# Patient Record
Sex: Female | Born: 1935 | Hispanic: No | Smoking: Never smoker
Health system: Southern US, Community
[De-identification: ages and names within clinical notes are randomized; demographics above are authoritative.]

## PROBLEM LIST (undated history)

## (undated) DIAGNOSIS — M5430 Sciatica, unspecified side: Secondary | ICD-10-CM

## (undated) DIAGNOSIS — E560 Deficiency of vitamin E: Secondary | ICD-10-CM

## (undated) DIAGNOSIS — K5909 Other constipation: Secondary | ICD-10-CM

## (undated) DIAGNOSIS — I214 Non-ST elevation (NSTEMI) myocardial infarction: Secondary | ICD-10-CM

## (undated) DIAGNOSIS — I10 Essential (primary) hypertension: Secondary | ICD-10-CM

## (undated) DIAGNOSIS — C189 Malignant neoplasm of colon, unspecified: Secondary | ICD-10-CM

## (undated) DIAGNOSIS — H269 Unspecified cataract: Secondary | ICD-10-CM

## (undated) DIAGNOSIS — Z66 Do not resuscitate: Secondary | ICD-10-CM

## (undated) DIAGNOSIS — G4762 Sleep related leg cramps: Secondary | ICD-10-CM

## (undated) DIAGNOSIS — E538 Deficiency of other specified B group vitamins: Secondary | ICD-10-CM

## (undated) DIAGNOSIS — I059 Rheumatic mitral valve disease, unspecified: Secondary | ICD-10-CM

## (undated) DIAGNOSIS — I5032 Chronic diastolic (congestive) heart failure: Secondary | ICD-10-CM

## (undated) DIAGNOSIS — N183 Chronic kidney disease, stage 3 unspecified: Secondary | ICD-10-CM

## (undated) DIAGNOSIS — C569 Malignant neoplasm of unspecified ovary: Secondary | ICD-10-CM

## (undated) DIAGNOSIS — E871 Hypo-osmolality and hyponatremia: Secondary | ICD-10-CM

## (undated) DIAGNOSIS — E785 Hyperlipidemia, unspecified: Secondary | ICD-10-CM

## (undated) DIAGNOSIS — R609 Edema, unspecified: Secondary | ICD-10-CM

## (undated) DIAGNOSIS — M47817 Spondylosis without myelopathy or radiculopathy, lumbosacral region: Secondary | ICD-10-CM

## (undated) DIAGNOSIS — I4891 Unspecified atrial fibrillation: Secondary | ICD-10-CM

## (undated) DIAGNOSIS — M545 Low back pain, unspecified: Secondary | ICD-10-CM

## (undated) DIAGNOSIS — Z85528 Personal history of other malignant neoplasm of kidney: Secondary | ICD-10-CM

## (undated) DIAGNOSIS — D696 Thrombocytopenia, unspecified: Secondary | ICD-10-CM

## (undated) DIAGNOSIS — E559 Vitamin D deficiency, unspecified: Secondary | ICD-10-CM

## (undated) DIAGNOSIS — M1990 Unspecified osteoarthritis, unspecified site: Secondary | ICD-10-CM

## (undated) DIAGNOSIS — M8580 Other specified disorders of bone density and structure, unspecified site: Secondary | ICD-10-CM

## (undated) DIAGNOSIS — R32 Unspecified urinary incontinence: Secondary | ICD-10-CM

## (undated) DIAGNOSIS — H9190 Unspecified hearing loss, unspecified ear: Secondary | ICD-10-CM

## (undated) DIAGNOSIS — R209 Unspecified disturbances of skin sensation: Secondary | ICD-10-CM

## (undated) HISTORY — DX: Unspecified cataract: H26.9

## (undated) HISTORY — DX: Essential (primary) hypertension: I10

## (undated) HISTORY — DX: Vitamin D deficiency, unspecified: E55.9

## (undated) HISTORY — DX: Morbid (severe) obesity due to excess calories: E66.01

## (undated) HISTORY — DX: Unspecified urinary incontinence: R32

## (undated) HISTORY — DX: Unspecified disturbances of skin sensation: R20.9

## (undated) HISTORY — DX: Edema, unspecified: R60.9

## (undated) HISTORY — DX: Hypo-osmolality and hyponatremia: E87.1

## (undated) HISTORY — DX: Malignant neoplasm of unspecified ovary: C56.9

## (undated) HISTORY — DX: Unspecified osteoarthritis, unspecified site: M19.90

## (undated) HISTORY — DX: Hyperlipidemia, unspecified: E78.5

## (undated) HISTORY — DX: Chronic kidney disease, stage 3 (moderate): N18.3

## (undated) HISTORY — DX: Chronic diastolic (congestive) heart failure: I50.32

## (undated) HISTORY — DX: Sleep related leg cramps: G47.62

## (undated) HISTORY — DX: Thrombocytopenia, unspecified: D69.6

## (undated) HISTORY — DX: Low back pain: M54.5

## (undated) HISTORY — DX: Chronic kidney disease, stage 3 unspecified: N18.30

## (undated) HISTORY — DX: Spondylosis without myelopathy or radiculopathy, lumbosacral region: M47.817

## (undated) HISTORY — DX: Malignant neoplasm of colon, unspecified: C18.9

## (undated) HISTORY — DX: Low back pain, unspecified: M54.50

## (undated) HISTORY — DX: Personal history of other malignant neoplasm of kidney: Z85.528

## (undated) HISTORY — DX: Other specified disorders of bone density and structure, unspecified site: M85.80

## (undated) HISTORY — DX: Unspecified hearing loss, unspecified ear: H91.90

## (undated) HISTORY — DX: Unspecified atrial fibrillation: I48.91

## (undated) HISTORY — DX: Deficiency of vitamin E: E56.0

## (undated) HISTORY — DX: Sciatica, unspecified side: M54.30

## (undated) HISTORY — DX: Rheumatic mitral valve disease, unspecified: I05.9

## (undated) HISTORY — DX: Deficiency of other specified B group vitamins: E53.8

## (undated) HISTORY — DX: Non-ST elevation (NSTEMI) myocardial infarction: I21.4

## (undated) HISTORY — DX: Other constipation: K59.09

## (undated) HISTORY — PX: ANKLE SURGERY: SHX546

## (undated) HISTORY — DX: Do not resuscitate: Z66

---
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
## 1959-09-15 DIAGNOSIS — C189 Malignant neoplasm of colon, unspecified: Secondary | ICD-10-CM

## 1959-09-15 HISTORY — DX: Malignant neoplasm of colon, unspecified: C18.9

## 1968-09-14 DIAGNOSIS — C569 Malignant neoplasm of unspecified ovary: Secondary | ICD-10-CM

## 1968-09-14 HISTORY — DX: Malignant neoplasm of unspecified ovary: C56.9

## 2001-10-24 ENCOUNTER — Other Ambulatory Visit: Admission: RE | Admit: 2001-10-24 | Discharge: 2001-10-24 | Payer: Self-pay | Admitting: Family Medicine

## 2003-09-15 DIAGNOSIS — Z85528 Personal history of other malignant neoplasm of kidney: Secondary | ICD-10-CM

## 2003-09-15 HISTORY — DX: Personal history of other malignant neoplasm of kidney: Z85.528

## 2011-04-22 ENCOUNTER — Encounter: Payer: Self-pay | Admitting: Internal Medicine

## 2011-04-22 ENCOUNTER — Ambulatory Visit (INDEPENDENT_AMBULATORY_CARE_PROVIDER_SITE_OTHER): Payer: Medicare Other | Admitting: Internal Medicine

## 2011-04-22 VITALS — BP 151/85 | HR 62 | Ht 65.0 in | Wt 193.0 lb

## 2011-04-22 DIAGNOSIS — R0609 Other forms of dyspnea: Secondary | ICD-10-CM

## 2011-04-22 DIAGNOSIS — R0683 Snoring: Secondary | ICD-10-CM | POA: Insufficient documentation

## 2011-04-22 DIAGNOSIS — R079 Chest pain, unspecified: Secondary | ICD-10-CM | POA: Insufficient documentation

## 2011-04-22 DIAGNOSIS — I480 Paroxysmal atrial fibrillation: Secondary | ICD-10-CM | POA: Insufficient documentation

## 2011-04-22 DIAGNOSIS — I4891 Unspecified atrial fibrillation: Secondary | ICD-10-CM

## 2011-04-22 MED ORDER — WARFARIN SODIUM 2.5 MG PO TABS: 2.5000 mg | ORAL_TABLET | Freq: Every day | ORAL | Status: DC

## 2011-04-22 MED ORDER — FLECAINIDE ACETATE 100 MG PO TABS: 100.0000 mg | ORAL_TABLET | Freq: Two times a day (BID) | ORAL | Status: DC

## 2011-04-22 MED ORDER — PANTOPRAZOLE SODIUM 40 MG PO TBEC: 40.0000 mg | DELAYED_RELEASE_TABLET | Freq: Every day | ORAL | Status: AC

## 2011-04-22 NOTE — Progress Notes (Signed)
HPI:  Ms. [name] is an 77 y/o woman with multiple medical problems including morbid obesity, HTN, CRI, severe hearing loss and h/o colon/kidney/ovarian CA all treated successfully with surgery. Referred by Dr. Nathanial Rancher for evaluation of new-onset AF.   Denies any h/o heart disease.  Saw Dr. Nathanial Rancher last week for sinus infection. At that time c/o palpitations and CP. ECG in office was SR. However, she had a 2 week event monitor placed yesterday and was found to have atrial fibrillation with RVR at rates ~150.   Gets around slowly with walker. Still with occasional pain in her left chest/shoulders/back. Continues with palpitations 1-2x/day. Associated with dyspnea. Snores very heavily at night. Daughter says she stops breathing frequently.   Has h/o heavy Goody Powder use for shoulder and back pain. Dr. Nathanial Rancher told her to stop taking it. Has not had h/o GIB. Switched to hydrocodone.    ROS: All other systems normal except as mentioned in HPI, past medical history and problem list.    Past Medical History  Diagnosis Date  . Thrombocytopenia   . Sleep related leg cramps   . Cough   . Unspecified hearing loss   . HTN (hypertension)   . Lumbar pain   . Osteopenia   . CKD (chronic kidney disease), stage III   . Chronic constipation   . Unspecified urinary incontinence   . Hyponatremia   . Mitral valve disorders   . Disturbance of skin sensation   . Sciatica   . Unspecified cataract   . Degeneration of intervertebral disc, site unspecified   . Lumbosacral spondylosis without myelopathy   . Edema   . HLD (hyperlipidemia)   . Other B-complex deficiencies   . Unspecified vitamin D deficiency   . Palpitations   . Dizziness and giddiness     Current Outpatient Prescriptions  Medication Sig Dispense Refill  . acetaminophen (TYLENOL) 500 MG tablet Take 500 mg by mouth every 6 (six) hours as needed.        Marland Kitchen alendronate (FOSAMAX) 10 MG tablet Take 10 mg by mouth daily before breakfast. Take  with a full glass of water on an empty stomach.       Marland Kitchen amoxicillin (AMOXIL) 500 MG capsule Take 500 mg by mouth 2 (two) times daily.        Marland Kitchen aspirin 81 MG tablet Take 81 mg by mouth daily.        . calcium-vitamin D (OSCAL WITH D) 500-200 MG-UNIT per tablet Take 1 tablet by mouth daily.        . Cholecalciferol (VITAMIN D) 400 UNITS capsule Take 400 Units by mouth daily.        Marland Kitchen docusate sodium (COLACE) 100 MG capsule Take 100 mg by mouth daily.        . fenofibrate micronized (LOFIBRA) 134 MG capsule Take 134 mg by mouth daily before breakfast.        . furosemide (LASIX) 20 MG tablet Take 20 mg by mouth daily.        Marland Kitchen HYDROcodone-acetaminophen (VICODIN) 5-500 MG per tablet Take 1 tablet by mouth every 6 (six) hours as needed.        . loratadine (CLARITIN) 10 MG tablet Take 10 mg by mouth daily.        Marland Kitchen lovastatin (MEVACOR) 40 MG tablet Take 40 mg by mouth at bedtime.        . meclizine (ANTIVERT) 25 MG tablet Take 25 mg by mouth 3 (three) times daily as  needed.        . Omega-3 Fatty Acids (FISH OIL) 1200 MG CAPS Take by mouth 3 (three) times daily.        . sodium chloride (OCEAN) 0.65 % nasal spray Place 1 spray into the nose as needed.        . verapamil (COVERA HS) 240 MG (CO) 24 hr tablet Take 240 mg by mouth at bedtime.        . vitamin B-12 (CYANOCOBALAMIN) 500 MCG tablet Take 500 mcg by mouth daily.        . vitamin C (ASCORBIC ACID) 500 MG tablet Take 500 mg by mouth daily.           No Known Allergies  History   Social History  . Marital Status: Widowed    Spouse Name: N/A    Number of Children: N/A  . Years of Education: N/A   Occupational History  . Not on file.   Social History Main Topics  . Smoking status: Never Smoker   . Smokeless tobacco: Not on file  . Alcohol Use: Not on file  . Drug Use: Not on file  . Sexually Active: Not on file   Other Topics Concern  . Not on file   Social History Narrative  . No narrative on file    No family history on  file.  PHYSICAL EXAM: Filed Vitals:   04/22/11 1056  BP: 151/85  Pulse: 62   General:  Elderly obese sitting in [place]. Very hard of hearing HEENT: normal Neck: supple. no JVD. Carotids 2+ bilat; no bruits. No lymphadenopathy or thryomegaly appreciated. Cor: PMI nonpalpable Regular rate & rhythm. No rubs, murmurs. +s4 Lungs: clear Abdomen: obese soft, nontender, nondistended.No bruits or masses. Good bowel sounds. Extremities: no cyanosis, clubbing, rash, edema Neuro: alert & oriented x 3, cranial nerves grossly intact. moves all 4 extremities w/o difficulty. Affect pleasant.   ECG: Sinus rhythm 64. LAD. LVH IVCD No ST-T wave abnormalities.    ASSESSMENT & PLAN:

## 2011-04-22 NOTE — Assessment & Plan Note (Addendum)
Patient is with symptomatic PAF with tachy-brady component. Reviewed initial holter monitor results showing rapid vetricular response ~150.  Given tachy-brady will likely not tolerate rate control strategy. Discussed rhythm control with patient and daughter and agreed to start Flecainide 100 mg BID.  Will check echo to assess EF and LA size (suspect she has significant diastolic dysfunction). Will also check lexiscan myoview to evaluate for signifcant underlying CAD.  If myoview positive will likely need to chagne flecainide to amiodarone.  She has a CHADS-VASC 9 (age, gender, HTN) therefore she will need coumadin.  R/B/I discussed with pt and her daughter, they agreed to proceed with therapy.  Discussed importance of not using Goody powders with coumadin use, she voiced understanding. Will start Protonix to help protect against GI bleeding. New patient coumadin clinic will be scheduled.  Will follow up with EP for further treatment. Refer to pulmonary for sleep study as I suspect she has severe OSA.

## 2011-04-22 NOTE — Assessment & Plan Note (Signed)
Daughter states pt snores nightly and stops breathing regularly.  She will be schedule for pulmonary consult for sleep apnea review.

## 2011-04-22 NOTE — Patient Instructions (Addendum)
Start Coumadin 2.5 mg daily Start Flecainide 100 mg Twice daily  Start Protonix 40 mg daily  Your physician has requested that you have an echocardiogram. Echocardiography is a painless test that uses sound waves to create images of your heart. It provides your doctor with information about the size and shape of your heart and how well your heart's chambers and valves are working. This procedure takes approximately one hour. There are no restrictions for this procedure.  Your physician has requested that you have a lexiscan myoview. For further information please visit [email or web address] Please follow instruction sheet, as given.  You have been referred to Coumadin Clinic, need to be seen Mon 8/13  You have been referred to Pulmonary for Sleep Eval  You have been referred to EP for further follow-up

## 2011-04-22 NOTE — Assessment & Plan Note (Addendum)
Mostly atypical but has multiple cardiac risk factors. Unable to walk on treadmill due to comorbidities. Will schedule lexiscan myoview.

## 2011-04-23 ENCOUNTER — Encounter: Payer: Self-pay | Admitting: *Deleted

## 2011-04-24 ENCOUNTER — Encounter: Payer: Self-pay | Admitting: Pulmonary Disease

## 2011-04-24 ENCOUNTER — Ambulatory Visit (INDEPENDENT_AMBULATORY_CARE_PROVIDER_SITE_OTHER): Payer: Medicare Other | Admitting: Pulmonary Disease

## 2011-04-24 DIAGNOSIS — G4733 Obstructive sleep apnea (adult) (pediatric): Secondary | ICD-10-CM | POA: Insufficient documentation

## 2011-04-24 DIAGNOSIS — Z9989 Dependence on other enabling machines and devices: Secondary | ICD-10-CM

## 2011-04-24 NOTE — Progress Notes (Signed)
  Subjective:    Patient ID: [name], female    DOB: [birth year], 77 y.o.   MRN: [id number]  HPI 77 y/o woman referred for evaluation of obstructive sleep apnea  She has  HTN, CRI, severe hearing loss and h/o colon/kidney/ovarian CA all treated successfully with surgery. Seen by Dr Gala Romney for [name] now on flecainide & verapamil & coumadin Daughter reports snoring, witnessed apneas & starts back with little noises Hydrocodone at bedtime, nocturia +, takes lasix in am, sleeps in a recliner due to a bad back x 3 yrs ESS 10 She is on a lifewatch monitor x  2 weeks Surgery at [facility] for hearing implants - was told that she was a difficult airway Bedtime 10-11 pm, minimal latency, frequent arousals, nocturia +, wakes up at 0800, keeps a headache, no dryness.   Review of Systems  Constitutional: Positive for unexpected weight change. Negative for fever.  HENT: Positive for congestion, sneezing and dental problem. Negative for ear pain, nosebleeds, sore throat, rhinorrhea, trouble swallowing, postnasal drip and sinus pressure.   Eyes: Negative for redness and itching.  Respiratory: Positive for cough and shortness of breath. Negative for chest tightness and wheezing.   Cardiovascular: Positive for chest pain and leg swelling. Negative for palpitations.  Gastrointestinal: Negative for nausea and vomiting.  Genitourinary: Negative for dysuria.  Musculoskeletal: Positive for joint swelling.  Skin: Negative for rash.  Neurological: Negative for headaches.  Hematological: Does not bruise/bleed easily.  Psychiatric/Behavioral: Negative for dysphoric mood. The patient is nervous/anxious.        Objective:   Physical Exam  Gen. Pleasant, obese, in no distress, normal affect ENT - no lesions, no post nasal drip, class 2 airway Neck: No JVD, no thyromegaly, no carotid bruits Lungs: no use of accessory muscles, no dullness to percussion, clear without rales or rhonchi  Cardiovascular: Rhythm  regular, heart sounds  normal, no murmurs or gallops, no peripheral edema Abdomen: soft and non-tender, no hepatosplenomegaly, BS normal. Musculoskeletal: No deformities, no cyanosis or clubbing Neuro:  alert, non focal       Assessment & Plan:

## 2011-04-24 NOTE — Patient Instructions (Signed)
We will set you up for a sleep study

## 2011-04-24 NOTE — Assessment & Plan Note (Signed)
Given excessive daytime somnolence, narrow pharyngeal exam, witnessed apneas & loud snoring, obstructive sleep apnea is very likely & an overnight polysomnogram will be scheduled as a split study. The pathophysiology of obstructive sleep apnea , it's cardiovascular consequences & modes of treatment including CPAP were discused with the patient in detail & they evidenced understanding.  Chances of maintaing nSR are higher if OSa is concurrently treated. Of course, adjusting to CPAP becomes an issue in this age group.

## 2011-04-27 ENCOUNTER — Ambulatory Visit (INDEPENDENT_AMBULATORY_CARE_PROVIDER_SITE_OTHER): Payer: Medicare Other | Admitting: *Deleted

## 2011-04-27 ENCOUNTER — Encounter: Payer: Self-pay | Admitting: Internal Medicine

## 2011-04-27 DIAGNOSIS — Z7901 Long term (current) use of anticoagulants: Secondary | ICD-10-CM | POA: Insufficient documentation

## 2011-04-27 DIAGNOSIS — I480 Paroxysmal atrial fibrillation: Secondary | ICD-10-CM

## 2011-04-27 DIAGNOSIS — I4891 Unspecified atrial fibrillation: Secondary | ICD-10-CM

## 2011-04-27 LAB — POCT INR: INR: 2.3

## 2011-04-30 ENCOUNTER — Ambulatory Visit (HOSPITAL_COMMUNITY): Payer: Medicare Other | Attending: Internal Medicine | Admitting: Radiology

## 2011-04-30 ENCOUNTER — Ambulatory Visit (HOSPITAL_BASED_OUTPATIENT_CLINIC_OR_DEPARTMENT_OTHER): Payer: Medicare Other | Admitting: Radiology

## 2011-04-30 DIAGNOSIS — R42 Dizziness and giddiness: Secondary | ICD-10-CM | POA: Insufficient documentation

## 2011-04-30 DIAGNOSIS — R079 Chest pain, unspecified: Secondary | ICD-10-CM | POA: Insufficient documentation

## 2011-04-30 DIAGNOSIS — I4891 Unspecified atrial fibrillation: Secondary | ICD-10-CM | POA: Insufficient documentation

## 2011-04-30 DIAGNOSIS — I480 Paroxysmal atrial fibrillation: Secondary | ICD-10-CM

## 2011-04-30 DIAGNOSIS — E669 Obesity, unspecified: Secondary | ICD-10-CM | POA: Insufficient documentation

## 2011-04-30 DIAGNOSIS — G4733 Obstructive sleep apnea (adult) (pediatric): Secondary | ICD-10-CM | POA: Insufficient documentation

## 2011-04-30 DIAGNOSIS — R609 Edema, unspecified: Secondary | ICD-10-CM | POA: Insufficient documentation

## 2011-04-30 DIAGNOSIS — R0602 Shortness of breath: Secondary | ICD-10-CM

## 2011-04-30 DIAGNOSIS — Z87891 Personal history of nicotine dependence: Secondary | ICD-10-CM | POA: Insufficient documentation

## 2011-04-30 HISTORY — PX: OTHER SURGICAL HISTORY: SHX169

## 2011-04-30 MED ORDER — REGADENOSON 0.4 MG/5ML IV SOLN
0.4000 mg | Freq: Once | INTRAVENOUS | Status: AC
  Administered 2011-04-30: 0.4 mg via INTRAVENOUS

## 2011-04-30 MED ORDER — TECHNETIUM TC 99M TETROFOSMIN IV KIT
11.0000 | PACK | Freq: Once | INTRAVENOUS | Status: AC | PRN
  Administered 2011-04-30: 11 via INTRAVENOUS

## 2011-04-30 MED ORDER — TECHNETIUM TC 99M TETROFOSMIN IV KIT
33.0000 | PACK | Freq: Once | INTRAVENOUS | Status: AC | PRN
  Administered 2011-04-30: 33 via INTRAVENOUS

## 2011-04-30 NOTE — Progress Notes (Signed)
[facility] SITE 3 NUCLEAR MED [address] [place] [address] [phone]  Cardiology Nuclear Med Study  [name] is a 77 y.o. female [id number] May 17, 1927   Nuclear Med Background Indication for Stress Test:  Evaluation for Ischemia History:  No previous documented CAD Cardiac Risk Factors: Hypertension and Lipids  Symptoms:  Chest Pain, Dizziness, Palpitations and SOB   Nuclear Pre-Procedure Caffeine/Decaff Intake:  None NPO After: 4 pm   Lungs:  clear IV 0.9% NS with Angio Cath:  22g  IV Site: R Hand  IV Started by:  Bonnita Levan, RN  Chest Size (in):  42 Cup Size: C  Height: 5\' 5"  (1.651 m)  Weight:  193 lb (87.544 kg)  BMI:  Body mass index is 32.12 kg/(m^2). Tech Comments:  N/A    Nuclear Med Study 1 or 2 day study: 1 day  Stress Test Type:  Eugenie Birks  Reading MD: Kristeen Miss, MD  Order Authorizing Provider:  D.Bensimhon  Resting Radionuclide: Technetium 53m Tetrofosmin  Resting Radionuclide Dose: 11.0 mCi   Stress Radionuclide:  Technetium 29m Tetrofosmin  Stress Radionuclide Dose: 33.0 mCi           Stress Protocol Rest HR: 55 Stress HR: 70  Rest BP: 135/85 Stress BP: 164/81  Exercise Time (min): n/a METS: n/a   Predicted Max HR: 138 bpm % Max HR: 50.72 bpm Rate Pressure Product: 21308   Dose of Adenosine (mg):  n/a Dose of Lexiscan: 0.4 mg  Dose of Atropine (mg): n/a Dose of Dobutamine: n/a mcg/kg/min (at max HR)  Stress Test Technologist: Milana Na, EMT-P  Nuclear Technologist:  Domenic Polite, CNMT     Rest Procedure:  Myocardial perfusion imaging was performed at rest 45 minutes following the intravenous administration of Technetium 43m Tetrofosmin. Rest ECG: Sinus Bradycardia  Stress Procedure:  The patient received IV Lexiscan 0.4 mg over 15-seconds.  Technetium 75m Tetrofosmin injected at 30-seconds.  There were no significant changes, sob, chest heaviness, and rare pvcs with Lexiscan.  Quantitative spect images  were obtained after a 45 minute delay. Stress ECG: No significant change from baseline ECG  QPS Raw Data Images:  Normal; no motion artifact; normal heart/lung ratio. Stress Images:  Normal homogeneous uptake in all areas of the myocardium. Rest Images:  Normal homogeneous uptake in all areas of the myocardium. Subtraction (SDS):  No evidence of ischemia. Transient Ischemic Dilatation (Normal <1.22):  1.05 Lung/Heart Ratio (Normal <0.45):  0.29  Quantitative Gated Spect Images QGS EDV:  96 ml QGS ESV:  36 ml QGS cine images:  NL LV Function; NL Wall Motion QGS EF: 62%  Impression Exercise Capacity:  Lexiscan with no exercise. BP Response:  Normal blood pressure response. Clinical Symptoms:  No chest pain. ECG Impression:  No significant ST segment change suggestive of ischemia. Comparison with Prior Nuclear Study: No images to compare  Overall Impression:  Normal stress nuclear study.  No evidence of ischemia.  Normal LV function.    Vesta Mixer, Montez Hageman., MD, [facility]

## 2011-05-04 ENCOUNTER — Ambulatory Visit (INDEPENDENT_AMBULATORY_CARE_PROVIDER_SITE_OTHER): Payer: Medicare Other | Admitting: *Deleted

## 2011-05-04 DIAGNOSIS — I4891 Unspecified atrial fibrillation: Secondary | ICD-10-CM

## 2011-05-04 DIAGNOSIS — I480 Paroxysmal atrial fibrillation: Secondary | ICD-10-CM

## 2011-05-07 ENCOUNTER — Ambulatory Visit (HOSPITAL_BASED_OUTPATIENT_CLINIC_OR_DEPARTMENT_OTHER): Payer: Medicare Other | Attending: Pulmonary Disease

## 2011-05-07 DIAGNOSIS — Z79899 Other long term (current) drug therapy: Secondary | ICD-10-CM | POA: Insufficient documentation

## 2011-05-07 DIAGNOSIS — R259 Unspecified abnormal involuntary movements: Secondary | ICD-10-CM | POA: Insufficient documentation

## 2011-05-07 DIAGNOSIS — G4733 Obstructive sleep apnea (adult) (pediatric): Secondary | ICD-10-CM

## 2011-05-07 DIAGNOSIS — Z7901 Long term (current) use of anticoagulants: Secondary | ICD-10-CM | POA: Insufficient documentation

## 2011-05-07 DIAGNOSIS — I4891 Unspecified atrial fibrillation: Secondary | ICD-10-CM | POA: Insufficient documentation

## 2011-05-07 HISTORY — PX: OTHER SURGICAL HISTORY: SHX169

## 2011-05-11 ENCOUNTER — Ambulatory Visit (INDEPENDENT_AMBULATORY_CARE_PROVIDER_SITE_OTHER): Payer: Medicare Other | Admitting: *Deleted

## 2011-05-11 DIAGNOSIS — I4891 Unspecified atrial fibrillation: Secondary | ICD-10-CM

## 2011-05-11 DIAGNOSIS — I480 Paroxysmal atrial fibrillation: Secondary | ICD-10-CM

## 2011-05-11 LAB — POCT INR: INR: 6.6

## 2011-05-11 LAB — PROTIME-INR: Prothrombin Time: 50.1 seconds — ABNORMAL HIGH (ref 11.6–15.2)

## 2011-05-14 ENCOUNTER — Inpatient Hospital Stay (HOSPITAL_COMMUNITY)
Admission: EM | Admit: 2011-05-14 | Discharge: 2011-05-20 | DRG: 280 | Disposition: A | Discharge status: Home Health | Payer: Medicare Other | Attending: Internal Medicine | Admitting: Internal Medicine

## 2011-05-14 ENCOUNTER — Emergency Department (HOSPITAL_COMMUNITY): Payer: Medicare Other

## 2011-05-14 DIAGNOSIS — M47817 Spondylosis without myelopathy or radiculopathy, lumbosacral region: Secondary | ICD-10-CM | POA: Diagnosis present

## 2011-05-14 DIAGNOSIS — Z66 Do not resuscitate: Secondary | ICD-10-CM | POA: Diagnosis present

## 2011-05-14 DIAGNOSIS — N289 Disorder of kidney and ureter, unspecified: Secondary | ICD-10-CM | POA: Diagnosis not present

## 2011-05-14 DIAGNOSIS — H9190 Unspecified hearing loss, unspecified ear: Secondary | ICD-10-CM | POA: Diagnosis present

## 2011-05-14 DIAGNOSIS — E538 Deficiency of other specified B group vitamins: Secondary | ICD-10-CM | POA: Diagnosis present

## 2011-05-14 DIAGNOSIS — J9620 Acute and chronic respiratory failure, unspecified whether with hypoxia or hypercapnia: Secondary | ICD-10-CM | POA: Diagnosis present

## 2011-05-14 DIAGNOSIS — E662 Morbid (severe) obesity with alveolar hypoventilation: Secondary | ICD-10-CM | POA: Diagnosis present

## 2011-05-14 DIAGNOSIS — I4891 Unspecified atrial fibrillation: Secondary | ICD-10-CM | POA: Diagnosis present

## 2011-05-14 DIAGNOSIS — I2510 Atherosclerotic heart disease of native coronary artery without angina pectoris: Secondary | ICD-10-CM | POA: Diagnosis present

## 2011-05-14 DIAGNOSIS — I498 Other specified cardiac arrhythmias: Secondary | ICD-10-CM | POA: Diagnosis present

## 2011-05-14 DIAGNOSIS — Z8543 Personal history of malignant neoplasm of ovary: Secondary | ICD-10-CM

## 2011-05-14 DIAGNOSIS — Z7982 Long term (current) use of aspirin: Secondary | ICD-10-CM

## 2011-05-14 DIAGNOSIS — E876 Hypokalemia: Secondary | ICD-10-CM | POA: Diagnosis present

## 2011-05-14 DIAGNOSIS — K219 Gastro-esophageal reflux disease without esophagitis: Secondary | ICD-10-CM | POA: Diagnosis present

## 2011-05-14 DIAGNOSIS — D696 Thrombocytopenia, unspecified: Secondary | ICD-10-CM | POA: Diagnosis present

## 2011-05-14 DIAGNOSIS — I509 Heart failure, unspecified: Secondary | ICD-10-CM

## 2011-05-14 DIAGNOSIS — I5033 Acute on chronic diastolic (congestive) heart failure: Principal | ICD-10-CM | POA: Diagnosis present

## 2011-05-14 DIAGNOSIS — E785 Hyperlipidemia, unspecified: Secondary | ICD-10-CM | POA: Diagnosis present

## 2011-05-14 DIAGNOSIS — Z85528 Personal history of other malignant neoplasm of kidney: Secondary | ICD-10-CM

## 2011-05-14 DIAGNOSIS — R0902 Hypoxemia: Secondary | ICD-10-CM | POA: Diagnosis present

## 2011-05-14 DIAGNOSIS — J810 Acute pulmonary edema: Secondary | ICD-10-CM

## 2011-05-14 DIAGNOSIS — I214 Non-ST elevation (NSTEMI) myocardial infarction: Secondary | ICD-10-CM | POA: Diagnosis not present

## 2011-05-14 DIAGNOSIS — N183 Chronic kidney disease, stage 3 unspecified: Secondary | ICD-10-CM | POA: Diagnosis present

## 2011-05-14 DIAGNOSIS — J9600 Acute respiratory failure, unspecified whether with hypoxia or hypercapnia: Secondary | ICD-10-CM

## 2011-05-14 DIAGNOSIS — R079 Chest pain, unspecified: Secondary | ICD-10-CM

## 2011-05-14 DIAGNOSIS — E568 Deficiency of other vitamins: Secondary | ICD-10-CM | POA: Diagnosis present

## 2011-05-14 DIAGNOSIS — I129 Hypertensive chronic kidney disease with stage 1 through stage 4 chronic kidney disease, or unspecified chronic kidney disease: Secondary | ICD-10-CM | POA: Diagnosis present

## 2011-05-14 DIAGNOSIS — Z79899 Other long term (current) drug therapy: Secondary | ICD-10-CM

## 2011-05-14 DIAGNOSIS — G4733 Obstructive sleep apnea (adult) (pediatric): Secondary | ICD-10-CM | POA: Diagnosis present

## 2011-05-14 DIAGNOSIS — Z85038 Personal history of other malignant neoplasm of large intestine: Secondary | ICD-10-CM

## 2011-05-14 DIAGNOSIS — R0602 Shortness of breath: Secondary | ICD-10-CM

## 2011-05-14 DIAGNOSIS — Z7901 Long term (current) use of anticoagulants: Secondary | ICD-10-CM

## 2011-05-14 LAB — COMPREHENSIVE METABOLIC PANEL
ALT: 7 U/L (ref 0–35)
AST: 16 U/L (ref 0–37)
Albumin: 3.3 g/dL — ABNORMAL LOW (ref 3.5–5.2)
Alkaline Phosphatase: 37 U/L — ABNORMAL LOW (ref 39–117)
Chloride: 102 mEq/L (ref 96–112)
Potassium: 2.5 mEq/L — CL (ref 3.5–5.1)
Sodium: 143 mEq/L (ref 135–145)
Total Bilirubin: 0.9 mg/dL (ref 0.3–1.2)
Total Protein: 6.9 g/dL (ref 6.0–8.3)

## 2011-05-14 LAB — URINALYSIS, ROUTINE W REFLEX MICROSCOPIC
Bilirubin Urine: NEGATIVE
Glucose, UA: NEGATIVE mg/dL
Ketones, ur: NEGATIVE mg/dL
Protein, ur: 100 mg/dL — AB
pH: 7 (ref 5.0–8.0)

## 2011-05-14 LAB — POCT I-STAT TROPONIN I
Troponin i, poc: 0.07 ng/mL (ref 0.00–0.08)
Troponin i, poc: 0.12 ng/mL (ref 0.00–0.08)

## 2011-05-14 LAB — CBC
Hemoglobin: 13.8 g/dL (ref 12.0–15.0)
MCH: 29.2 pg (ref 26.0–34.0)
Platelets: UNDETERMINED 10*3/uL (ref 150–400)
RBC: 4.73 MIL/uL (ref 3.87–5.11)
WBC: 9.1 10*3/uL (ref 4.0–10.5)

## 2011-05-14 LAB — PHOSPHORUS: Phosphorus: 2.3 mg/dL (ref 2.3–4.6)

## 2011-05-14 LAB — LACTIC ACID, PLASMA: Lactic Acid, Venous: 2.2 mmol/L (ref 0.5–2.2)

## 2011-05-14 LAB — CK TOTAL AND CKMB (NOT AT ARMC)
CK, MB: 2.7 ng/mL (ref 0.3–4.0)
Total CK: 60 U/L (ref 7–177)

## 2011-05-14 LAB — BASIC METABOLIC PANEL
BUN: 13 mg/dL (ref 6–23)
CO2: 27 mEq/L (ref 19–32)
CO2: 29 mEq/L (ref 19–32)
Calcium: 8.7 mg/dL (ref 8.4–10.5)
Chloride: 101 mEq/L (ref 96–112)
Creatinine, Ser: 1.21 mg/dL — ABNORMAL HIGH (ref 0.50–1.10)
Glucose, Bld: 151 mg/dL — ABNORMAL HIGH (ref 70–99)
Glucose, Bld: 107 mg/dL — ABNORMAL HIGH (ref 70–99)
Sodium: 140 mEq/L (ref 135–145)

## 2011-05-14 LAB — PRO B NATRIURETIC PEPTIDE: Pro B Natriuretic peptide (BNP): 5091 pg/mL — ABNORMAL HIGH (ref 0–450)

## 2011-05-14 LAB — URINE MICROSCOPIC-ADD ON

## 2011-05-14 LAB — MAGNESIUM: Magnesium: 2 mg/dL (ref 1.5–2.5)

## 2011-05-14 LAB — GLUCOSE, CAPILLARY: Glucose-Capillary: 119 mg/dL — ABNORMAL HIGH (ref 70–99)

## 2011-05-14 LAB — POCT I-STAT 3, ART BLOOD GAS (G3+)
Bicarbonate: 26.5 mEq/L — ABNORMAL HIGH (ref 20.0–24.0)
O2 Saturation: 99 %
Patient temperature: 98.6
TCO2: 28 mmol/L (ref 0–100)
pH, Arterial: 7.429 — ABNORMAL HIGH (ref 7.350–7.400)

## 2011-05-14 LAB — PROTIME-INR
INR: 3.29 — ABNORMAL HIGH (ref 0.00–1.49)
Prothrombin Time: 34 seconds — ABNORMAL HIGH (ref 11.6–15.2)

## 2011-05-14 LAB — TROPONIN I: Troponin I: <0.3 ng/mL (ref <0.30)

## 2011-05-15 ENCOUNTER — Inpatient Hospital Stay (HOSPITAL_COMMUNITY): Payer: Medicare Other

## 2011-05-15 DIAGNOSIS — I4891 Unspecified atrial fibrillation: Secondary | ICD-10-CM

## 2011-05-15 LAB — GLUCOSE, CAPILLARY
Glucose-Capillary: 101 mg/dL — ABNORMAL HIGH (ref 70–99)
Glucose-Capillary: 103 mg/dL — ABNORMAL HIGH (ref 70–99)
Glucose-Capillary: 108 mg/dL — ABNORMAL HIGH (ref 70–99)
Glucose-Capillary: 109 mg/dL — ABNORMAL HIGH (ref 70–99)

## 2011-05-15 LAB — CBC
Hemoglobin: 11.9 g/dL — ABNORMAL LOW (ref 12.0–15.0)
MCH: 28.4 pg (ref 26.0–34.0)
MCHC: 32.3 g/dL (ref 30.0–36.0)
RDW: 14 % (ref 11.5–15.5)

## 2011-05-15 LAB — BASIC METABOLIC PANEL
Calcium: 8.4 mg/dL (ref 8.4–10.5)
Chloride: 105 mEq/L (ref 96–112)
Creatinine, Ser: 1.12 mg/dL — ABNORMAL HIGH (ref 0.50–1.10)
GFR calc Af Amer: 56 mL/min — ABNORMAL LOW (ref >60)
Sodium: 143 mEq/L (ref 135–145)

## 2011-05-15 LAB — CARDIAC PANEL(CRET KIN+CKTOT+MB+TROPI)
CK, MB: 6 ng/mL — ABNORMAL HIGH (ref 0.3–4.0)
Relative Index: INVALID (ref 0.0–2.5)
Troponin I: 1.1 ng/mL (ref <0.30)

## 2011-05-15 LAB — URINE CULTURE: Culture  Setup Time: 201208301728

## 2011-05-15 LAB — PROTIME-INR: Prothrombin Time: 30.8 seconds — ABNORMAL HIGH (ref 11.6–15.2)

## 2011-05-16 ENCOUNTER — Inpatient Hospital Stay (HOSPITAL_COMMUNITY): Payer: Medicare Other

## 2011-05-16 LAB — BASIC METABOLIC PANEL
BUN: 18 mg/dL (ref 6–23)
BUN: 20 mg/dL (ref 6–23)
Calcium: 9.7 mg/dL (ref 8.4–10.5)
GFR calc Af Amer: 53 mL/min — ABNORMAL LOW (ref >60)
GFR calc non Af Amer: 43 mL/min — ABNORMAL LOW (ref >60)
GFR calc non Af Amer: 41 mL/min — ABNORMAL LOW (ref >60)
Glucose, Bld: 141 mg/dL — ABNORMAL HIGH (ref 70–99)
Potassium: 3.3 mEq/L — ABNORMAL LOW (ref 3.5–5.1)
Sodium: 141 mEq/L (ref 135–145)
Sodium: 135 mEq/L (ref 135–145)

## 2011-05-16 LAB — GLUCOSE, CAPILLARY

## 2011-05-16 LAB — CK TOTAL AND CKMB (NOT AT ARMC): CK, MB: 5.4 ng/mL — ABNORMAL HIGH (ref 0.3–4.0)

## 2011-05-16 LAB — CBC
HCT: 39.2 % (ref 36.0–46.0)
MCHC: 32.4 g/dL (ref 30.0–36.0)
RDW: 14.1 % (ref 11.5–15.5)

## 2011-05-16 LAB — PROTIME-INR: Prothrombin Time: 28.3 seconds — ABNORMAL HIGH (ref 11.6–15.2)

## 2011-05-17 LAB — BASIC METABOLIC PANEL
Calcium: 9.5 mg/dL (ref 8.4–10.5)
Creatinine, Ser: 1.21 mg/dL — ABNORMAL HIGH (ref 0.50–1.10)
GFR calc Af Amer: 52 mL/min — ABNORMAL LOW (ref >60)
GFR calc non Af Amer: 43 mL/min — ABNORMAL LOW (ref >60)
Sodium: 137 mEq/L (ref 135–145)

## 2011-05-17 LAB — PROTIME-INR
INR: 2.94 — ABNORMAL HIGH (ref 0.00–1.49)
Prothrombin Time: 31.1 seconds — ABNORMAL HIGH (ref 11.6–15.2)

## 2011-05-17 LAB — CBC
HCT: 38 % (ref 36.0–46.0)
Hemoglobin: 12.5 g/dL (ref 12.0–15.0)
MCH: 29.6 pg (ref 26.0–34.0)
MCV: 89.8 fL (ref 78.0–100.0)
Platelets: UNDETERMINED 10*3/uL (ref 150–400)
RBC: 4.23 MIL/uL (ref 3.87–5.11)
RDW: 14.1 % (ref 11.5–15.5)

## 2011-05-18 LAB — BASIC METABOLIC PANEL
BUN: 24 mg/dL — ABNORMAL HIGH (ref 6–23)
Calcium: 9.9 mg/dL (ref 8.4–10.5)
Chloride: 98 mEq/L (ref 96–112)
Creatinine, Ser: 1.52 mg/dL — ABNORMAL HIGH (ref 0.50–1.10)
GFR calc Af Amer: 40 mL/min — ABNORMAL LOW (ref >60)

## 2011-05-18 LAB — PROTIME-INR
INR: 2.27 — ABNORMAL HIGH (ref 0.00–1.49)
Prothrombin Time: 25.4 seconds — ABNORMAL HIGH (ref 11.6–15.2)

## 2011-05-19 LAB — CBC
Hemoglobin: 12.1 g/dL (ref 12.0–15.0)
MCHC: 33.1 g/dL (ref 30.0–36.0)
RDW: 14 % (ref 11.5–15.5)

## 2011-05-19 LAB — BASIC METABOLIC PANEL
Calcium: 10.1 mg/dL (ref 8.4–10.5)
Creatinine, Ser: 1.54 mg/dL — ABNORMAL HIGH (ref 0.50–1.10)
GFR calc Af Amer: 39 mL/min — ABNORMAL LOW (ref >60)

## 2011-05-19 LAB — PROTIME-INR
INR: 1.97 — ABNORMAL HIGH (ref 0.00–1.49)
Prothrombin Time: 22.8 seconds — ABNORMAL HIGH (ref 11.6–15.2)

## 2011-05-19 LAB — TSH: TSH: 0.853 u[IU]/mL (ref 0.350–4.500)

## 2011-05-20 DIAGNOSIS — G4737 Central sleep apnea in conditions classified elsewhere: Secondary | ICD-10-CM

## 2011-05-20 DIAGNOSIS — J9600 Acute respiratory failure, unspecified whether with hypoxia or hypercapnia: Secondary | ICD-10-CM

## 2011-05-20 LAB — CULTURE, BLOOD (ROUTINE X 2)
Culture  Setup Time: 201208302104
Culture: NO GROWTH

## 2011-05-20 LAB — BASIC METABOLIC PANEL
BUN: 23 mg/dL (ref 6–23)
CO2: 33 mEq/L — ABNORMAL HIGH (ref 19–32)
Chloride: 99 mEq/L (ref 96–112)
Creatinine, Ser: 1.59 mg/dL — ABNORMAL HIGH (ref 0.50–1.10)
Glucose, Bld: 103 mg/dL — ABNORMAL HIGH (ref 70–99)

## 2011-05-20 NOTE — Progress Notes (Signed)
Pt admitted to hospital 8/30 [facility]

## 2011-05-20 NOTE — Procedures (Signed)
[name], [name] NO.:  [id number]  MEDICAL RECORD NO.:  [id number]          PATIENT TYPE:  OUT  LOCATION:  SLEEP CENTER                 FACILITY:  [facility]  PHYSICIAN:  Oretha Milch, MD      DATE OF BIRTH:  1927/05/17  DATE OF STUDY:  05/07/2011                           NOCTURNAL POLYSOMNOGRAM  REFERRING PHYSICIAN:  Oretha Milch, MD  INDICATION FOR STUDY:  [name] is an 77 year old woman with new-onset atrial fibrillation, loud snoring, witnessed apneas.  She sleeps in a recliner for many years due to a bad back.  At the time of this study, she weighed 194 pounds with a height of 5 feet and 5 inches, BMI of 32, neck size of 15 inches.  EPWORTH SLEEPINESS SCORE:  8.  MEDICATIONS:  Included Tambocor, Lasix, Vicodin, Claritin, meclizine, Protonix, verapamil, warfarin, Mevacor.  This nocturnal polysomnogram was performed with a sleep technologist in attendance.  EEG, EOG, EMG, EKG, and respiratory parameters were recorded.  Sleep stages arousals, limb movement, and respiratory data were scored according to criteria laid out by the American Academy of Sleep Medicine.  SLEEP ARCHITECTURE:  Lights out was at 10:29 p.m., lights on was at 4:50 a.m.  Total sleep time was 222 minutes with a sleep period time of 331 minutes and sleep efficiency of 58%.  Sleep latency was 10 minutes. Sleep latency to REM sleep was 85 minutes and awake after sleep onset was 151 minutes.  Sleep stages as the percentage of total sleep time was N1 9%, N2 73%, N3 0.2%, and REM sleep 18% (40 minutes).  She slept in a recliner as she does at home.  AROUSAL DATA:  There were 30 arousals with an arousal index of 8 events per hour of these 21 were spontaneous and the rest were associated with respiratory events.  RESPIRATORY DATA:  There were 0 obstructive apneas, 0 central apneas, 0 mixed apneas, and 39 hypopneas with an apnea/hypopnea index of 10 events per hour.  16 RERAs were noted  and RDI of 15 events per hour.  Most of these events were noted during REM sleep.  The longest hypopnea was 50 seconds.  OXYGEN DATA:  The desaturation index was 11 events per hour.  The lowest desaturation was 79% during REM sleep.  She spent 21 minutes with a saturation less than 88%.  CARDIAC DATA:  The low heart rate was 30 beats per minute.  The high heart rate was 100 beats per minute.  No arrhythmias were noted.  DISCUSSION:  She was desensitized with a small full-face mask, but did not meet criteria for split night intervention.  Events were predominantly noted during REM sleep.  MOVEMENT-PARASOMNIA:  The limb movement index was 41 events per hour. Limb movement arousal index however was 1.1 events per hour.  IMPRESSIONS-RECOMMENDATIONS: 1. Mild obstructive sleep apnea with predominant hypopnea during REM     sleep causing sleep fragmentation and oxygen desaturation. 2. Quite a few periodic limb movements were noted, however, they were     not associated with arousals, the significance of this is unclear. 3. No evidence of cardiac arrhythmias or behavioral disturbance during  sleep.  RECOMMENDATIONS: 1. Treatment options for this degree of sleep disordered breathing     include weight loss or oral appliance or CPAP therapy. 2. Alternatively, no therapy or oxygen therapy can also be considered     given the mild nature of this sleep disordered breathing. 3. The significance of limb movements during sleep is unclear.  Please     correlate with a clinical history of restless legs syndrome. 4. She should be cautioned against driving when sleepy.  She should be     advised against medications with sedative side effects.     Oretha Milch, MD Electronically Signed    RVA/MEDQ  D:  05/20/2011 14:43:51  T:  05/20/2011 22:50:32  Job:  [id number]

## 2011-05-21 ENCOUNTER — Encounter: Payer: Medicare Other | Admitting: *Deleted

## 2011-05-21 ENCOUNTER — Telehealth: Payer: Self-pay | Admitting: Internal Medicine

## 2011-05-21 NOTE — H&P (Addendum)
[name], [name] NO.:  [id number]  MEDICAL RECORD NO.:  [id number]  LOCATION:  2101                         FACILITY:  [facility]  PHYSICIAN:  Hillis Range, MD       DATE OF BIRTH:  09/28/1926  DATE OF ADMISSION:  05/14/2011 DATE OF DISCHARGE:                             HISTORY & PHYSICAL   CHIEF COMPLAINT:  Shortness of breath and chest pain.  HISTORY OF PRESENT ILLNESS:  [name] is an 77 year old female with a history of chronic renal insufficiency, hypertension, morbid obesity who was recently evaluated by Dr. Gala Romney for atrial fibrillation with RVR.  She had an echocardiogram August 16 showing normal LV function with an EF of 55-60 with mild MR and a PA pressure of 60 mmHg.  She had a negative Myoview Lexiscan May 07, 2011, with an EF of 62%.  She was started on flecainide and also continued on her verapamil and initiated on a Coumadin as well.  Her INR is supratherapeutic at 3.29 today.  The patient has had intermittent chest pain and shortness of breath on and off for several days for a very brief periods of time, the last night had an absolutely terrible night with these symptoms.  She had pretty much constant chest discomfort and shortness of breath for approximately 7 hours at 11:00 p.m. until 6 a.m.  She was nauseated and diaphoretic. She denies any palpitations or syncope.  She lives a very sedentary lifestyle but does get low-grade chronic dyspnea on exertion.  Her O2 sats on admission were approximately 92% on room air.  Cardiac enzymes are negative x1.  Potassium was noted to be 2.5.  She is on Lasix at home.  Her daughter reports decreased p.o. intake secondary to slow appetite lately.  PAST MEDICAL HISTORY: 1. Atrial fibrillation, recently diagnosed, initiated on a Coumadin     and flecainide. 2. Hypertension. 3. Morbid obesity. 4. Chronic renal insufficiency, stage III. 5. Hearing loss. 6. Colon/kidney/ovarian cancer status post  surgery. 7. Thrombocytopenia. 8. Degenerative joint disease/lumbar sacral spondylosis. 9. Hyperlipidemia. 10.Vitamin E and B12 deficiency.  1. A 2-D echocardiogram April 30, 2011, demonstrated a normal EF of     55-60 with no wall motion abnormalities.  Mild MR.  PA pressure is     60 mmHg. 2. Negative Myoview stress test for ischemia or infarction on May 07, 2011, with EF of 62%.  MEDICATIONS: 1. Fish oil 1200 mg t.i.d. 2. Meclizine p.r.n. 3. Lovastatin 40 mg nightly. 4. Flecainide 100 mg b.i.d. 5. Coumadin. 6. Protonix 40 mg daily. 7. Alendronate. 8. Aspirin 81 mg daily. 9. Os-Cal. 10.Vitamin D 40 units daily. 11.Colace 100 mg daily. 12.Lofibra 134 mg daily. 13.Lasix 20 mg daily. 14.Claritin 10 mg daily. 15.Verapamil 240 mg nightly. 16.Vitamin C 500 mg daily. 17.Vitamin D 500 mcg daily.  In the ER thus far she has received 40 mEq of potassium with a 10 mEq run as well as aspirin and nitro paste.  ALLERGIES:  No known drug allergies.  SOCIAL HISTORY:  [name] is widowed.  She lives with her daughter. She has 3 children in total.  She denies any  tobacco or alcohol use.  FAMILY HISTORY:  Negative for coronary artery disease.  REVIEW OF SYSTEMS:  No fevers, chills.  She has had diaphoresis, as well as nausea.  No bright red blood per rectum, melena or hematemesis.  All other systems reviewed and otherwise negative.  LABORATORY FINDINGS:  WBC 9.1, hemoglobin 13.8, hematocrit 41.1, platelet count are clumped.  Sodium 143, potassium 2.5, chloride 102, CO2 31, glucose 107, BUN 12, creatinine 1.10.  Cardiac enzymes negative x1.  LFTs okay with decreased of alka phos and decreased albumin which is 3.3, INR 3.29.  EKG, normal sinus rhythm with a wide QRS right up to left bundle branch block, prolonged QTC and rate of 94 beats per minute.  RADIOLOGIC STUDIES:  Mild to moderate cardiomegaly.  Bibasilar atelectasis.  Small pleural effusions right greater than  left.  PHYSICAL EXAMINATION:  VITAL SIGNS:  Temperature 97, pulse 56, respirations 18, blood pressure 174/66, pulse ox 97% on 2 L, she was 92% on room air on admission. GENERAL:  This is a very hard-of-hearing obese elderly [name] female in no acute distress. HEENT:  Normocephalic, atraumatic with extraocular movements intact. Clear sclerae.  Nares are without discharge. NECK:  Supple with difficult to assess JVD given body habitus. CARDIAC:  Auscultation to the heart reveals regular rate and rhythm with S1, S2 without murmurs, rubs or gallops. LUNGS:  Clear but decreased breath sounds at the bases. ABDOMEN:  Soft, nontender, nondistended.  Positive bowel sounds.  No rebound or guarding. EXTREMITIES:  Warm, dry without edema.  She has 2+ pedal pulses bilaterally. NEUROLOGIC:  She is alert and oriented x3 and responds to questions appropriately with a normal affect.  She is hard of hearing.  ASSESSMENT/PLAN:  The patient was seen and examined by Dr. Johney Frame and myself.  This is an 77 year old female with hypertension, morbid obesity, normal left ventricular function and a recently negative Myoview which were done after newly-diagnosed atrial fibrillation with rapid ventricular response in the past several weeks.  She has been placed on flecainide for rhythm control as well as Coumadin.  She presented with 7 hours of chest discomfort and shortness of breath as well as nausea and diaphoresis.  Initially she was felt to be stable in the ER, but after her initial evaluation, the patient developed acute respiratory distress with hypoxia requiring increased oxygen.  Pulmonary Critical Care has been called.  We have initiated a stat neb treatment. We have also ordered a dose of IV Lasix 40 mg as well as potassium.  We suspect that her EKG may be in part due to her flecainide in the setting of hypokalemia.  We would recommend to aggressively replete this.  It is unclear the etiology of her  acute respiratory failure, but the patient will be discussed with Pulmonary Critical Care who has arrived.     Ronie Spies, P.A.C.   ______________________________ Hillis Range, MD    DD/MEDQ  D:  05/14/2011  T:  05/14/2011  Job:  [id number]  cc:   Bevelyn Buckles. Bensimhon, MD  Electronically Signed by Hillis Range MD on 05/21/2011 08:54:39 AM Electronically Signed by Ronie Spies  on 05/25/2011 07:37:10 PM

## 2011-05-21 NOTE — Telephone Encounter (Signed)
INR was 3.8 when she left the hospital on 05/20/11.  HH nurse is concerned because [name] was sent home on Lasix but no potassium.  She wants to know if she should check a potassium tomorrow along with the INR that is due.  Order is for potassium in one week but she is not comfortable waiting that long without verification from Dr Gala Romney.

## 2011-05-21 NOTE — Telephone Encounter (Signed)
Pt came home from hospital yesterday and nurse went out to see pt today. Pt had low potassium when pt went in hospital. Pt came home on lasix with no potassium supplement. Pt was scheduled to get PT INR (tommorrow at pt house) and nurse wanted to know if it is advised that the nurse do a BMP tomorrow as well. Please return call to advise/discuss.

## 2011-05-21 NOTE — Consult Note (Addendum)
  NAMEMARLANE, Ball NO.:  [id number]  MEDICAL RECORD NO.:  [id number]  LOCATION:  2101                         FACILITY:  [facility]  PHYSICIAN:  Hillis Range, MD       DATE OF BIRTH:  [birth year]  DATE OF CONSULTATION: DATE OF DISCHARGE:                                CONSULTATION   Please note that Pulmonary Critical Care will be admitting the patient for her acute respiratory failure.  She appears critically ill.  We would recommend to hold her flecainide and repeat her potassium.  The patient is unstable and not presently a catheterization candidate.  We are concerned for primary pulmonary process such as pneumonia.  She is also volume overloaded on our exam.  We will follow closely.     Ronie Spies, P.A.C.   ______________________________ Hillis Range, MD    DD/MEDQ  D:  05/14/2011  T:  05/14/2011  Job:  [id number]  cc:   Bevelyn Buckles. Bensimhon, MD  Electronically Signed by Hillis Range MD on 05/21/2011 08:54:42 AM Electronically Signed by Ronie Spies  on 05/25/2011 07:37:16 PM

## 2011-05-22 ENCOUNTER — Ambulatory Visit (INDEPENDENT_AMBULATORY_CARE_PROVIDER_SITE_OTHER): Payer: Self-pay | Admitting: Cardiovascular Disease

## 2011-05-22 ENCOUNTER — Ambulatory Visit: Payer: Medicare Other | Admitting: Pulmonary Disease

## 2011-05-22 DIAGNOSIS — R0989 Other specified symptoms and signs involving the circulatory and respiratory systems: Secondary | ICD-10-CM

## 2011-05-25 NOTE — Telephone Encounter (Signed)
i am just seeing this. Yes please check potassium ASAP.

## 2011-05-25 NOTE — Telephone Encounter (Signed)
HH nurse notified

## 2011-05-25 NOTE — Discharge Summary (Addendum)
[name], [name] NO.:  [id number]  MEDICAL RECORD NO.:  [id number]  LOCATION:  2029                         FACILITY:  [facility]  PHYSICIAN:  Duke Salvia, MD, FACCDATE OF BIRTH:  1927-07-07  DATE OF ADMISSION:  05/14/2011 DATE OF DISCHARGE:  05/20/2011                              DISCHARGE SUMMARY   DISCHARGE DIAGNOSES: 1. Acute respiratory failure. 2. Non-ST elevation myocardial infarction, question type 2 versus     silent ischemia, for medical therapy. 3. Acute-on-chronic diastolic heart failure. 4. A 2-D echo cardiomegaly, April 30, 2011, demonstrating normal     ejection fraction of 55-60%. 5. Bradycardia, not on beta-blocker. 6. Acute-on-chronic renal insufficiency with discharge creatinine of     1.59. 7. Atrial fibrillation, recently diagnosed.     a.     Discontinue to widening of the QRS complex, initiated on      amiodarone.     b.     Anticoagulated with Coumadin. 8. Hypertension. 9. Obesity. 10.Hearing loss. 11.Colon and ovarian cancer status post surgery. 12.Thrombocytopenia. 13.Degenerative disease/lumbosacral spondylosis. 14.Hyperlipidemia. 15.Vitamin E and B12 deficiency.  HOSPITAL COURSE:  [name] is an 77 year old female with history of chronic renal insufficiency, hypertension, and obesity, who was recently evaluated by Dr. Gala Romney, for AFib with RPR.  She had an echocardiogram demonstrating normal LV function and negative Myoview in August of this year.  She was started on flecainide.  She was continued on Coumadin as well.  She presented to the hospital with complaints of intermittent chest pain, shortness of breath, presenting her from sleeping.  While being evaluated by Cardiology in the ER, she developed acute hypoxic respiratory failure with decrease in O2, requiring BiPAP, IV Lasix, nebulizer treatments.  Pulmonary care was onboard and felt that her acute-on-chronic respiratory failure was most likely in  the setting of CHF as well as obesity hypoventilation syndrome/sleep apnea. The patient was a DNR/DNI.  She was also known to be markedly hypokalemic which was aggressively repleted.  Her EKG demonstrated a wide complex rhythm much, Dr. Johney Frame, postulated was secondary to flecainide use in the setting of hypokalemia.  Flecainide was subsequently discontinued.  She ruled in for an NSTEMI and T-troponin 1.10.  Dr. Graciela Husbands, felt this was either a type 2 NSTEMI versus valve ischemia given her recent stress test.  She was diuresed with IV Lasix. The patient and her family had discussed with Dr. Graciela Husbands and ultimately agreed upon medical therapy with empiric antiischemic therapy.  She was initiated on amiodarone in place of flecainide for rhythm control.  She was known to have bradycardia this admission, her beta-blocker was discontinued, and was also held at discharge.  She continued to improve from both the respiratory and cardiac standpoint.  Blood cultures were negative.  Pharmacy assisted [place] with dose titration of Coumadin given amiodarone.  I have spoken with them in regards to her dose of discharge, they recommended for her to get 2 mg today while here in the hospital, go home with her home dose of 2.5 mg to take half tablet tomorrow, then have her INR checked on Friday.  Dr. Graciela Husbands has seen and examined the patient today and feels  she is stable for discharge.  DISCHARGE LABS:  WBC 7.8, hemoglobin 12.1, hematocrit 36.6, platelet count 91, INR 1.6.  Sodium 139, potassium 2.8, chloride 99, CO2 of 33, glucose 103, BUN 23, creatinine 1.59, and blood cultures negative.  STUDIES: 1. Chest x-ray on May 17, 2011, showed small right free-flowing     or right pleural fusion.  Possible small free-flowing left pleural     effusion. 2. Initial chest x-ray on May 14, 2011 showed small pleural     effusions right greater than left for 5 days of atelectasis and     mild-to-moderate  cardiomegaly.  DISCHARGE MEDICATIONS: 1. Aspirin 81 mg daily for 6 weeks per Dr. Graciela Husbands. 2. Amiodarone 200 mg.  The patient is to take 2 tablets twice a day     through June 02, 2011.  From September 19 to June 16, 2011,     she is to take 2 tablets once a day and then on October 3 she is to     start taking 1 tablet once a day. 3. Imdur 30 mg daily. 4. Nitro sublingual 0.4 mg p.r.n. chest pain. 5. Lasix 40 mg daily which is new, increased dose. 6. Coumadin 2.5 mg tablet.  The patient received her Coumadin dose for     September 5.  Tomorrow, May 21, 2011, take 1/2 tablet on     Friday, still have INR checked. 7. Aledronate 10 mg daily. 8. Fenofibrate 134 mg daily. 9. Hydrocodone/APAP 5/500 mg p.r.n. severe back pain. 10.Lovastatin 40 mg nightly. 11.Meclizine 25 mg q.4 h. p.r.n. dizziness. 12.Protonix 40 mg daily.  Flecainide was stopped this admission due to wide QRS complex.  DISPOSITION:  [name] will be discharged in stable condition to home. She is to follow a low-sodium and heart-healthy diet.  She will follow up with Tereso Newcomer, PA-C on June 10, 2011 at 10 a.m.Marland Kitchen  She is also to return to her PCP as well with an 1-2 weeks.  She will have her first INR checked May 22, 2011 by home health nurse who also draw BMET and May 27, 2011 with results 842 and primary care doctor,Dr. Graciela Husbands.  DURATION OF DISCHARGE ENCOUNTER:  Greater than 30 minutes including physician and PA time.     Ronie Spies, P.A.C.   ______________________________ Duke Salvia, MD, [facility]    DD/MEDQ  D:  05/20/2011  T:  05/20/2011  Job:  [id number]  cc:   Bevelyn Buckles. Bensimhon, MD Burnell Blanks, MD  Electronically Signed by Ronie Spies  on 05/25/2011 07:38:53 PM Electronically Signed by Sherryl Manges MD [facility] on 06/08/2011 04:01:56 PM

## 2011-05-29 ENCOUNTER — Ambulatory Visit (INDEPENDENT_AMBULATORY_CARE_PROVIDER_SITE_OTHER): Payer: Self-pay | Admitting: Cardiovascular Disease

## 2011-05-29 DIAGNOSIS — R0989 Other specified symptoms and signs involving the circulatory and respiratory systems: Secondary | ICD-10-CM

## 2011-06-02 ENCOUNTER — Institutional Professional Consult (permissible substitution): Payer: Medicare Other | Admitting: Internal Medicine

## 2011-06-05 ENCOUNTER — Ambulatory Visit (INDEPENDENT_AMBULATORY_CARE_PROVIDER_SITE_OTHER): Payer: Self-pay | Admitting: Cardiovascular Disease

## 2011-06-05 DIAGNOSIS — R0989 Other specified symptoms and signs involving the circulatory and respiratory systems: Secondary | ICD-10-CM

## 2011-06-10 ENCOUNTER — Encounter: Payer: Self-pay | Admitting: Physician Assistant

## 2011-06-10 ENCOUNTER — Ambulatory Visit (INDEPENDENT_AMBULATORY_CARE_PROVIDER_SITE_OTHER): Payer: Medicare Other | Admitting: Physician Assistant

## 2011-06-10 DIAGNOSIS — I4892 Unspecified atrial flutter: Secondary | ICD-10-CM

## 2011-06-10 DIAGNOSIS — R42 Dizziness and giddiness: Secondary | ICD-10-CM | POA: Insufficient documentation

## 2011-06-10 DIAGNOSIS — R0609 Other forms of dyspnea: Secondary | ICD-10-CM

## 2011-06-10 DIAGNOSIS — G4733 Obstructive sleep apnea (adult) (pediatric): Secondary | ICD-10-CM

## 2011-06-10 DIAGNOSIS — Z09 Encounter for follow-up examination after completed treatment for conditions other than malignant neoplasm: Secondary | ICD-10-CM

## 2011-06-10 DIAGNOSIS — I5032 Chronic diastolic (congestive) heart failure: Secondary | ICD-10-CM | POA: Insufficient documentation

## 2011-06-10 DIAGNOSIS — I480 Paroxysmal atrial fibrillation: Secondary | ICD-10-CM

## 2011-06-10 DIAGNOSIS — M549 Dorsalgia, unspecified: Secondary | ICD-10-CM | POA: Insufficient documentation

## 2011-06-10 DIAGNOSIS — R0989 Other specified symptoms and signs involving the circulatory and respiratory systems: Secondary | ICD-10-CM

## 2011-06-10 DIAGNOSIS — R300 Dysuria: Secondary | ICD-10-CM

## 2011-06-10 DIAGNOSIS — I214 Non-ST elevation (NSTEMI) myocardial infarction: Secondary | ICD-10-CM | POA: Insufficient documentation

## 2011-06-10 DIAGNOSIS — I509 Heart failure, unspecified: Secondary | ICD-10-CM

## 2011-06-10 LAB — BASIC METABOLIC PANEL
CO2: 26 mEq/L (ref 19–32)
GFR: 24.06 mL/min — ABNORMAL LOW (ref >60.00)
Glucose, Bld: 99 mg/dL (ref 70–99)
Potassium: 4.4 mEq/L (ref 3.5–5.1)
Sodium: 137 mEq/L (ref 135–145)

## 2011-06-10 MED ORDER — ISOSORBIDE MONONITRATE ER 30 MG PO TB24: ORAL_TABLET | ORAL | Status: DC

## 2011-06-10 NOTE — Assessment & Plan Note (Signed)
She has some orthostatic intolerance.  I will give her compression hose to wear.

## 2011-06-10 NOTE — Progress Notes (Signed)
History of Present Illness: Primary Cardiologist:  Dr. Arvilla Meres   [name] is a 77 y.o. female who presents for post hospital follow up.    She has multiple medical problems including obesity, hypertension, chronic kidney disease, severe hearing loss and a history of colon/kidney/ovarian cancer all treated successfully with surgery.  She saw Dr. Gala Romney in 8/12 for new onset atrial fibrillation.  She was noted to have tachybradycardia syndrome.  It was decided to pursue rhythm control rather than rate control.  She was started on flecainide.  Echo 8/12: Moderate LVH, EF 55-60%, mild MR, mild BAE, PASP 60.  Myoview 80/12: Negative for ischemia, normal LV function.  She was also placed on Coumadin.  She was referred to pulmonary for sleep apnea workup.  She was admitted to [facility] 8/30-9/5 with acute respiratory failure.  She presented initially with chest pain and shortness of breath and quickly decompensated in the emergency room and was placed on BiPAP.  Of note, she is DNR/DNI.  Patient felt that her acute on chronic hypoxic respiratory failure was secondary to diastolic CHF in addition to obesity hypoventilation syndrome/obstructive sleep apnea.  She was treated with IV Lasix.  She developed a wide-complex rhythm in the setting of hypokalemia.  Her flecainide was discontinued.  She was placed on amiodarone for rhythm control.  Her troponin did increase to a peak of 1.10.  It was thought that this may be a type II NSTEMI versus true ischemia.  After discussion with the family, medical therapy was pursued.  She had some bradycardia and beta blocker was discontinued.  Her Coumadin was continued.  She was eventually discharged home in stable condition.  Pertinent labs: Hemoglobin 12.1, per the count 91,000, potassium 3.8, creatinine 1.59, ALT 7, TSH 0.853, peak troponin 1.10.  Chest x-ray demonstrated bilateral pleural effusions.  She is here with her daughter.  Her weights have  been stable at home.  She did have a couple days when her weight went too high.  She did not have to take extra Lasix.  Her Coumadin has been managed by home health.  She has noted some dysuria recently.  She denies fevers or chills.  She walks with a walker.  She describes class III dyspnea.  She describes interscapular back pain.  She apparently has a history of compression fractures.  Apparently a recent x-ray was negative for compression fractures.  She seems to get this with exertion.  She seemed to present to the hospital with interscapular back pain as well.  She denies syncope.  She does note some orthostatic intolerance.  She denies palpitations.  The last ECG I can find in E-chart demonstrates atrial fibrillation.  She notes that she went back and forth between atrial fibrillation and normal sinus rhythm during her hospital stay.    Past Medical History  Diagnosis Date  . Thrombocytopenia   . Sleep related leg cramps   . Cough   . Unspecified hearing loss   . HTN (hypertension)   . Lumbar pain   . Osteopenia   . CKD (chronic kidney disease), stage III   . Chronic constipation   . Unspecified urinary incontinence   . Hyponatremia   . Mitral valve disorders   . Disturbance of skin sensation   . Sciatica   . Unspecified cataract   . Degeneration of intervertebral disc, site unspecified   . Lumbosacral spondylosis without myelopathy   . Edema   . HLD (hyperlipidemia)   . Other B-complex  deficiencies   . Unspecified vitamin D deficiency   . Palpitations   . Dizziness and giddiness   . Colon cancer 1961  . Ovarian cancer 1970  . History of kidney cancer 2005  . Atrial fibrillation     amiodarone; coumadin  . NSTEMI (non-ST elevated myocardial infarction)     in setting of hypoxic resp failure 9/12: ? type 2 NSTEMI vs. true ischemia (med therapy chosen)  . Chronic diastolic heart failure   . DNR (do not resuscitate)     Current Outpatient Prescriptions  Medication Sig  Dispense Refill  . acetaminophen (TYLENOL) 500 MG tablet Take 500 mg by mouth every 6 (six) hours as needed.        Marland Kitchen alendronate (FOSAMAX) 10 MG tablet Take 10 mg by mouth daily before breakfast. Take with a full glass of water on an empty stomach.       Marland Kitchen amiodarone (PACERONE) 200 MG tablet Take 200 mg by mouth daily.        Marland Kitchen aspirin 81 MG tablet Take 81 mg by mouth daily.        . fenofibrate micronized (LOFIBRA) 134 MG capsule Take 134 mg by mouth daily before breakfast.        . furosemide (LASIX) 20 MG tablet Take 20 mg by mouth daily.        Marland Kitchen HYDROcodone-acetaminophen (VICODIN) 5-500 MG per tablet Take 1 tablet by mouth every 6 (six) hours as needed.        . lovastatin (MEVACOR) 40 MG tablet Take 40 mg by mouth at bedtime.        . meclizine (ANTIVERT) 25 MG tablet Take 25 mg by mouth 3 (three) times daily as needed.        . nitroGLYCERIN (NITROSTAT) 0.4 MG SL tablet Place 0.4 mg under the tongue every 5 (five) minutes as needed.        . pantoprazole (PROTONIX) 40 MG tablet Take 1 tablet (40 mg total) by mouth daily.  30 tablet  6  . warfarin (COUMADIN) 2.5 MG tablet Take 1 tablet (2.5 mg total) by mouth daily. As directed   45 tablet  3  . isosorbide mononitrate (IMDUR) 30 MG 24 hr tablet TAKE 1 AND 1/2 (HALF) TABLETS DAILY  45 tablet  11    Allergies: No Known Allergies  ROS:  Please see the history of present illness.  All other systems reviewed and negative.   Vital Signs: BP 122/70  Pulse 80  Wt 179 lb (81.194 kg)  PHYSICAL EXAM: Well nourished, well developed, in no acute distress HEENT: normal Neck: no JVD 90 Cardiac:  normal S1, S2; Irregularly irregular; no murmur Lungs:  clear to auscultation bilaterally, no wheezing, rhonchi or rales Abd: soft, nontender Ext: no edema Skin: warm and dry Neuro:  CNs 2-12 intact, no focal abnormalities noted Psych: Normal affect  EKG:  Atrial fibrillation, heart rate 80, interventricular conduction delay, T waves  inversions in one and aVL, poor R-wave progression, no significant change when compared to prior tracing  ASSESSMENT AND PLAN:

## 2011-06-10 NOTE — Assessment & Plan Note (Addendum)
She is in atrial fibrillation.  Her heart rate is controlled.  She remains on Coumadin.  I will pull her most recent INRs (9/7; 1.7; 9/14: 3.1; 9/21: 5.3).  I am uncertain as to whether or not she's really a candidate for DC cardioversion.  I will try to touch base with Dr. Gala Romney to discuss this further.  I would not discontinue her amiodarone at this point as she is rate controlled.  She may be having episodes of atrial fibrillation with mostly normal sinus rhythm.  Therefore, her medications will be unchanged.  She has a history of thrombocytopenia.  I will check a followup CBC today.  Discussed with Dr. Gala Romney.  As above, will continue amiodarone and coumadin for now.  Will see what her rhythm is in follow up.

## 2011-06-10 NOTE — Assessment & Plan Note (Signed)
?   Anginal equivalent.  Try to increase isosorbide.

## 2011-06-10 NOTE — Patient Instructions (Signed)
Your physician recommends that you schedule a follow-up appointment in: 06/30/11  @ 9:30 AM TO SEE SCOTT WEAVER, PA-C.  Your physician has recommended you make the following change in your medication: START IMDUR 30 MG TAKE 1 AND 1/2 (HALF) TABLETS ONCE DAILY.  Your physician recommends that you return for lab work in: TODAY UA, URINE CULTURE, CBC W/DIFF, BMET, BNP HEART FAILURE, BACK PAIN, DYSURIA.  Your physician recommends that you schedule a follow-up appointment in: DR. ALVA TO FOLLOW UP FROM YOUR SLEEP STUDY PER SCOTT WEAVER, PAC.  YOU HAVE BEEN GIVEN A PRESCRIPTION TO GET COMPRESSION STOCKINGS.Marland Kitchen

## 2011-06-10 NOTE — Assessment & Plan Note (Signed)
Arrange follow up with Dr. Vassie Loll.

## 2011-06-10 NOTE — Assessment & Plan Note (Signed)
Check u/a and urine culture.

## 2011-06-10 NOTE — Assessment & Plan Note (Signed)
She appears to be stable from a volume standpoint.  Check a basic metabolic panel and BNP today.  Followup with me or Dr. Gala Romney in 2-3 weeks.

## 2011-06-10 NOTE — Assessment & Plan Note (Signed)
The decision was made to pursue medical therapy.  She is on Coumadin and aspirin.  She did not tolerate beta blockers secondary to bradycardia.  She does have interscapular back pain.  I am uncertain if this is musculoskeletal or anginal equivalent.  I will try to adjust her isosorbide to 45 mg a day to see if this helps.

## 2011-06-12 ENCOUNTER — Ambulatory Visit (INDEPENDENT_AMBULATORY_CARE_PROVIDER_SITE_OTHER): Payer: Medicare Other | Admitting: Internal Medicine

## 2011-06-12 ENCOUNTER — Telehealth: Payer: Self-pay

## 2011-06-12 ENCOUNTER — Telehealth: Payer: Self-pay | Admitting: Physician Assistant

## 2011-06-12 LAB — URINALYSIS, ROUTINE W REFLEX MICROSCOPIC
Bilirubin Urine: NEGATIVE
Nitrite: NEGATIVE
Specific Gravity, Urine: <=1.005 (ref 1.000–1.030)
pH: >=9 (ref 5.0–8.0)

## 2011-06-12 NOTE — Telephone Encounter (Signed)
Pt's dtr told by the lab she is dyhydrated, pls call to advise

## 2011-06-12 NOTE — Telephone Encounter (Signed)
Routing to [employer].

## 2011-06-12 NOTE — Telephone Encounter (Signed)
Pt was seen by Tereso Newcomer on 06/10/11 requested labs to be drawn, saw [place] in the lab unable to get blood for labs ordered was told pt was dehydrated? Nurse states pt does not appear dehydrated, good skin turgor, good po intake, alert and oriented x 3.  Maryelizabeth Kaufmann states she will return first of next week to try to obtain labs.

## 2011-06-12 NOTE — Telephone Encounter (Signed)
Pt's dtr told by the lab she is dyhydrated, pls call to advise   

## 2011-06-15 NOTE — Telephone Encounter (Signed)
Labs get drawn? Tereso Newcomer, PA-C

## 2011-06-15 NOTE — Telephone Encounter (Signed)
Did labs get drawn? Tereso Newcomer, PA-C

## 2011-06-19 ENCOUNTER — Ambulatory Visit (INDEPENDENT_AMBULATORY_CARE_PROVIDER_SITE_OTHER): Payer: Medicare Other | Admitting: Cardiovascular Disease

## 2011-06-19 DIAGNOSIS — R0989 Other specified symptoms and signs involving the circulatory and respiratory systems: Secondary | ICD-10-CM

## 2011-06-19 LAB — POCT INR: INR: 4.5

## 2011-06-19 MED ORDER — WARFARIN SODIUM 1 MG PO TABS: 1.0000 mg | ORAL_TABLET | Freq: Every day | ORAL | Status: DC

## 2011-06-26 ENCOUNTER — Ambulatory Visit (INDEPENDENT_AMBULATORY_CARE_PROVIDER_SITE_OTHER): Payer: Medicare Other | Admitting: Pulmonary Disease

## 2011-06-26 ENCOUNTER — Encounter: Payer: Self-pay | Admitting: Pulmonary Disease

## 2011-06-26 VITALS — BP 122/74 | HR 88 | Temp 98.1°F | Ht 64.0 in | Wt 180.4 lb

## 2011-06-26 DIAGNOSIS — G4733 Obstructive sleep apnea (adult) (pediatric): Secondary | ICD-10-CM

## 2011-06-26 NOTE — Assessment & Plan Note (Signed)
obstructive sleep apnea is mild but given severity of cardiac disease, resaonable to treat. She did not tolerate a fullf ace mask during the hospital admission - we will trial autoCPAP with nasal pillows. If she is unable to tolerate after reasonable attempts, we will use nocturnal oxygen instead even though this will  Be suboptimal. The various therapies for obstructive sleep apnea were discussed Weight loss encouraged, compliance with goal of at least 4-6 hrs every night is the expectation. Advised against medications with sedative side effects

## 2011-06-26 NOTE — Patient Instructions (Signed)
We will set you up with a CPAP machine with a small mask Send in the card before your next appt

## 2011-06-26 NOTE — Progress Notes (Signed)
  Subjective:    Patient ID: [name], female    DOB: 1927-01-24, 77 y.o.   MRN: [id number]  HPI 77 y/o woman referred for evaluation of obstructive sleep apnea  She has HTN, CRI, severe hearing loss and h/o colon/kidney/ovarian CA all treated successfully with surgery. Seen by Dr Gala Romney for [name] now on flecainide & verapamil & coumadin  Daughter reports snoring, witnessed apneas & starts back with little noises  Hydrocodone at bedtime, nocturia +, takes lasix in am, sleeps in a recliner due to a bad back x 3 yrs  ESS 10  [facility]gery [facility] for hearing implants - was told that she was a difficult airway  Bedtime 10-11 pm, minimal latency, frequent arousals, nocturia +, wakes up at 0800, keeps a headache, no dryness. Admitted to [facility] 8/30-05/20/11 with acute hypoxic respiratory failure secondary to diastolic CHF . She  decompensated in the emergency room and was placed on BiPAP. Of note, she is DNR/DNI. She was treated with IV Lasix. She developed a wide-complex rhythm in the setting of hypokalemia. Her flecainide was discontinued. She was placed on amiodarone for rhythm control. INR has been running high  PSG 05/07/11 showed mild obstructive sleep apnea with AHI 10/h, RDI 15/h, predom hypopneas & desatn to 79%. PLM index wsa 41/h but not associated with arousals. She slept in a recliner & sleep efficiency was poor.    Review of Systems Patient denies significant dyspnea,cough, hemoptysis,  chest pain, palpitations, pedal edema, orthopnea, paroxysmal nocturnal dyspnea, lightheadedness, nausea, vomiting, abdominal or  leg pains       Objective:   Physical Exam Gen. Pleasant, obese, in no distress ENT - no lesions, no post nasal drip Neck: No JVD, no thyromegaly, no carotid bruits Lungs: no use of accessory muscles, no dullness to percussion, decreased without rales or rhonchi  Cardiovascular: Rhythm regular, heart sounds  normal, no murmurs or gallops, no peripheral  edema Musculoskeletal: No deformities, no cyanosis or clubbing , no tremors       Assessment & Plan:

## 2011-06-30 ENCOUNTER — Ambulatory Visit (INDEPENDENT_AMBULATORY_CARE_PROVIDER_SITE_OTHER): Payer: Medicare Other | Admitting: *Deleted

## 2011-06-30 ENCOUNTER — Encounter: Payer: Self-pay | Admitting: Physician Assistant

## 2011-06-30 ENCOUNTER — Other Ambulatory Visit: Payer: Self-pay

## 2011-06-30 ENCOUNTER — Ambulatory Visit (INDEPENDENT_AMBULATORY_CARE_PROVIDER_SITE_OTHER): Payer: Medicare Other | Admitting: Physician Assistant

## 2011-06-30 ENCOUNTER — Encounter: Payer: Self-pay | Admitting: *Deleted

## 2011-06-30 VITALS — BP 114/62 | HR 80 | Ht 65.0 in | Wt 176.0 lb

## 2011-06-30 DIAGNOSIS — I4891 Unspecified atrial fibrillation: Secondary | ICD-10-CM

## 2011-06-30 DIAGNOSIS — M549 Dorsalgia, unspecified: Secondary | ICD-10-CM

## 2011-06-30 DIAGNOSIS — G4733 Obstructive sleep apnea (adult) (pediatric): Secondary | ICD-10-CM

## 2011-06-30 DIAGNOSIS — I509 Heart failure, unspecified: Secondary | ICD-10-CM

## 2011-06-30 DIAGNOSIS — I5032 Chronic diastolic (congestive) heart failure: Secondary | ICD-10-CM

## 2011-06-30 DIAGNOSIS — I480 Paroxysmal atrial fibrillation: Secondary | ICD-10-CM

## 2011-06-30 LAB — CBC WITH DIFFERENTIAL/PLATELET
Basophils Relative: 0.3 % (ref 0.0–3.0)
Eosinophils Absolute: 0.1 10*3/uL (ref 0.0–0.7)
Eosinophils Relative: 1.3 % (ref 0.0–5.0)
Hemoglobin: 14.1 g/dL (ref 12.0–15.0)
Lymphocytes Relative: 14.5 % (ref 12.0–46.0)
MCHC: 33.2 g/dL (ref 30.0–36.0)
MCV: 89.3 fl (ref 78.0–100.0)
Neutro Abs: 6 10*3/uL (ref 1.4–7.7)
Neutrophils Relative %: 77.7 % — ABNORMAL HIGH (ref 43.0–77.0)
RBC: 4.74 Mil/uL (ref 3.87–5.11)
WBC: 7.7 10*3/uL (ref 4.5–10.5)

## 2011-06-30 LAB — BASIC METABOLIC PANEL
BUN: 16 mg/dL (ref 6–23)
CO2: 26 mEq/L (ref 19–32)
CO2: 23 mEq/L (ref 19–32)
Calcium: 9.2 mg/dL (ref 8.4–10.5)
Chloride: 101 mEq/L (ref 96–112)
Chloride: 101 mEq/L (ref 96–112)
Creatinine, Ser: 1.5 mg/dL — ABNORMAL HIGH (ref 0.4–1.2)
Glucose, Bld: 113 mg/dL — ABNORMAL HIGH (ref 70–99)
Glucose, Bld: 112 mg/dL — ABNORMAL HIGH (ref 70–99)
Sodium: 136 mEq/L (ref 135–145)

## 2011-06-30 LAB — PROTIME-INR: INR: 4.3 ratio — ABNORMAL HIGH (ref 0.8–1.0)

## 2011-06-30 MED ORDER — POTASSIUM CHLORIDE ER 10 MEQ PO TBCR: 10.0000 meq | EXTENDED_RELEASE_TABLET | ORAL | Status: DC

## 2011-06-30 NOTE — Progress Notes (Signed)
History of Present Illness: Primary Cardiologist:  Dr. Arvilla Meres   [name] is a 77 y.o. female who presents for post hospital follow up.    She has multiple medical problems including obesity, hypertension, chronic kidney disease, severe hearing loss and a history of colon/kidney/ovarian cancer all treated successfully with surgery.  She saw Dr. Gala Romney in 8/12 for new onset atrial fibrillation.  She was noted to have tachybradycardia syndrome.  It was decided to pursue rhythm control rather than rate control.  She was started on flecainide.  Echo 8/12: Moderate LVH, EF 55-60%, mild MR, mild BAE, PASP 60.  Myoview 80/12: Negative for ischemia, normal LV function.  She was also placed on Coumadin.  She was referred to pulmonary for sleep apnea workup.  She was admitted 8/30-9/5 with acute respiratory failure.  Acute on chronic hypoxic respiratory failure was secondary to diastolic CHF in addition to obesity hypoventilation syndrome/obstructive sleep apnea.  She was treated with IV Lasix.  She developed a wide-complex rhythm in the setting of hypokalemia.  Her flecainide was discontinued.  She was placed on amiodarone for rhythm control.  Her troponin did increase to a peak of 1.10.  It was thought that this may be a type II NSTEMI versus true ischemia.  After discussion with the family, medical therapy was pursued.  She had some bradycardia and beta blocker was discontinued.  Her Coumadin was continued.    I saw her in followup in 9/26.  She was still in atrial fibrillation.  After discussion with Dr. Gala Romney, we decided to continue her amiodarone as it was probably controlling her heart rate as well.  Her volume appeared stable.  The followup labs 9/26: Potassium 4.4, creatinine 2.1, BNP 798.  Unfortunately her labs returned 2 weeks after being drawn and her basic metabolic panel is being redrawn today.  She continued to have pain between her shoulder blades.  It was uncertain if this was a  musculoskeletal symptom Or an anginal equivalent.  Her isosorbide was adjusted slightly.  She is being followed by Dr. Vassie Loll for sleep apnea.  She feels "swimmy headed" today.  States she knows her INR is too high.  It was over 4.  She denies syncope.  Sleeps in a recliner due to back pain.  No chest pain.  Able to do ADLs without significant dyspnea.  No edema.  Weights at home stable.  She denies palpitations.  Still has interscapular back pain.  Made better at times with changes in positioning.  Not clearly exertional.    Past Medical History  Diagnosis Date  . Thrombocytopenia   . Sleep related leg cramps   . Cough   . Unspecified hearing loss   . HTN (hypertension)   . Lumbar pain   . Osteopenia   . CKD (chronic kidney disease), stage III   . Chronic constipation   . Unspecified urinary incontinence   . Hyponatremia   . Mitral valve disorders   . Disturbance of skin sensation   . Sciatica   . Unspecified cataract   . Degeneration of intervertebral disc, site unspecified   . Lumbosacral spondylosis without myelopathy   . Edema   . HLD (hyperlipidemia)   . Other B-complex deficiencies   . Unspecified vitamin D deficiency   . Palpitations   . Dizziness and giddiness   . Colon cancer 1961  . Ovarian cancer 1970  . History of kidney cancer 2005  . Atrial fibrillation     amiodarone; coumadin  .  NSTEMI (non-ST elevated myocardial infarction)     in setting of hypoxic resp failure 9/12: ? type 2 NSTEMI vs. true ischemia (med therapy chosen)  . Chronic diastolic heart failure   . DNR (do not resuscitate)     Current Outpatient Prescriptions  Medication Sig Dispense Refill  . acetaminophen (TYLENOL) 500 MG tablet Take 500 mg by mouth every 6 (six) hours as needed.        Marland Kitchen alendronate (FOSAMAX) 10 MG tablet Take 10 mg by mouth daily before breakfast. Take with a full glass of water on an empty stomach.       Marland Kitchen amiodarone (PACERONE) 200 MG tablet Take 200 mg by mouth daily.         Marland Kitchen aspirin 81 MG tablet Take 81 mg by mouth daily.        . fenofibrate micronized (LOFIBRA) 134 MG capsule Take 134 mg by mouth daily before breakfast.        . furosemide (LASIX) 20 MG tablet Take 20 mg by mouth daily.        Marland Kitchen HYDROcodone-acetaminophen (VICODIN) 5-500 MG per tablet Take 1 tablet by mouth every 6 (six) hours as needed.        . isosorbide mononitrate (IMDUR) 30 MG 24 hr tablet TAKE 1 AND 1/2 (HALF) TABLETS DAILY  45 tablet  11  . lovastatin (MEVACOR) 40 MG tablet Take 40 mg by mouth at bedtime.        . meclizine (ANTIVERT) 25 MG tablet Take 25 mg by mouth 3 (three) times daily as needed.        . nitroGLYCERIN (NITROSTAT) 0.4 MG SL tablet Place 0.4 mg under the tongue every 5 (five) minutes as needed.        . pantoprazole (PROTONIX) 40 MG tablet Take 1 tablet (40 mg total) by mouth daily.  30 tablet  6  . warfarin (COUMADIN) 1 MG tablet Take 1 tablet (1 mg total) by mouth daily.  30 tablet  2    Allergies: No Known Allergies  ROS:  Please see the history of present illness.  No further dysuria.  She has followed up with PCP.  All other systems reviewed and negative.   Vital Signs: BP 114/62  Pulse 80  Ht 5\' 5"  (1.651 m)  Wt 176 lb (79.833 kg)  BMI 29.29 kg/m2  PHYSICAL EXAM: Well nourished, well developed, in no acute distress HEENT: normal Neck: no JVD 90 Cardiac:  normal S1, S2; Irregularly irregular; no murmur Lungs:  clear to auscultation bilaterally, no wheezing, rhonchi or rales Abd: soft, nontender Ext: no edema Skin: warm and dry Neuro:  CNs 2-12 intact, no focal abnormalities noted Psych: Normal affect  EKG:  Atrial fibrillation, heart rate 80, left bundle branch block  ASSESSMENT AND PLAN:

## 2011-06-30 NOTE — Assessment & Plan Note (Signed)
Volume stable.  Creatinine up recently and she feels lightheaded.  Decrease Lasix to QOD for now.  Check bmet and repeat in one week.  She knows to call if weights are going up.

## 2011-06-30 NOTE — Assessment & Plan Note (Signed)
Still in AFib.  Discussed with Dr.  Gala Romney.  Will keep her on amiodarone and try to restore NSR with DCCV at least once.  She has had therapeutic and supratherapeutic INRs for the last month.  We discussed the risks and benefits and she is willing to proceed.  She will follow up with Dr. Gala Romney.

## 2011-06-30 NOTE — Patient Instructions (Addendum)
Weigh daily and call if:  Weight up 3 lbs in one day, increased swelling or increased dyspnea.   Your physician recommends that you return for lab work in: TODAY BMET 428.32 HEART FAILURE  Your physician recommends that you return for lab work in: 07/06/11 PRE DCCV LABS BMET, CBC W/DIFF, PT/INR  YOU HAVE AN APPT  FOR COUMADIN CLINIC TO CHECK YOUR INR THE DAY BEFORE YOUR CARDIOVERSION  Your physician has recommended that you have a Cardioversion (DCCV) 07/08/11 WITH DR. Gala Romney @ 11 AM. Electrical Cardioversion uses a jolt of electricity to your heart either through paddles or wired patches attached to your chest. This is a controlled, usually prescheduled, procedure. Defibrillation is done under light anesthesia in the hospital, and you usually go home the day of the procedure. This is done to get your heart back into a normal rhythm. You are not awake for the procedure. Please see the instruction sheet given to you today.  Your physician has recommended you make the following change in your medication: DECREASE LASIX TO EVERY OTHER DAY

## 2011-06-30 NOTE — Assessment & Plan Note (Signed)
Managed by Dr.Alva 

## 2011-06-30 NOTE — Assessment & Plan Note (Signed)
I'm not convinced this is anginal.  No further changes in medications.

## 2011-07-01 ENCOUNTER — Other Ambulatory Visit: Payer: Self-pay

## 2011-07-01 DIAGNOSIS — I5032 Chronic diastolic (congestive) heart failure: Secondary | ICD-10-CM

## 2011-07-02 ENCOUNTER — Telehealth: Payer: Self-pay | Admitting: Internal Medicine

## 2011-07-02 NOTE — Telephone Encounter (Signed)
Spoke w/Hilda she states yest pt's wt was 177 and it was 180 today.  On 10/16 she saw Tereso Newcomer, PA and he decreased her lasix to qod, today was her day to take the lasix and she did take it this am, she has been going to the bathroom all day, no edema or SOB, Earley Abide will call me tomorrow with update on her weight.

## 2011-07-02 NOTE — Telephone Encounter (Signed)
Note sent to Heart Failure clinic.

## 2011-07-02 NOTE — Telephone Encounter (Signed)
Weight up 3# today.  Denies sob but is coughing (productive).  Not eating well today.  Taking Lasix 20mg  qod.

## 2011-07-02 NOTE — Telephone Encounter (Signed)
Pt's daughter called.  She said her mother has gained 3 pounds since yesterday Please call

## 2011-07-03 ENCOUNTER — Telehealth (HOSPITAL_COMMUNITY): Payer: Self-pay | Admitting: *Deleted

## 2011-07-03 NOTE — Telephone Encounter (Signed)
Spoke w/[name] she states pt wt today is 175 lb, which is 5 lbs lighter than yesterday, she did not take lasix today, advised her to continue same dose of lasix 20 mg qod and continue to monitor wt, let us know if up again

## 2011-07-03 NOTE — Telephone Encounter (Signed)
[name] called this am returning your call. She would like you to call her back.

## 2011-07-06 ENCOUNTER — Other Ambulatory Visit (INDEPENDENT_AMBULATORY_CARE_PROVIDER_SITE_OTHER): Payer: Medicare Other | Admitting: *Deleted

## 2011-07-06 DIAGNOSIS — I509 Heart failure, unspecified: Secondary | ICD-10-CM

## 2011-07-06 DIAGNOSIS — I5032 Chronic diastolic (congestive) heart failure: Secondary | ICD-10-CM

## 2011-07-06 LAB — BASIC METABOLIC PANEL
CO2: 27 mEq/L (ref 19–32)
Calcium: 9.1 mg/dL (ref 8.4–10.5)
Chloride: 101 mEq/L (ref 96–112)
Glucose, Bld: 120 mg/dL — ABNORMAL HIGH (ref 70–99)
Sodium: 136 mEq/L (ref 135–145)

## 2011-07-07 ENCOUNTER — Ambulatory Visit (INDEPENDENT_AMBULATORY_CARE_PROVIDER_SITE_OTHER): Payer: Medicare Other | Admitting: *Deleted

## 2011-07-07 ENCOUNTER — Telehealth: Payer: Self-pay

## 2011-07-07 DIAGNOSIS — I480 Paroxysmal atrial fibrillation: Secondary | ICD-10-CM

## 2011-07-07 DIAGNOSIS — Z7901 Long term (current) use of anticoagulants: Secondary | ICD-10-CM

## 2011-07-07 DIAGNOSIS — I4891 Unspecified atrial fibrillation: Secondary | ICD-10-CM

## 2011-07-07 NOTE — Telephone Encounter (Signed)
Pt is pending DCCV tomorrow, with Dr Gala Romney.  INR checked today in clinic 2.0 pt was boosted today, previous INR's have been elevated and running high.  Wanted to make you aware of INR results in case you wanted to recheck tomorrow prior to procedure.  Thanks.

## 2011-07-08 ENCOUNTER — Ambulatory Visit (HOSPITAL_COMMUNITY)
Admission: RE | Admit: 2011-07-08 | Discharge: 2011-07-08 | Disposition: A | Discharge status: Home / Self Care | Payer: Medicare Other | Source: Ambulatory Visit | Attending: Internal Medicine | Admitting: Internal Medicine

## 2011-07-08 DIAGNOSIS — I5030 Unspecified diastolic (congestive) heart failure: Secondary | ICD-10-CM | POA: Insufficient documentation

## 2011-07-08 DIAGNOSIS — Z01810 Encounter for preprocedural cardiovascular examination: Secondary | ICD-10-CM | POA: Insufficient documentation

## 2011-07-08 DIAGNOSIS — N189 Chronic kidney disease, unspecified: Secondary | ICD-10-CM | POA: Insufficient documentation

## 2011-07-08 DIAGNOSIS — I129 Hypertensive chronic kidney disease with stage 1 through stage 4 chronic kidney disease, or unspecified chronic kidney disease: Secondary | ICD-10-CM | POA: Insufficient documentation

## 2011-07-08 DIAGNOSIS — I4891 Unspecified atrial fibrillation: Secondary | ICD-10-CM | POA: Insufficient documentation

## 2011-07-08 HISTORY — PX: CARDIAC CATHETERIZATION: SHX172

## 2011-07-08 NOTE — Telephone Encounter (Signed)
Dr Gala Romney was aware

## 2011-07-09 ENCOUNTER — Encounter: Payer: Medicare Other | Admitting: *Deleted

## 2011-07-15 ENCOUNTER — Ambulatory Visit (INDEPENDENT_AMBULATORY_CARE_PROVIDER_SITE_OTHER): Payer: Medicare Other | Admitting: *Deleted

## 2011-07-15 DIAGNOSIS — I4891 Unspecified atrial fibrillation: Secondary | ICD-10-CM

## 2011-07-15 DIAGNOSIS — I480 Paroxysmal atrial fibrillation: Secondary | ICD-10-CM

## 2011-07-15 DIAGNOSIS — Z7901 Long term (current) use of anticoagulants: Secondary | ICD-10-CM

## 2011-07-15 LAB — POCT INR: INR: 2.4

## 2011-07-16 ENCOUNTER — Ambulatory Visit (INDEPENDENT_AMBULATORY_CARE_PROVIDER_SITE_OTHER): Payer: Medicare Other | Admitting: Physician Assistant

## 2011-07-16 ENCOUNTER — Encounter: Payer: Self-pay | Admitting: Physician Assistant

## 2011-07-16 VITALS — BP 110/78 | HR 83 | Ht 65.0 in | Wt 174.0 lb

## 2011-07-16 DIAGNOSIS — I5032 Chronic diastolic (congestive) heart failure: Secondary | ICD-10-CM

## 2011-07-16 DIAGNOSIS — R0602 Shortness of breath: Secondary | ICD-10-CM

## 2011-07-16 DIAGNOSIS — M549 Dorsalgia, unspecified: Secondary | ICD-10-CM

## 2011-07-16 DIAGNOSIS — I4891 Unspecified atrial fibrillation: Secondary | ICD-10-CM

## 2011-07-16 DIAGNOSIS — I480 Paroxysmal atrial fibrillation: Secondary | ICD-10-CM

## 2011-07-16 DIAGNOSIS — N189 Chronic kidney disease, unspecified: Secondary | ICD-10-CM | POA: Insufficient documentation

## 2011-07-16 DIAGNOSIS — I509 Heart failure, unspecified: Secondary | ICD-10-CM

## 2011-07-16 DIAGNOSIS — G4733 Obstructive sleep apnea (adult) (pediatric): Secondary | ICD-10-CM

## 2011-07-16 LAB — BASIC METABOLIC PANEL
BUN: 29 mg/dL — ABNORMAL HIGH (ref 6–23)
Calcium: 9.6 mg/dL (ref 8.4–10.5)
Creatinine, Ser: 2.2 mg/dL — ABNORMAL HIGH (ref 0.4–1.2)
GFR: 22.92 mL/min — ABNORMAL LOW (ref >60.00)
Glucose, Bld: 107 mg/dL — ABNORMAL HIGH (ref 70–99)
Sodium: 135 mEq/L (ref 135–145)

## 2011-07-16 MED ORDER — FUROSEMIDE 20 MG PO TABS: 20.0000 mg | ORAL_TABLET | Freq: Every day | ORAL | Status: DC

## 2011-07-16 MED ORDER — METOPROLOL SUCCINATE ER 25 MG PO TB24: 25.0000 mg | ORAL_TABLET | Freq: Every day | ORAL | Status: DC

## 2011-07-16 NOTE — Assessment & Plan Note (Signed)
I have been trying to figure out if this is angina vs MSK pain since I met her.  Now, I wonder if her back pain is her symptom of AFib.  Also, increased volume may cause this symptom.  She had a normal myoview prior to being admitted with a/c resp failure and her NSTEMI.  She was felt to be high risk for LHC due to age, comorbidities, CKD.  I discussed this again with the patient and her daughter and she is not eager to pursue LHC.

## 2011-07-16 NOTE — Assessment & Plan Note (Signed)
Follow renal fxn and K+ closely with adjustments in diuretics.

## 2011-07-16 NOTE — Patient Instructions (Addendum)
Your physician recommends that you schedule a follow-up appointment in: 2 WEEKS WITH DR. Ladona Ridgel AS A NEW PT PER DR. Ladona Ridgel  Your physician recommends that you return for lab work in: TODAY BMET, BNP 428.33 HEART FAILURE, 427.31 AFIB  Your physician recommends that you return for lab work in: 07/20/11 REPEAT BMET 428.33, 427.31  Your physician has recommended you make the following change in your medication: INCREASE LASIX 20 MG DAILY; START METOPROLOL SUCCINATE 25 MG 1 TABLET DAILY.

## 2011-07-16 NOTE — Assessment & Plan Note (Signed)
She has rales in the bases on exam and she has been more short of breath.  I will have her increase her Lasix from 20 mg QOD to 20 mg daily.  Check a bmet and bnp today and repeat a bmet early next week to follow up on renal fxn and K+.

## 2011-07-16 NOTE — Assessment & Plan Note (Signed)
She is back in Afib and her rate is controlled.  Interestingly enough, she notes resolution of her back pain when she underwent DCCV.  I question if her back pain is her symptom of Afib.  She also appears to be somewhat volume overloaded today.  Returning to AFib may be driving a/c diastolic CHF.  I reviewed her case with Dr. Ladona Ridgel (DOD).  At this point, we will continue her current dose of amiodarone to continue to achieve rate control.  Question if she has uncontrolled rates at times.  We decided to also put her on low dose Toprol XL 25 mg QD. Hopefully she can tolerate this.  Will bring her back in follow up with Dr. Ladona Ridgel in the next 2-3 weeks.  He can discuss with her then whether or not to re-load her with amiodarone and pursue repeat DCCV at that time vs continued rate control therapy.  Continue coumadin.

## 2011-07-16 NOTE — Progress Notes (Signed)
History of Present Illness: Primary Cardiologist:  Dr. Arvilla Meres   [name] is a 77 y.o. female who presents for follow up on atrial fibrillation.    She has multiple medical problems including obesity, HTN, CKD, and a history of colon/kidney/ovarian cancer all treated successfully with surgery.  She saw Dr. Gala Romney in 8/12 for new onset atrial fibrillation.  She was noted to have tachybradycardia syndrome on an event monitor.  She was placed on flecainide for rhythm control and coumadin.  Echo 8/12: Moderate LVH, EF 55-60%, mild MR, mild BAE, PASP 60.  Myoview 80/12: Negative for ischemia, normal LV function.    She was admitted 8/30-9/5 with a/c hypoxic respiratory failure secondary to diastolic CHF in addition to OHS/OSA.  Wide-complex rhythm in the setting of hypokalemia developed and her flecainide was discontinued.  She was placed on amiodarone.  She had positive enzymes - probably type II NSTEMI versus true ischemia.  After discussion with the family, medical therapy was pursued.  She had some bradycardia and beta blocker was discontinued.    We have been controlling her rate with amiodarone, but decided to pursue DCCV (after discussion with Dr. Gala Romney) to see if we could restore NSR.  This was done 10/24 and restored NSR after 2 shocks.  However, she was seen in coumadin clinic yesterday and ECG demonstrated recurrent AFib.  She was also noting that she did not feel well.  So, she was added on for me to see.  She does note that her interscapular back pain resolved when she underwent DCCV.  It returned a few days ago.  She has had some chest pain off and on.  She is not that active and really does not ambulate that much.  She does minimal ADLs at home.  She can get DOE with this at times.  Recently she has noted more SOB.  She sleeps in a recliner due to back problems.  No recent PND.  She denies syncope.    Past Medical History  Diagnosis Date  . Thrombocytopenia   . Sleep  related leg cramps   . Cough   . Unspecified hearing loss   . HTN (hypertension)   . Lumbar pain   . Osteopenia   . CKD (chronic kidney disease), stage III   . Chronic constipation   . Unspecified urinary incontinence   . Hyponatremia   . Mitral valve disorders   . Disturbance of skin sensation   . Sciatica   . Unspecified cataract   . Degeneration of intervertebral disc, site unspecified   . Lumbosacral spondylosis without myelopathy   . Edema   . HLD (hyperlipidemia)   . Other B-complex deficiencies   . Unspecified vitamin D deficiency   . Palpitations   . Dizziness and giddiness   . Colon cancer 1961  . Ovarian cancer 1970  . History of kidney cancer 2005  . Atrial fibrillation     amiodarone; coumadin  . NSTEMI (non-ST elevated myocardial infarction)     in setting of hypoxic resp failure 9/12: ? type 2 NSTEMI vs. true ischemia (med therapy chosen)  . Chronic diastolic heart failure   . DNR (do not resuscitate)     Current Outpatient Prescriptions  Medication Sig Dispense Refill  . acetaminophen (TYLENOL) 500 MG tablet Take 500 mg by mouth every 6 (six) hours as needed.        Marland Kitchen alendronate (FOSAMAX) 10 MG tablet Take 10 mg by mouth daily before breakfast. Take with a  full glass of water on an empty stomach.       Marland Kitchen amiodarone (PACERONE) 200 MG tablet Take 200 mg by mouth daily.        Marland Kitchen aspirin 81 MG tablet Take 81 mg by mouth daily.        . fenofibrate micronized (LOFIBRA) 134 MG capsule Take 134 mg by mouth daily before breakfast.        . furosemide (LASIX) 20 MG tablet Take 1 tablet (20 mg total) by mouth daily.  30 tablet  11  . HYDROcodone-acetaminophen (VICODIN) 5-500 MG per tablet Take 1 tablet by mouth every 6 (six) hours as needed.        . isosorbide mononitrate (IMDUR) 30 MG 24 hr tablet TAKE 1 AND 1/2 (HALF) TABLETS DAILY  45 tablet  11  . lovastatin (MEVACOR) 40 MG tablet Take 40 mg by mouth at bedtime.        . meclizine (ANTIVERT) 25 MG tablet Take 25  mg by mouth 3 (three) times daily as needed.        . nitroGLYCERIN (NITROSTAT) 0.4 MG SL tablet Place 0.4 mg under the tongue every 5 (five) minutes as needed.        . pantoprazole (PROTONIX) 40 MG tablet Take 1 tablet (40 mg total) by mouth daily.  30 tablet  6  . potassium chloride (K-DUR) 10 MEQ tablet Take 1 tablet (10 mEq total) by mouth every other day. Take two tablets ( ) today (10/16) and tomorrow (10/17), then one tablet every other day on the day you take your Lasix  30 tablet  3  . warfarin (COUMADIN) 1 MG tablet Take 1 tablet (1 mg total) by mouth daily.  30 tablet  2  . DISCONTD: furosemide (LASIX) 20 MG tablet Take 1 tablet (20 mg total) by mouth every other day.      . metoprolol succinate (TOPROL XL) 25 MG 24 hr tablet Take 1 tablet (25 mg total) by mouth daily.  30 tablet  11    Allergies: No Known Allergies   History  Substance Use Topics  . Smoking status: Never Smoker   . Smokeless tobacco: Former Neurosurgeon    Types: Snuff    Quit date: 09/23/2005  . Alcohol Use: No     ROS:  Please see the history of present illness.  She has a cough that is intermittent and notable for clear to yellow sputum.  No hemoptysis.  All other systems reviewed and negative.   Vital Signs: BP 110/78  Pulse 83  Ht 5\' 5"  (1.651 m)  Wt 174 lb (78.926 kg)  BMI 28.96 kg/m2  PHYSICAL EXAM: Well nourished, well developed, in no acute distress HEENT: normal Neck: no appreciable JVD 90 Cardiac:  normal S1, S2; Irregularly irregular; no murmur Lungs:  Bibasilar crackles, no wheezing, rhonchi Abd: soft, nontender Ext: no edema Skin: warm and dry Neuro:  CNs 2-12 intact, no focal abnormalities noted Psych: Normal affect  EKG:  Afib, HR 83, IVCD,   ASSESSMENT AND PLAN:

## 2011-07-17 NOTE — Cardiovascular Report (Signed)
  [name], [name] NO.:  [id number]  MEDICAL RECORD NO.:  [id number]  LOCATION:  [facility]                         FACILITY:  [facility]  PHYSICIAN:  Bevelyn Buckles. Jujhar Everett, MDDATE OF BIRTH:  09/27/1926  DATE OF PROCEDURE:  07/08/2011 DATE OF DISCHARGE:  07/08/2011                           CARDIAC CATHETERIZATION   INDICATION:  [name] is an 77 year old woman with a history of morbid obesity, diastolic heart failure, chronic kidney disease, and atrial fibrillation.  I saw for the first time earlier in the year when she presented with atrial fibrillation with tachy-brady syndrome.  We have attempted to put her on flecainide, but she did not tolerate this well. She was then started on amiodarone which has given her nice rate control and helped tremendously with her symptoms.  She was seen by Tereso Newcomer recently and she was in AFib with controlled rate.  Given the fact that she was already on amiodarone we decided to proceed with a direct current cardioversion to restore the normal rhythm and see if she would hold with amiodarone.  DESCRIPTION OF THE PROCEDURE:  The risks and indication of procedure were that were described. Consent was signed AND placed on the chart. An appropriate time-out was taken. She then underwent sedation by Dr. Michelle Piper, anesthesia with IV propofol. The pads were placed in the anterior and posterior position.  She received a 200 joule biphasic synchronized shock.  She had an initial conversion to sinus rhythm then converted right back to AFib.  We then shocked her again and this time she has been maintaining sinus rhythm for at least 5 minutes postprocedure.  She will continue with amiodarone, we will follow her back up in clinic.  Of note, all INRs have been therapeutic for several weeks prior to cardioversion.     Bevelyn Buckles. Madisun Hargrove, MD     DRB/MEDQ  D:  07/08/2011  T:  07/08/2011  Job:  [id number]  Electronically Signed by Arvilla Meres MD on 07/17/2011 02:08:33 PM

## 2011-07-20 ENCOUNTER — Other Ambulatory Visit (INDEPENDENT_AMBULATORY_CARE_PROVIDER_SITE_OTHER): Payer: Medicare Other | Admitting: *Deleted

## 2011-07-20 DIAGNOSIS — I4891 Unspecified atrial fibrillation: Secondary | ICD-10-CM

## 2011-07-20 DIAGNOSIS — I5032 Chronic diastolic (congestive) heart failure: Secondary | ICD-10-CM

## 2011-07-20 DIAGNOSIS — I509 Heart failure, unspecified: Secondary | ICD-10-CM

## 2011-07-20 LAB — BASIC METABOLIC PANEL
BUN: 29 mg/dL — ABNORMAL HIGH (ref 6–23)
Creatinine, Ser: 2.1 mg/dL — ABNORMAL HIGH (ref 0.4–1.2)
GFR: 24.46 mL/min — ABNORMAL LOW (ref >60.00)

## 2011-07-21 ENCOUNTER — Telehealth: Payer: Self-pay | Admitting: *Deleted

## 2011-07-21 DIAGNOSIS — N189 Chronic kidney disease, unspecified: Secondary | ICD-10-CM

## 2011-07-21 NOTE — Telephone Encounter (Signed)
pt aware of lab results and repeat bmet 11/14 same day as CVRR appt. Danielle Rankin

## 2011-07-29 ENCOUNTER — Ambulatory Visit (INDEPENDENT_AMBULATORY_CARE_PROVIDER_SITE_OTHER): Payer: Medicare Other | Admitting: *Deleted

## 2011-07-29 DIAGNOSIS — Z7901 Long term (current) use of anticoagulants: Secondary | ICD-10-CM

## 2011-07-29 DIAGNOSIS — I480 Paroxysmal atrial fibrillation: Secondary | ICD-10-CM

## 2011-07-29 DIAGNOSIS — I4891 Unspecified atrial fibrillation: Secondary | ICD-10-CM

## 2011-07-29 LAB — POCT INR: INR: 3.2

## 2011-08-03 ENCOUNTER — Telehealth: Payer: Self-pay | Admitting: Pulmonary Disease

## 2011-08-03 ENCOUNTER — Other Ambulatory Visit: Payer: Self-pay | Admitting: Physician Assistant

## 2011-08-03 DIAGNOSIS — G4733 Obstructive sleep apnea (adult) (pediatric): Secondary | ICD-10-CM

## 2011-08-03 NOTE — Telephone Encounter (Signed)
lmomtcb  

## 2011-08-04 NOTE — Telephone Encounter (Signed)
I spoke with pt daughter and she states they won't take the machine unless they have an order to d/c the cpap. Please advise, thanks

## 2011-08-04 NOTE — Telephone Encounter (Signed)
No order required - if pt has returned CPAP

## 2011-08-04 NOTE — Telephone Encounter (Signed)
[id number] x 1 

## 2011-08-04 NOTE — Telephone Encounter (Signed)
Spoke with [name] and advised that order sent to [facility] to have [facility] pick up CPAP. Order sent to [facility].

## 2011-08-04 NOTE — Telephone Encounter (Signed)
Pt's daughter says the pt refuses to wear the cpap and says she feels like she is suffocating. She has tried wearing nasal pillows during the day some and says that she feels the same way. Daughter says she will turn in the machine and has cancelled her follow-up on 08/17/11 with RA. Will forward msg to RA. Pls advise on sending order to D/C cpap.

## 2011-08-04 NOTE — Telephone Encounter (Signed)
OK to send dc order 

## 2011-08-05 ENCOUNTER — Encounter: Payer: Self-pay | Admitting: Internal Medicine

## 2011-08-05 ENCOUNTER — Ambulatory Visit (INDEPENDENT_AMBULATORY_CARE_PROVIDER_SITE_OTHER): Payer: Medicare Other | Admitting: Internal Medicine

## 2011-08-05 DIAGNOSIS — I4891 Unspecified atrial fibrillation: Secondary | ICD-10-CM

## 2011-08-05 DIAGNOSIS — I480 Paroxysmal atrial fibrillation: Secondary | ICD-10-CM

## 2011-08-05 DIAGNOSIS — I509 Heart failure, unspecified: Secondary | ICD-10-CM

## 2011-08-05 DIAGNOSIS — I5032 Chronic diastolic (congestive) heart failure: Secondary | ICD-10-CM

## 2011-08-05 MED ORDER — AMIODARONE HCL 200 MG PO TABS: ORAL_TABLET | ORAL | Status: DC

## 2011-08-05 NOTE — Patient Instructions (Signed)
Your physician recommends that you schedule a follow-up appointment in: 4-6 weeks with Dr Ladona Ridgel  Your physician has recommended you make the following change in your medication:  1) increase your Amiodarone to 200mg  1 1/2 tablets twice daily

## 2011-08-05 NOTE — Assessment & Plan Note (Signed)
Her symptoms are class III in atrial fibrillation. It appears that they are much improved in sinus rhythm. She will continue her current medications, and maintain a low-sodium diet.

## 2011-08-05 NOTE — Progress Notes (Signed)
HPI [name] he is referred today for evaluation of atrial fibrillation. She is an 77 year old woman with a history of palpitations and documented medically refractory atrial fibrillation. She underwent cardioversion and stayed in rhythm for about one week. She was on 200 mg of amiodarone daily prior to this. At this point she feels poorly. Her heart rates have been elevated. The patient has class III dyspnea. This is mostly multifactorial. She denies chest pain or syncope. No Known Allergies   Current Outpatient Prescriptions  Medication Sig Dispense Refill  . acetaminophen (TYLENOL) 500 MG tablet Take 500 mg by mouth 2 (two) times daily as needed.       Marland Kitchen alendronate (FOSAMAX) 10 MG tablet Take 10 mg by mouth daily before breakfast. Take with a full glass of water on an empty stomach.       Marland Kitchen amiodarone (PACERONE) 200 MG tablet Take 1 1/2 tablets twice daily  90 tablet  6  . aspirin 81 MG tablet Take 81 mg by mouth daily.        . fenofibrate micronized (LOFIBRA) 134 MG capsule Take 134 mg by mouth daily before breakfast.        . furosemide (LASIX) 20 MG tablet Take 1 tablet (20 mg total) by mouth daily.  30 tablet  11  . HYDROcodone-acetaminophen (VICODIN) 5-500 MG per tablet Take 1 tablet by mouth every 6 (six) hours as needed.        . isosorbide mononitrate (IMDUR) 30 MG 24 hr tablet TAKE 1 AND 1/2 (HALF) TABLETS DAILY  45 tablet  11  . K-DUR 20 MEQ tablet TAKE 1 TABLET ONCE DAILY.  30 each  6  . lovastatin (MEVACOR) 40 MG tablet Take 40 mg by mouth at bedtime.        . meclizine (ANTIVERT) 25 MG tablet Take 25 mg by mouth 3 (three) times daily as needed.        . metoprolol succinate (TOPROL XL) 25 MG 24 hr tablet Take 1 tablet (25 mg total) by mouth daily.  30 tablet  11  . nitroGLYCERIN (NITROSTAT) 0.4 MG SL tablet Place 0.4 mg under the tongue every 5 (five) minutes as needed.        . pantoprazole (PROTONIX) 40 MG tablet Take 1 tablet (40 mg total) by mouth daily.  30 tablet  6  .  warfarin (COUMADIN) 1 MG tablet Take 1 tablet (1 mg total) by mouth daily.  30 tablet  2  . DISCONTD: amiodarone (PACERONE) 200 MG tablet Take 200 mg by mouth daily.           Past Medical History  Diagnosis Date  . Thrombocytopenia   . Sleep related leg cramps   . Cough   . Unspecified hearing loss   . HTN (hypertension)   . Lumbar pain   . Osteopenia   . CKD (chronic kidney disease), stage III   . Chronic constipation   . Unspecified urinary incontinence   . Hyponatremia   . Mitral valve disorders   . Disturbance of skin sensation   . Sciatica   . Unspecified cataract   . Degeneration of intervertebral disc, site unspecified   . Lumbosacral spondylosis without myelopathy   . Edema   . HLD (hyperlipidemia)   . Other B-complex deficiencies   . Unspecified vitamin D deficiency   . Palpitations   . Dizziness and giddiness   . Colon cancer 1961  . Ovarian cancer 1970  . History of kidney cancer  2005  . Atrial fibrillation     amiodarone; coumadin  . NSTEMI (non-ST elevated myocardial infarction)     in setting of hypoxic resp failure 9/12: ? type 2 NSTEMI vs. true ischemia (med therapy chosen)  . Chronic diastolic heart failure   . DNR (do not resuscitate)   . Hearing loss   . HTN (hypertension)   . Morbid obesity   . Chronic renal insufficiency, stage III (moderate)   . Thrombocytopenia   . Vitamin E deficiency   . Vitamin B12 deficiency     ROS:   All systems reviewed and negative except as noted in the HPI.   Past Surgical History  Procedure Date  . Appendectomy 1945  . Ankle surgery     Pin placed and later removed  . 2-d echocardiogram 04/30/2011  . Negative myoview stress test 05/07/11    for ischemia or infarction. EF of 62%     Family History  Problem Relation Age of Onset  . Heart attack Son      History   Social History  . Marital Status: Widowed    Spouse Name: N/A    Number of Children: N/A  . Years of Education: N/A   Occupational  History  . Not on file.   Social History Main Topics  . Smoking status: Never Smoker   . Smokeless tobacco: Former Neurosurgeon    Types: Snuff    Quit date: 09/23/2005  . Alcohol Use: No  . Drug Use: No  . Sexually Active: Not on file   Other Topics Concern  . Not on file   Social History Narrative  . No narrative on file     BP 142/62  Pulse 47  Ht 5\' 5"  (1.651 m)  Wt 67.042 kg (147 lb 12.8 oz)  BMI 24.60 kg/m2  Physical Exam:  Well appearing elderly woman, NAD HEENT: Unremarkable Neck:  No JVD, no thyromegally Lymphatics:  No adenopathy Back:  No CVA tenderness Lungs:  Clear with no wheezes, rales, or rhonchi. HEART:  IRegular rate rhythm, no murmurs, no rubs, no clicks Abd:  soft, positive bowel sounds, no organomegally, no rebound, no guarding Ext:  2 plus pulses, no edema, no cyanosis, no clubbing Skin:  No rashes no nodules Neuro:  CN II through XII intact, motor grossly intact  ECG - atrial fibrillation with a controlled ventricular response.  Assess/Plan:

## 2011-08-05 NOTE — Assessment & Plan Note (Signed)
Today we discussed the treatment options for the patient's persistent atrial fibrillation. I recommended that we uptitrate her amiodarone to 300 mg twice a day. After approximately one month, we will plan to proceed with repeat cardioversion. Hopefully by increasing her dose of amiodarone, she will maintain sinus rhythm following cardioversion.

## 2011-08-10 ENCOUNTER — Inpatient Hospital Stay (HOSPITAL_COMMUNITY): Admission: RE | Admit: 2011-08-10 | Payer: Medicare Other | Source: Ambulatory Visit

## 2011-08-12 ENCOUNTER — Telehealth (HOSPITAL_COMMUNITY): Payer: Self-pay | Admitting: *Deleted

## 2011-08-12 NOTE — Telephone Encounter (Signed)
Pt's daughter called this am she states that since Dr Ladona Ridgel increased Amiodarone to 300 bid she has felt worse.  She states pt is weaker and is unable to sleep at night.  She reports that today she has vomiting and diarrhea.  Pt also c/o of a cough, with [place] colored phlegm.  She does weight daily and her wt is stable and has not increased.  Advised she could have a stomach virus today with vomiting and diarrhea.  Daughter feels that [place] Ponder is too much, will send to [facility] and Dr Ladona Ridgel for review.  Pt did miss her appt with [place] on Mon b/c she thought [place] had cancelled it, will call her back to reschedule

## 2011-08-13 NOTE — Telephone Encounter (Signed)
Discussed with Dr Ladona Ridgel called  patient's daughter and we are going to try and decrease his Amiodarone to 200mg  twice daily.  The daughter did say that she is some better and it could be a virus but will trt decreasing medication also to see if this helps

## 2011-08-17 ENCOUNTER — Ambulatory Visit: Payer: Medicare Other | Admitting: Pulmonary Disease

## 2011-08-19 ENCOUNTER — Ambulatory Visit (INDEPENDENT_AMBULATORY_CARE_PROVIDER_SITE_OTHER): Payer: Medicare Other | Admitting: *Deleted

## 2011-08-19 ENCOUNTER — Other Ambulatory Visit: Payer: Medicare Other | Admitting: *Deleted

## 2011-08-19 DIAGNOSIS — I480 Paroxysmal atrial fibrillation: Secondary | ICD-10-CM

## 2011-08-19 DIAGNOSIS — Z7901 Long term (current) use of anticoagulants: Secondary | ICD-10-CM

## 2011-08-19 DIAGNOSIS — I4891 Unspecified atrial fibrillation: Secondary | ICD-10-CM

## 2011-08-19 LAB — POCT INR: INR: 3.2

## 2011-08-27 ENCOUNTER — Telehealth: Payer: Self-pay | Admitting: Internal Medicine

## 2011-08-27 NOTE — Telephone Encounter (Signed)
Called and spoke with pt.  Pt requested i call her daughter regarding message.  ATC Hilda. NA .  LMOM for Hilda to call back.  I scheduled pt an appt with RA for 08/31/11 at 4pm, just need to confirm with daughter that this appt date and time will work for them.

## 2011-08-28 NOTE — Telephone Encounter (Signed)
Called spoke with [facility], informed her of pending appt with RA.  Earley Abide okay with this date and time.  Nothing further needed.

## 2011-08-31 ENCOUNTER — Other Ambulatory Visit: Payer: Self-pay | Admitting: *Deleted

## 2011-08-31 ENCOUNTER — Encounter: Payer: Self-pay | Admitting: Pulmonary Disease

## 2011-08-31 ENCOUNTER — Ambulatory Visit (INDEPENDENT_AMBULATORY_CARE_PROVIDER_SITE_OTHER): Payer: Medicare Other | Admitting: Pulmonary Disease

## 2011-08-31 DIAGNOSIS — G4733 Obstructive sleep apnea (adult) (pediatric): Secondary | ICD-10-CM

## 2011-08-31 DIAGNOSIS — R05 Cough: Secondary | ICD-10-CM

## 2011-08-31 MED ORDER — WARFARIN SODIUM 1 MG PO TABS: 1.0000 mg | ORAL_TABLET | Freq: Every day | ORAL | Status: DC

## 2011-08-31 NOTE — Patient Instructions (Signed)
Obtain CXR report from [facility] Trial of zyrtec -D (decongestant) for sinuses Stay on protonix Stop taking robitussin - Take DELSYM 2 tsp thrice daily as needed for cough

## 2011-08-31 NOTE — Progress Notes (Signed)
  Subjective:    Patient ID: [name], female    DOB: 1927/03/15, 76 y.o.   MRN: [id number]  HPI  PCP - [place]  77 y/o woman presents  for evaluation of cough x 3 months  She has HTN, CRI, severe hearing loss and h/o colon/kidney/ovarian CA all treated successfully with surgery. Seen by Dr Gala Romney for [name] now on flecainide & verapamil & coumadin  Daughter reports snoring, witnessed apneas & starts back with little noises  Hydrocodone at bedtime, nocturia +, takes lasix in am, sleeps in a recliner due to a bad back x 3 yrs  ESS 10  Surgery at [facility] for hearing implants - was told that she was a difficult airway  Admitted to [facility] 8/30-05/20/11 with acute hypoxic respiratory failure secondary to diastolic CHF . She decompensated in the emergency room and was placed on BiPAP. Of note, she is DNR/DNI. She was treated with IV Lasix. She developed a wide-complex rhythm in the setting of hypokalemia. Her flecainide was discontinued. She was placed on amiodarone for rhythm control. PSG 05/07/11 showed mild obstructive sleep apnea with AHI 10/h, RDI 15/h, predom hypopneas & desatn to 79%. PLM index wsa 41/h but not associated with arousals. She slept in a recliner & sleep efficiency was poor. >> did NOT tolerate trial of CPAP  08/31/2011 Cough x 3 mnths , worse in sleep, feels drip at the back of her throat, sleeps in recliner productive cough with clear foamy mucus No reflux breakthrough on protonix, no wheezing Had CXR done at [place] by dr Nathanial Rancher >> result not available No ACE on med review, no childhood h/oa sthma   Review of Systems Pt denies any significant  nasal congestion or excess secretions, fever, chills, sweats, unintended wt loss, pleuritic or exertional cp, orthopnea pnd or leg swelling.  Pt also denies any obvious fluctuation in symptoms with weather or environmental change or other alleviating or aggravating factors.    Pt denies any increase in rescue therapy over  baseline, denies waking up needing it or having early am exacerbations or coughing/wheezing/ or dyspnea      Objective:   Physical Exam Gen. Pleasant, well-nourished, in no distress ENT - no lesions, no post nasal drip Neck: No JVD, no thyromegaly, no carotid bruits Lungs: no use of accessory muscles, no dullness to percussion, LLL rales , no  rhonchi  Cardiovascular: Rhythm regular, heart sounds  normal, no murmurs or gallops, no peripheral edema Musculoskeletal: No deformities, no cyanosis or clubbing         Assessment & Plan:

## 2011-08-31 NOTE — Assessment & Plan Note (Signed)
Did not tolerate CPAP Will not pursue therapy here.

## 2011-08-31 NOTE — Assessment & Plan Note (Signed)
Target usual culprits - sinusitis, GERD,  bibasal crackles raises the question of ILD, also note bibasal opacities on prior CXRs Obtain CXR report from [facility] Trial of zyrtec -D (decongestant) for sinuses Stay on protonix Stop taking robitussin - Take DELSYM 2 tsp thrice daily as needed for cough

## 2011-09-03 ENCOUNTER — Institutional Professional Consult (permissible substitution): Payer: Medicare Other | Admitting: Internal Medicine

## 2011-09-11 ENCOUNTER — Ambulatory Visit (INDEPENDENT_AMBULATORY_CARE_PROVIDER_SITE_OTHER): Payer: Medicare Other | Admitting: *Deleted

## 2011-09-11 DIAGNOSIS — I4891 Unspecified atrial fibrillation: Secondary | ICD-10-CM

## 2011-09-11 DIAGNOSIS — I480 Paroxysmal atrial fibrillation: Secondary | ICD-10-CM

## 2011-09-11 DIAGNOSIS — Z7901 Long term (current) use of anticoagulants: Secondary | ICD-10-CM

## 2011-09-11 LAB — POCT INR: INR: 2.5

## 2011-09-22 ENCOUNTER — Ambulatory Visit (INDEPENDENT_AMBULATORY_CARE_PROVIDER_SITE_OTHER): Payer: Medicare Other | Admitting: Internal Medicine

## 2011-09-22 ENCOUNTER — Encounter: Payer: Self-pay | Admitting: Internal Medicine

## 2011-09-22 VITALS — BP 128/60 | HR 49 | Ht 65.0 in | Wt 173.0 lb

## 2011-09-22 DIAGNOSIS — I509 Heart failure, unspecified: Secondary | ICD-10-CM

## 2011-09-22 DIAGNOSIS — I4891 Unspecified atrial fibrillation: Secondary | ICD-10-CM

## 2011-09-22 DIAGNOSIS — I480 Paroxysmal atrial fibrillation: Secondary | ICD-10-CM

## 2011-09-22 DIAGNOSIS — I5032 Chronic diastolic (congestive) heart failure: Secondary | ICD-10-CM

## 2011-09-22 NOTE — Patient Instructions (Signed)
Your physician recommends that you schedule a follow-up appointment in: 3 months with Dr Taylor  

## 2011-09-22 NOTE — Progress Notes (Signed)
HPI [name] returns today for followup. She is a very pleasant 78 year old woman with a history of atrial fibrillation and chronic dyspnea which is thought to be multifactorial. When I saw the patient 2 months ago, she was more short of breath and atrial fibrillation. At that time we increased her amiodarone. She was unable to tolerate 600 mg daily for long but has tolerated 400 mg a day reasonably well. She has spontaneously returned to sinus rhythm. Her dyspnea is improved though still present. She has had a chronic cough though it appears that she may have had this before starting amiodarone. No Known Allergies   Current Outpatient Prescriptions  Medication Sig Dispense Refill  . acetaminophen (TYLENOL) 500 MG tablet Take 500 mg by mouth 2 (two) times daily as needed.       Marland Kitchen alendronate (FOSAMAX) 10 MG tablet Take 10 mg by mouth daily before breakfast. Take with a full glass of water on an empty stomach.       Marland Kitchen amiodarone (PACERONE) 200 MG tablet Take 200 mg by mouth 2 (two) times daily.        Marland Kitchen aspirin 81 MG tablet Take 81 mg by mouth daily.        . cetirizine (ZYRTEC) 10 MG tablet Take 10 mg by mouth daily.        Marland Kitchen dextromethorphan (DELSYM) 30 MG/5ML liquid Take 60 mg by mouth as needed.        . fenofibrate micronized (LOFIBRA) 134 MG capsule Take 134 mg by mouth daily before breakfast.        . furosemide (LASIX) 20 MG tablet Take 1 tablet (20 mg total) by mouth daily.  30 tablet  11  . HYDROcodone-acetaminophen (VICODIN) 5-500 MG per tablet Take 1 tablet by mouth every 6 (six) hours as needed.        . isosorbide mononitrate (IMDUR) 30 MG 24 hr tablet TAKE 1 AND 1/2 (HALF) TABLETS DAILY  45 tablet  11  . K-DUR 20 MEQ tablet TAKE 1 TABLET ONCE DAILY.  30 each  6  . lovastatin (MEVACOR) 40 MG tablet Take 40 mg by mouth at bedtime.        . meclizine (ANTIVERT) 25 MG tablet Take 25 mg by mouth 3 (three) times daily as needed.        . metoprolol succinate (TOPROL XL) 25 MG 24 hr  tablet Take 1 tablet (25 mg total) by mouth daily.  30 tablet  11  . nitroGLYCERIN (NITROSTAT) 0.4 MG SL tablet Place 0.4 mg under the tongue every 5 (five) minutes as needed.        . pantoprazole (PROTONIX) 40 MG tablet Take 1 tablet (40 mg total) by mouth daily.  30 tablet  6  . warfarin (COUMADIN) 1 MG tablet Take 1 tablet (1 mg total) by mouth daily.  30 tablet  2     Past Medical History  Diagnosis Date  . Thrombocytopenia   . Sleep related leg cramps   . Cough   . Unspecified hearing loss   . HTN (hypertension)   . Lumbar pain   . Osteopenia   . CKD (chronic kidney disease), stage III   . Chronic constipation   . Unspecified urinary incontinence   . Hyponatremia   . Mitral valve disorders   . Disturbance of skin sensation   . Sciatica   . Unspecified cataract   . Degeneration of intervertebral disc, site unspecified   . Lumbosacral spondylosis without myelopathy   .  Edema   . HLD (hyperlipidemia)   . Other B-complex deficiencies   . Unspecified vitamin D deficiency   . Palpitations   . Dizziness and giddiness   . Colon cancer 1961  . Ovarian cancer 1970  . History of kidney cancer 2005  . Atrial fibrillation     amiodarone; coumadin  . NSTEMI (non-ST elevated myocardial infarction)     in setting of hypoxic resp failure 9/12: ? type 2 NSTEMI vs. true ischemia (med therapy chosen)  . Chronic diastolic heart failure   . DNR (do not resuscitate)   . Hearing loss   . HTN (hypertension)   . Morbid obesity   . Chronic renal insufficiency, stage III (moderate)   . Thrombocytopenia   . Vitamin E deficiency   . Vitamin B12 deficiency     ROS:   All systems reviewed and negative except as noted in the HPI.   Past Surgical History  Procedure Date  . Appendectomy 1945  . Ankle surgery     Pin placed and later removed  . 2-d echocardiogram 04/30/2011  . Negative myoview stress test 05/07/11    for ischemia or infarction. EF of 62%     Family History  Problem  Relation Age of Onset  . Heart attack Son      History   Social History  . Marital Status: Widowed    Spouse Name: N/A    Number of Children: N/A  . Years of Education: N/A   Occupational History  . Not on file.   Social History Main Topics  . Smoking status: Never Smoker   . Smokeless tobacco: Former Neurosurgeon    Types: Snuff    Quit date: 09/23/2005  . Alcohol Use: No  . Drug Use: No  . Sexually Active: Not on file   Other Topics Concern  . Not on file   Social History Narrative  . No narrative on file     BP 128/60  Pulse 49  Ht 5\' 5"  (1.651 m)  Wt 78.472 kg (173 lb)  BMI 28.79 kg/m2  Physical Exam:  Elderly appearing NAD HEENT: Unremarkable Neck:  No JVD, no thyromegally Lymphatics:  No adenopathy Back:  No CVA tenderness Lungs:  Clear with minimal basilar rales. HEART:  Regular rate rhythm, no murmurs, no rubs, no clicks Abd:  soft, positive bowel sounds, no organomegally, no rebound, no guarding Ext:  2 plus pulses, no edema, no cyanosis, no clubbing Skin:  No rashes no nodules Neuro:  CN II through XII intact, motor grossly intact  EKG Nsr  Assess/Plan:

## 2011-09-22 NOTE — Assessment & Plan Note (Signed)
She appears to be maintaining NSR on 400 mg of amiodarone daily. I plan to continue this dose for an additional 3 months before reducing her downward to 200-300 mg a day. She will continue coumadin.

## 2011-09-22 NOTE — Assessment & Plan Note (Signed)
Her symptoms in NSR appear to be class 2 (versus class 3 with atrial fib). She will continue her current meds. I will continue warfarin.

## 2011-10-06 ENCOUNTER — Encounter: Payer: Self-pay | Admitting: Adult Health

## 2011-10-06 ENCOUNTER — Ambulatory Visit (INDEPENDENT_AMBULATORY_CARE_PROVIDER_SITE_OTHER): Payer: Medicare Other | Admitting: Adult Health

## 2011-10-06 DIAGNOSIS — R05 Cough: Secondary | ICD-10-CM

## 2011-10-06 MED ORDER — HYDROCODONE-HOMATROPINE 5-1.5 MG/5ML PO SYRP: ORAL_SOLUTION | ORAL | Status: DC

## 2011-10-06 MED ORDER — PREDNISONE 10 MG PO TABS: ORAL_TABLET | ORAL | Status: DC

## 2011-10-06 NOTE — Assessment & Plan Note (Signed)
Chronic cough ? Etiology -suspect is multifactoral aggravated by GERD and AR  However she is on Amiodarone -recent CXR with no changes -low suspicion.  Also if not improving , consider CT sinus as well  Will tx with aggressive cough, rhinitis and gerd regimen. With close follow up  Advised pt and sister if sedating effects too much with meds to call and will decrease/stop   Plan;  Hold fosamax.  Begin Delsym 2 tsp Twice daily   Zyrtec 10mg  in am.  Begin Clorphenaramine 4mg  2  At bedtime  May use Hydromet 1/2-2 tsp every 6 hr as needed. -this will make you sleepy.  Add Pepcid 20mg  At bedtime   GOAL IS NOT TO COUGH OR CLEAR THROAT Prednisone taper over next week.  Continue on Protonix daily before meal .  follow up in 2 weeks with Dr. Vassie Loll or Berdia Lachman NP  Please contact office for sooner follow up if symptoms do not improve or worsen or seek emergency care

## 2011-10-06 NOTE — Patient Instructions (Signed)
Hold fosamax.  Begin Delsym 2 tsp Twice daily   Zyrtec 10mg  in am.  Begin Clorphenaramine 4mg  2  At bedtime  May use Hydromet 1/2-2 tsp every 6 hr as needed. -this will make you sleepy.  Add Pepcid 20mg  At bedtime   GOAL IS NOT TO COUGH OR CLEAR THROAT Prednisone taper over next week.  Continue on Protonix daily before meal .  follow up in 2 weeks with Dr. Vassie Loll or [place] NP  Please contact office for sooner follow up if symptoms do not improve or worsen or seek emergency care

## 2011-10-06 NOTE — Progress Notes (Signed)
Subjective:    Patient ID: [name], female    DOB: [birth year], 78 y.o.   MRN: [id number]  HPI   PCP - [name]  78 y/o woman presents  for evaluation of cough x 3 months  She has HTN, CRI, severe hearing loss and h/o colon/kidney/ovarian CA all treated successfully with surgery. Seen by Dr [name] for [name] now on flecainide & verapamil & coumadin  Daughter reports snoring, witnessed apneas & starts back with little noises  Hydrocodone at bedtime, nocturia +, takes lasix in am, sleeps in a recliner due to a bad back x 3 yrs  ESS 10  Surgery at [name] for hearing implants - was told that she was a difficult airway  Admitted to [name] 8/30-05/20/11 with acute hypoxic respiratory failure secondary to diastolic CHF . She decompensated in the emergency room and was placed on BiPAP. Of note, she is DNR/DNI. She was treated with IV Lasix. She developed a wide-complex rhythm in the setting of hypokalemia. Her flecainide was discontinued. She was placed on amiodarone for rhythm control. PSG 05/07/11 showed mild obstructive sleep apnea with AHI 10/h, RDI 15/h, predom hypopneas & desatn to 79%. PLM index wsa 41/h but not associated with arousals. She slept in a recliner & sleep efficiency was poor. >> did NOT tolerate trial of CPAP  08/20/10 Cough x 3 mnths , worse in sleep, feels drip at the back of her throat, sleeps in recliner productive cough with clear foamy mucus No reflux breakthrough on protonix, no wheezing Had CXR done at [place] by dr [name] >> result not available No ACE on med review, no childhood h/oa sthma >>rx zyrtec and protonix   10/06/2011 Acute OV  Complains of persistent cough for 4 months . Seen initially by PCP . Given abx  Without any help. Has  Severe coughing paroxsyms . Coughs so hard at times. Worse for last 2 weeks.  Started on Delsym and zyrtec last ov  Not helping.  CXR done 08/27/11 report reviewed -NAD.  Never smoked  Has a lot of drainage in throat  and esp at night. No overt reflux although feels something in throat at night a lot.  No fever or hemoptysis .  Cough started after starting on Amiodarone . CXR report with no acute process on 08/27/11 .   Review of Systems  Constitutional:   No  weight loss, night sweats,  Fevers, chills, fatigue, or  lassitude.  HEENT:   No headaches,  Difficulty swallowing,  Tooth/dental problems, or  Sore throat,                No sneezing, itching, ear ache,  +nasal congestion, post nasal drip,   CV:  No chest pain,  Orthopnea, PND, swelling in lower extremities, anasarca, dizziness, palpitations, syncope.   GI  No heartburn, indigestion, abdominal pain, nausea, vomiting, diarrhea, change in bowel habits, loss of appetite, bloody stools.   Resp:  ,  No coughing up of blood.   No chest wall deformity  Skin: no rash or lesions.  GU: no dysuria, change in color of urine, no urgency or frequency.  No flank pain, no hematuria   MS:  No joint pain or swelling.  No decreased range of motion.  No back pain.  Psych:  No change in mood or affect. No depression or anxiety.  No memory loss.         Objective:   Physical Exam  Gen. Pleasant, overweight, chronically ill appearing , in  wheelchair  in no distress ENT - no lesions, no post nasal drip Neck: No JVD, no thyromegaly, no carotid bruits Lungs: no use of accessory muscles, no dullness to percussion , coarse BS w/ no wheezing  Cardiovascular: Rhythm regular, heart sounds  normal, no murmurs or gallops, no peripheral edema Musculoskeletal: No deformities, no cyanosis or clubbing         Assessment & Plan:

## 2011-10-09 ENCOUNTER — Encounter: Payer: Medicare Other | Admitting: *Deleted

## 2011-10-13 ENCOUNTER — Ambulatory Visit (INDEPENDENT_AMBULATORY_CARE_PROVIDER_SITE_OTHER): Payer: Medicare Other | Admitting: *Deleted

## 2011-10-13 DIAGNOSIS — I480 Paroxysmal atrial fibrillation: Secondary | ICD-10-CM

## 2011-10-13 DIAGNOSIS — Z7901 Long term (current) use of anticoagulants: Secondary | ICD-10-CM

## 2011-10-13 DIAGNOSIS — I4891 Unspecified atrial fibrillation: Secondary | ICD-10-CM

## 2011-10-16 ENCOUNTER — Telehealth: Payer: Self-pay | Admitting: Internal Medicine

## 2011-10-16 NOTE — Telephone Encounter (Signed)
Spoke with pt's caregiver.  Blood in urine started after she saw [place] on Tuesday.  Saw Dr. Nathanial Rancher on Wednesday.  Was placed on Keflex.  Cultures still pending.  Bleeding has improved and she is only having some slight issues in the morning.  Instructed pt to call Dr. Miguel Rota office for further management.  May need to change abx.  Her INR was high on Tuesday.  Dose adjusted.  She will call us back on Monday if still having signs of bleeding and we will check INR

## 2011-10-16 NOTE — Telephone Encounter (Signed)
Pt's dtr calling re pt having blood in urine, went to pcp had ua/culture done, waiting on results, still passing blood and told to call our office

## 2011-10-20 ENCOUNTER — Encounter: Payer: Self-pay | Admitting: Pulmonary Disease

## 2011-10-20 ENCOUNTER — Ambulatory Visit (INDEPENDENT_AMBULATORY_CARE_PROVIDER_SITE_OTHER): Payer: Medicare Other | Admitting: Pulmonary Disease

## 2011-10-20 VITALS — BP 124/74 | HR 47 | Temp 97.9°F | Ht 64.0 in | Wt 171.0 lb

## 2011-10-20 DIAGNOSIS — R05 Cough: Secondary | ICD-10-CM

## 2011-10-20 NOTE — Patient Instructions (Signed)
Stay on protonix Use DELSYm as needeed OK to restart fosamax in march

## 2011-10-20 NOTE — Progress Notes (Signed)
  Subjective:    Patient ID: [name], female    DOB: 1926/11/27, 78 y.o.   MRN: [id number]  HPI PCP - Hamrick  Cards - taylor  78 y/o woman presents for evaluation of cough x 3 months  She has HTN, CRI, severe hearing loss and h/o colon/kidney/ovarian CA all treated successfully with surgery. Seen by Dr Gala Romney for [name] now on flecainide & verapamil & coumadin  Daughter reports snoring, witnessed apneas & starts back with little noises  Hydrocodone at bedtime, nocturia +, takes lasix in am, sleeps in a recliner due to a bad back x 3 yrs  ESS 10  Surgery at [facility] for hearing implants - was told that she was a difficult airway  Admitted to [facility] 8/30-05/20/11 with acute hypoxic respiratory failure secondary to diastolic CHF . She decompensated in the emergency room and was placed on BiPAP. Of note, she is DNR/DNI. She was treated with IV Lasix. She developed a wide-complex rhythm in the setting of hypokalemia. Her flecainide was discontinued. She was placed on amiodarone for rhythm control. PSG 05/07/11 showed mild obstructive sleep apnea with AHI 10/h, RDI 15/h, predom hypopneas & desatn to 79%. PLM index wsa 41/h but not associated with arousals. She slept in a recliner & sleep efficiency was poor. >> did NOT tolerate trial of CPAP   10/20/2011  10/06/2011 Acute OV  Complains of persistent cough for 4 months . Started on Delsym and zyrtec last ov Not helping.  CXR done 08/27/11 report reviewed -NAD.  Has a lot of drainage in throat and esp at night. No overt reflux although feels something in throat at night a lot. >> Hold fosamax.  Begin Delsym 2 tsp Twice daily  Zyrtec 10mg  in am.  Begin Clorphenaramine 4mg  2 At bedtime  May use Hydromet 1/2-2 tsp every 6 hr as needed. -this will make you sleepy.  Add Pepcid 20mg  At bedtime  GOAL IS NOT TO COUGH OR CLEAR THROAT  Prednisone taper  Continue on Protonix daily before meal   Much better ,Kidney infection,losing wt - reason not  clear, INR 3.9    Review of Systems Patient denies significant dyspnea,cough, hemoptysis,  chest pain, palpitations, pedal edema, orthopnea, paroxysmal nocturnal dyspnea, lightheadedness, nausea, vomiting, abdominal or  leg pains      Objective:   Physical Exam  Gen. Pleasant, well-nourished, in no distress ENT - no lesions, no post nasal drip Neck: No JVD, no thyromegaly, no carotid bruits Lungs: no use of accessory muscles, no dullness to percussion, clear without rales or rhonchi  Cardiovascular: Rhythm regular, heart sounds  normal, no murmurs or gallops, no peripheral edema Musculoskeletal: No deformities, no cyanosis or clubbing        Assessment & Plan:

## 2011-10-20 NOTE — Assessment & Plan Note (Signed)
Combination of GERD & upper airway cough Bibasal crackles - but no ILD evident on CXR If symptoms recur, will proceed with HRCT chest for bronchiectasis - but she is better since last visit , hence hold off

## 2011-11-03 ENCOUNTER — Ambulatory Visit (INDEPENDENT_AMBULATORY_CARE_PROVIDER_SITE_OTHER): Payer: Medicare Other

## 2011-11-03 ENCOUNTER — Ambulatory Visit: Payer: Medicare Other | Admitting: Pulmonary Disease

## 2011-11-03 DIAGNOSIS — Z7901 Long term (current) use of anticoagulants: Secondary | ICD-10-CM

## 2011-11-03 DIAGNOSIS — I480 Paroxysmal atrial fibrillation: Secondary | ICD-10-CM

## 2011-11-03 DIAGNOSIS — I4891 Unspecified atrial fibrillation: Secondary | ICD-10-CM

## 2011-11-03 NOTE — Progress Notes (Signed)
I can not sign 

## 2011-11-04 ENCOUNTER — Ambulatory Visit (INDEPENDENT_AMBULATORY_CARE_PROVIDER_SITE_OTHER): Payer: Medicare Other | Admitting: Physician Assistant

## 2011-11-04 ENCOUNTER — Encounter: Payer: Self-pay | Admitting: Physician Assistant

## 2011-11-04 VITALS — BP 140/62 | HR 45 | Ht 65.0 in | Wt 170.4 lb

## 2011-11-04 DIAGNOSIS — N189 Chronic kidney disease, unspecified: Secondary | ICD-10-CM

## 2011-11-04 DIAGNOSIS — R5383 Other fatigue: Secondary | ICD-10-CM

## 2011-11-04 DIAGNOSIS — I480 Paroxysmal atrial fibrillation: Secondary | ICD-10-CM

## 2011-11-04 DIAGNOSIS — R5381 Other malaise: Secondary | ICD-10-CM

## 2011-11-04 DIAGNOSIS — I4891 Unspecified atrial fibrillation: Secondary | ICD-10-CM

## 2011-11-04 DIAGNOSIS — I509 Heart failure, unspecified: Secondary | ICD-10-CM

## 2011-11-04 DIAGNOSIS — I5032 Chronic diastolic (congestive) heart failure: Secondary | ICD-10-CM

## 2011-11-04 DIAGNOSIS — I214 Non-ST elevation (NSTEMI) myocardial infarction: Secondary | ICD-10-CM

## 2011-11-04 NOTE — Assessment & Plan Note (Signed)
Check CBC with recent hematuria.  Decrease Toprol as noted.  Also, check TSH.

## 2011-11-04 NOTE — Patient Instructions (Signed)
Your physician has recommended you make the following change in your medication: DECREASE TOPROL XL 25 MG TO 12.5 MG DAILY  Your physician recommends that you return for lab work in: TODAY BMET 428.32, CBC W/DIFF 427.31, 780.79, TSH 427.31, LFT 427.31

## 2011-11-04 NOTE — Assessment & Plan Note (Addendum)
Maintaining NSR.  She is bradycardic.  She is fatigued.  I am not certain if bradycardia is the cause.  Dr. Lewayne Bunting wanted to keep her on her current dose of amiodarone for 3 more mos.  I will have her decrease her Toprol to 12.5 mg QD to see if this helps.  Check TSH and LFTs today with amiodarone therapy.

## 2011-11-04 NOTE — Progress Notes (Addendum)
[address] [place], [place]  [address] Phone: [phone] Fax:  [phone]  Date:  11/04/2011   Name:  [name]       DOB:  10-31-1926 MRN:  [id number]  PCP:  Dr. Nathanial Rancher  Primary Electrophysiologist:  Dr. Lewayne Bunting    History of Present Illness: [name] is a 78 y.o. female who returns for follow up.  She has multiple medical problems including obesity, HTN, CKD, and a history of colon/kidney/ovarian cancer all treated successfully with surgery. She saw Dr. Gala Romney in 8/12 for new onset atrial fibrillation. She was noted to have tachybradycardia syndrome on an event monitor. She was placed on flecainide for rhythm control and coumadin. Echo 8/12: Moderate LVH, EF 55-60%, mild MR, mild BAE, PASP 60. Myoview 8/12: Negative for ischemia, normal LV function.   Amitted 8/30-9/5 with a/c hypoxic respiratory failure secondary to diastolic CHF in addition to OHS/OSA. She had a wide-complex rhythm in the setting of hypokalemia and flecainide was discontinued. She was placed on amiodarone. She had positive enzymes - probably type II NSTEMI versus true ischemia. After discussion with the family, medical therapy was pursued. She had some bradycardia and beta blocker was discontinued.   Underwent DCCV 10/24 and restored NSR but then had recurrent AFib several days later.   I had her see Dr. Lewayne Bunting for further management and he increased her Amiodarone with plans for a second attempt at DCCV.  When seen last by Dr. Lewayne Bunting on 09/22/11, she had spontaneously converted to NSR.  Her symptoms were improved.  He planned to keep her on 400 mg of amiodarone daily for an additional 3 months prior to reducing her dose further.   She has frequent bladder infections.  She sees urology in the next week (Dr. Harvin Hazel) in [place].  She apparently had significant hematuria.  Her daughter is with her today.  She made this appointment to have her assessed before she saw  urology in case surgery is recommended.  The patient denies chest pain.  No significant change in her dyspnea.  No syncope.  No orthopnea, PND or edema.  She notes fatigue.  She thinks it may be worse.    Past Medical History  Diagnosis Date  . Thrombocytopenia   . Sleep related leg cramps   . Unspecified hearing loss   . HTN (hypertension)   . Lumbar pain   . Osteopenia   . CKD (chronic kidney disease), stage III   . Chronic constipation   . Unspecified urinary incontinence   . Hyponatremia   . Mitral valve disorders   . Disturbance of skin sensation   . Sciatica   . Unspecified cataract   . Lumbosacral spondylosis without myelopathy   . Edema   . HLD (hyperlipidemia)   . Other B-complex deficiencies   . Unspecified vitamin D deficiency   . Colon cancer 1961  . Ovarian cancer 1970  . History of kidney cancer 2005  . Atrial fibrillation     amiodarone; coumadin  . NSTEMI (non-ST elevated myocardial infarction)     in setting of hypoxic resp failure 9/12: ? type 2 NSTEMI vs. true ischemia (med therapy chosen)  . Chronic diastolic heart failure     Echo 8/12: Moderate LVH, EF 55-60%, mild MR, mild BAE, PASP 60. Myoview 8/12: Negative for ischemia, normal LV function.   . DNR (do not resuscitate)   . Morbid obesity     Current Outpatient Prescriptions  Medication Sig  Dispense Refill  . acetaminophen (TYLENOL) 500 MG tablet Take 500 mg by mouth 2 (two) times daily as needed.       Marland Kitchen alendronate (FOSAMAX) 10 MG tablet Take 10 mg by mouth daily before breakfast. Take with a full glass of water on an empty stomach.       Marland Kitchen amiodarone (PACERONE) 200 MG tablet Take 200 mg by mouth 2 (two) times daily.        Marland Kitchen aspirin 81 MG tablet Take 81 mg by mouth daily.        . cetirizine (ZYRTEC) 10 MG tablet Take 10 mg by mouth daily.        Marland Kitchen dextromethorphan (DELSYM) 30 MG/5ML liquid Take 60 mg by mouth as needed.        . fenofibrate micronized (LOFIBRA) 134 MG capsule Take 134 mg by  mouth daily before breakfast.        . furosemide (LASIX) 20 MG tablet Take 1 tablet (20 mg total) by mouth daily.  30 tablet  11  . HYDROcodone-acetaminophen (VICODIN) 5-500 MG per tablet Take 1 tablet by mouth every 6 (six) hours as needed.        Marland Kitchen HYDROcodone-homatropine (HYDROMET) 5-1.5 MG/5ML syrup 1/2 - 2 tsp every 6 hours as needed - will make you sleepy  240 mL  0  . isosorbide mononitrate (IMDUR) 30 MG 24 hr tablet TAKE 1 AND 1/2 (HALF) TABLETS DAILY  45 tablet  11  . K-DUR 20 MEQ tablet TAKE 1 TABLET ONCE DAILY.  30 each  6  . lovastatin (MEVACOR) 40 MG tablet Take 40 mg by mouth at bedtime.        . meclizine (ANTIVERT) 25 MG tablet Take 25 mg by mouth 3 (three) times daily as needed.        . metoprolol succinate (TOPROL XL) 25 MG 24 hr tablet Take 1 tablet (25 mg total) by mouth daily.  30 tablet  11  . nitroGLYCERIN (NITROSTAT) 0.4 MG SL tablet Place 0.4 mg under the tongue every 5 (five) minutes as needed.        . pantoprazole (PROTONIX) 40 MG tablet Take 1 tablet (40 mg total) by mouth daily.  30 tablet  6  . warfarin (COUMADIN) 1 MG tablet Take 1 tablet (1 mg total) by mouth daily.  30 tablet  2    Allergies: No Known Allergies  History  Substance Use Topics  . Smoking status: Never Smoker   . Smokeless tobacco: Current User    Types: Snuff  . Alcohol Use: No     ROS:  Please see the history of present illness.   All other systems reviewed and negative.   PHYSICAL EXAM: VS:  BP 140/62  Pulse 45  Ht 5\' 5"  (1.651 m)  Wt 170 lb 6.4 oz (77.293 kg)  BMI 28.36 kg/m2 Well nourished, well developed, in no acute distress HEENT: normal Neck: no JVD at 90 degrees Cardiac:  normal S1, S2; RRR; no murmur Lungs:  clear to auscultation bilaterally, no wheezing, rhonchi or rales Abd: soft, nontender, no hepatomegaly Ext: no edema Skin: warm and dry Neuro:  CNs 2-12 intact, no focal abnormalities noted  EKG:  Sinus brady, HR 45, LAD, IVCD, no change from prior  tracing  ASSESSMENT AND PLAN:

## 2011-11-04 NOTE — Assessment & Plan Note (Signed)
She sees urology soon for recurrent bladder infections and hematuria.  Her daughter set up this appointment to see what "shape she is in" prior to her evaluation (in case she needs surgery).  From a cardiac perspective, she would be moderate risk (at best) for cardiovascular complications with any procedure.  She has a h/o NSTEMI in the setting of tachyarrhythmia.  This may have been a Type 2 NSTEMI due to demand ischemia vs true ischemic heart disease.  Conservative management was pursued by the family's and patient's request.  Her stress test prior to that event was low risk.  I will try to keep her on beta blocker therapy in the event she does go to a surgical procedure.  She has been advised to tell her urologist to contact us in the event surgery is recommended.

## 2011-11-04 NOTE — Assessment & Plan Note (Signed)
Check BMET today 

## 2011-11-04 NOTE — Assessment & Plan Note (Signed)
Volume stable.  As noted above, she would be moderate risk at best for CV complications with any procedure.  Volume status would have to be monitored closely to avoid volume overload, in the event she has a procedure.  As noted, she should have her urologist contact us if she needs any surgery performed.

## 2011-11-05 ENCOUNTER — Other Ambulatory Visit: Payer: Self-pay | Admitting: *Deleted

## 2011-11-05 DIAGNOSIS — I4891 Unspecified atrial fibrillation: Secondary | ICD-10-CM

## 2011-11-05 LAB — HEPATIC FUNCTION PANEL
Albumin: 3.4 g/dL — ABNORMAL LOW (ref 3.5–5.2)
Alkaline Phosphatase: 38 U/L — ABNORMAL LOW (ref 39–117)

## 2011-11-05 LAB — BASIC METABOLIC PANEL
BUN: 15 mg/dL (ref 6–23)
Calcium: 8.8 mg/dL (ref 8.4–10.5)
Creatinine, Ser: 1.8 mg/dL — ABNORMAL HIGH (ref 0.4–1.2)
GFR: 28.56 mL/min — ABNORMAL LOW (ref >60.00)
Glucose, Bld: 87 mg/dL (ref 70–99)
Potassium: 4.4 mEq/L (ref 3.5–5.1)

## 2011-11-05 LAB — CBC WITH DIFFERENTIAL/PLATELET
Basophils Absolute: 0 10*3/uL (ref 0.0–0.1)
HCT: 39.8 % (ref 36.0–46.0)
Lymphs Abs: 1.9 10*3/uL (ref 0.7–4.0)
Monocytes Absolute: 0.5 10*3/uL (ref 0.1–1.0)
Monocytes Relative: 9.8 % (ref 3.0–12.0)
Neutrophils Relative %: 49.4 % (ref 43.0–77.0)
Platelets: 174 10*3/uL (ref 150.0–400.0)
RDW: 14.9 % — ABNORMAL HIGH (ref 11.5–14.6)

## 2011-11-05 LAB — TSH: TSH: 0.77 u[IU]/mL (ref 0.35–5.50)

## 2011-11-19 ENCOUNTER — Emergency Department (HOSPITAL_COMMUNITY): Payer: Medicare Other

## 2011-11-19 ENCOUNTER — Inpatient Hospital Stay (HOSPITAL_COMMUNITY)
Admission: EM | Admit: 2011-11-19 | Discharge: 2011-11-21 | DRG: 292 | Disposition: A | Discharge status: Home / Self Care | Payer: Medicare Other | Source: Ambulatory Visit | Attending: Internal Medicine | Admitting: Internal Medicine

## 2011-11-19 ENCOUNTER — Other Ambulatory Visit: Payer: Self-pay

## 2011-11-19 ENCOUNTER — Encounter (HOSPITAL_COMMUNITY): Payer: Self-pay | Admitting: Emergency Medicine

## 2011-11-19 DIAGNOSIS — R5383 Other fatigue: Secondary | ICD-10-CM

## 2011-11-19 DIAGNOSIS — Z85528 Personal history of other malignant neoplasm of kidney: Secondary | ICD-10-CM

## 2011-11-19 DIAGNOSIS — R0902 Hypoxemia: Secondary | ICD-10-CM

## 2011-11-19 DIAGNOSIS — I252 Old myocardial infarction: Secondary | ICD-10-CM

## 2011-11-19 DIAGNOSIS — R05 Cough: Secondary | ICD-10-CM

## 2011-11-19 DIAGNOSIS — I5033 Acute on chronic diastolic (congestive) heart failure: Principal | ICD-10-CM

## 2011-11-19 DIAGNOSIS — N183 Chronic kidney disease, stage 3 unspecified: Secondary | ICD-10-CM | POA: Diagnosis present

## 2011-11-19 DIAGNOSIS — Z9981 Dependence on supplemental oxygen: Secondary | ICD-10-CM

## 2011-11-19 DIAGNOSIS — R319 Hematuria, unspecified: Secondary | ICD-10-CM | POA: Diagnosis present

## 2011-11-19 DIAGNOSIS — I480 Paroxysmal atrial fibrillation: Secondary | ICD-10-CM

## 2011-11-19 DIAGNOSIS — R42 Dizziness and giddiness: Secondary | ICD-10-CM

## 2011-11-19 DIAGNOSIS — I129 Hypertensive chronic kidney disease with stage 1 through stage 4 chronic kidney disease, or unspecified chronic kidney disease: Secondary | ICD-10-CM | POA: Diagnosis present

## 2011-11-19 DIAGNOSIS — Z7901 Long term (current) use of anticoagulants: Secondary | ICD-10-CM

## 2011-11-19 DIAGNOSIS — Z66 Do not resuscitate: Secondary | ICD-10-CM | POA: Diagnosis present

## 2011-11-19 DIAGNOSIS — G4733 Obstructive sleep apnea (adult) (pediatric): Secondary | ICD-10-CM

## 2011-11-19 DIAGNOSIS — I5032 Chronic diastolic (congestive) heart failure: Secondary | ICD-10-CM

## 2011-11-19 DIAGNOSIS — M549 Dorsalgia, unspecified: Secondary | ICD-10-CM

## 2011-11-19 DIAGNOSIS — I4891 Unspecified atrial fibrillation: Secondary | ICD-10-CM

## 2011-11-19 DIAGNOSIS — I498 Other specified cardiac arrhythmias: Secondary | ICD-10-CM | POA: Diagnosis present

## 2011-11-19 DIAGNOSIS — J90 Pleural effusion, not elsewhere classified: Secondary | ICD-10-CM | POA: Diagnosis present

## 2011-11-19 DIAGNOSIS — Z85038 Personal history of other malignant neoplasm of large intestine: Secondary | ICD-10-CM

## 2011-11-19 DIAGNOSIS — F17200 Nicotine dependence, unspecified, uncomplicated: Secondary | ICD-10-CM | POA: Diagnosis present

## 2011-11-19 DIAGNOSIS — R110 Nausea: Secondary | ICD-10-CM

## 2011-11-19 DIAGNOSIS — G44209 Tension-type headache, unspecified, not intractable: Secondary | ICD-10-CM

## 2011-11-19 DIAGNOSIS — I214 Non-ST elevation (NSTEMI) myocardial infarction: Secondary | ICD-10-CM

## 2011-11-19 DIAGNOSIS — J329 Chronic sinusitis, unspecified: Secondary | ICD-10-CM | POA: Diagnosis present

## 2011-11-19 DIAGNOSIS — Z8543 Personal history of malignant neoplasm of ovary: Secondary | ICD-10-CM

## 2011-11-19 DIAGNOSIS — R059 Cough, unspecified: Secondary | ICD-10-CM

## 2011-11-19 DIAGNOSIS — R3129 Other microscopic hematuria: Secondary | ICD-10-CM

## 2011-11-19 DIAGNOSIS — N189 Chronic kidney disease, unspecified: Secondary | ICD-10-CM

## 2011-11-19 DIAGNOSIS — R079 Chest pain, unspecified: Secondary | ICD-10-CM | POA: Diagnosis present

## 2011-11-19 DIAGNOSIS — R300 Dysuria: Secondary | ICD-10-CM

## 2011-11-19 DIAGNOSIS — I509 Heart failure, unspecified: Secondary | ICD-10-CM | POA: Diagnosis present

## 2011-11-19 LAB — CBC
MCH: 29.3 pg (ref 26.0–34.0)
Platelets: 84 10*3/uL — ABNORMAL LOW (ref 150–400)
RBC: 4.68 MIL/uL (ref 3.87–5.11)
WBC: 5.4 10*3/uL (ref 4.0–10.5)

## 2011-11-19 LAB — DIFFERENTIAL
Eosinophils Relative: 3 % (ref 0–5)
Monocytes Absolute: 0.7 10*3/uL (ref 0.1–1.0)
Monocytes Relative: 13 % — ABNORMAL HIGH (ref 3–12)
Neutrophils Relative %: 53 % (ref 43–77)

## 2011-11-19 LAB — PRO B NATRIURETIC PEPTIDE: Pro B Natriuretic peptide (BNP): 8854 pg/mL — ABNORMAL HIGH (ref 0–450)

## 2011-11-19 LAB — LIPASE, BLOOD: Lipase: 50 U/L (ref 11–59)

## 2011-11-19 LAB — PROTIME-INR
INR: 2.35 — ABNORMAL HIGH (ref 0.00–1.49)
Prothrombin Time: 26.1 seconds — ABNORMAL HIGH (ref 11.6–15.2)

## 2011-11-19 LAB — URINALYSIS, ROUTINE W REFLEX MICROSCOPIC
Glucose, UA: NEGATIVE mg/dL
Leukocytes, UA: NEGATIVE
Nitrite: NEGATIVE
Protein, ur: NEGATIVE mg/dL
Urobilinogen, UA: 1 mg/dL (ref 0.0–1.0)

## 2011-11-19 LAB — URINE MICROSCOPIC-ADD ON

## 2011-11-19 LAB — BASIC METABOLIC PANEL
CO2: 24 mEq/L (ref 19–32)
Calcium: 9 mg/dL (ref 8.4–10.5)
GFR calc Af Amer: 34 mL/min — ABNORMAL LOW (ref >90)
Sodium: 131 mEq/L — ABNORMAL LOW (ref 135–145)

## 2011-11-19 LAB — HEPATIC FUNCTION PANEL
Bilirubin, Direct: 0.3 mg/dL (ref 0.0–0.3)
Indirect Bilirubin: 0.4 mg/dL (ref 0.3–0.9)

## 2011-11-19 MED ORDER — DIPHENHYDRAMINE HCL 50 MG/ML IJ SOLN: 12.5000 mg | Freq: Once | INTRAMUSCULAR | Status: DC

## 2011-11-19 MED ORDER — SODIUM CHLORIDE 0.9 % IV SOLN
INTRAVENOUS | Status: DC
  Administered 2011-11-19: 22:00:00 via INTRAVENOUS

## 2011-11-19 MED ORDER — ONDANSETRON HCL 4 MG/2ML IJ SOLN
4.0000 mg | Freq: Once | INTRAMUSCULAR | Status: AC
  Administered 2011-11-19: 4 mg via INTRAVENOUS

## 2011-11-19 MED ORDER — KETOROLAC TROMETHAMINE 15 MG/ML IJ SOLN
15.0000 mg | Freq: Once | INTRAMUSCULAR | Status: AC
  Administered 2011-11-19: 15 mg via INTRAVENOUS
  Filled 2011-11-19: qty 1

## 2011-11-19 MED ORDER — METOCLOPRAMIDE HCL 5 MG/ML IJ SOLN: 10.0000 mg | Freq: Once | INTRAMUSCULAR | Status: DC

## 2011-11-19 MED ORDER — ONDANSETRON HCL 8 MG PO TABS: 8.0000 mg | ORAL_TABLET | Freq: Three times a day (TID) | ORAL | Status: AC | PRN

## 2011-11-19 MED ORDER — DEXAMETHASONE SODIUM PHOSPHATE 10 MG/ML IJ SOLN: 10.0000 mg | Freq: Once | INTRAMUSCULAR | Status: DC

## 2011-11-19 MED ORDER — ONDANSETRON HCL 4 MG/2ML IJ SOLN
INTRAMUSCULAR | Status: AC
  Administered 2011-11-19: 4 mg via INTRAVENOUS
  Filled 2011-11-19: qty 2

## 2011-11-19 MED ORDER — SODIUM CHLORIDE 0.9 % IV BOLUS (SEPSIS)
500.0000 mL | Freq: Once | INTRAVENOUS | Status: AC
  Administered 2011-11-19: 500 mL via INTRAVENOUS

## 2011-11-19 MED ORDER — ONDANSETRON HCL 8 MG PO TABS: 8.0000 mg | ORAL_TABLET | Freq: Three times a day (TID) | ORAL | Status: DC | PRN

## 2011-11-19 MED ORDER — KETOROLAC TROMETHAMINE 30 MG/ML IJ SOLN
INTRAMUSCULAR | Status: AC
  Filled 2011-11-19: qty 1

## 2011-11-19 NOTE — ED Notes (Signed)
Pt. Resting comfortably on stretcher. Family at bedside. No needs voiced at this time. 

## 2011-11-19 NOTE — ED Provider Notes (Signed)
Pt and daughter report pt c/o increasing weakness and persistent nausea x 1 week but much worse today. Daughter states pt has had very little appetite and w/ freq c/o nausea w/o vomiting and intermittent h/a's. Mild cough that has returned recently. Was treated by pulmonologist in 11/112 for cough and it had improved until recently. Has also recently been under care of urology for frequent "kidney/bladder" infections and was set to f/u w/ urology this coming Tuesday. Pt also hosp in 10/12 for "heart attack" and has h/o A-Fib. Pt denies recent fever, upper resp sx's, SOB, CP or abd pain. Denies hx of anemia. BBS CTA, abd is soft, nt w/ normal bs in all quads. PPP equal and strong. Daughter states they have not been to pt's PCP regarding these symptoms but then so much weaker today. Will; initiate appropriate labs and send to acute side for continued evaluation and monitoring.   Leanne Chang, NP 11/19/11 [phone]

## 2011-11-19 NOTE — ED Notes (Signed)
Pt. Assisted to bedside commode and then assisted back into bed and positioned for comfort.

## 2011-11-19 NOTE — ED Provider Notes (Addendum)
History     CSN: [id number]  Arrival date & time 11/19/11  1943   First MD Initiated Contact with Patient 11/19/11 2019      Chief Complaint  Patient presents with  . Nausea    (Consider location/radiation/quality/duration/timing/severity/associated sxs/prior treatment) HPI Comments: The patient is an 78 year old female with a history of atrial fibrillation chronic renal insufficiency, prior myocardial infarction, and diastolic dysfunction, as well as frequent urinary tract infections who presents for one day of nausea without vomiting, mild and nonproductive cough without shortness of breath or chest pain, slightly decreased oral intake, feeling generally weak and fatigued, and with a moderate generalized dull nonradiating headache. She denies any loss of consciousness, near syncope, palpitations, shortness of breath, chest pain, abdominal pain, dysuria, or urinary incontinence. She denies any focal weakness or numbness and denies any change in vision or hearing (the patient is hard of hearing).  She denies any diarrhea.  The history is provided by the patient and a relative.    Past Medical History  Diagnosis Date  . Thrombocytopenia   . Sleep related leg cramps   . Unspecified hearing loss   . HTN (hypertension)   . Lumbar pain   . Osteopenia   . CKD (chronic kidney disease), stage III   . Chronic constipation   . Unspecified urinary incontinence   . Hyponatremia   . Mitral valve disorders   . Disturbance of skin sensation   . Sciatica   . Unspecified cataract   . Lumbosacral spondylosis without myelopathy   . Edema   . HLD (hyperlipidemia)   . Other B-complex deficiencies   . Unspecified vitamin D deficiency   . Colon cancer 1961  . Ovarian cancer 1970  . History of kidney cancer 2005  . Atrial fibrillation     amiodarone; coumadin  . NSTEMI (non-ST elevated myocardial infarction)     in setting of hypoxic resp failure 9/12: ? type 2 NSTEMI vs. true ischemia (med  therapy chosen)  . Chronic diastolic heart failure     Echo 8/12: Moderate LVH, EF 55-60%, mild MR, mild BAE, PASP 60. Myoview 8/12: Negative for ischemia, normal LV function.   . DNR (do not resuscitate)   . Morbid obesity     Past Surgical History  Procedure Date  . Appendectomy 1945  . Ankle surgery     Pin placed and later removed  . 2-d echocardiogram 04/30/2011  . Negative myoview stress test 05/07/11    for ischemia or infarction. EF of 62%    Family History  Problem Relation Age of Onset  . Heart attack Son     History  Substance Use Topics  . Smoking status: Never Smoker   . Smokeless tobacco: Current User    Types: Snuff  . Alcohol Use: No    OB History    Grav Para Term Preterm Abortions TAB SAB Ect Mult Living                  Review of Systems  Constitutional: Positive for appetite change and fatigue. Negative for fever, chills, diaphoresis, activity change and unexpected weight change.  HENT: Negative for ear pain, congestion, sore throat, rhinorrhea, trouble swallowing, neck pain, neck stiffness, postnasal drip, sinus pressure and ear discharge.   Eyes: Negative for photophobia, pain, discharge, redness and visual disturbance.  Respiratory: Positive for cough. Negative for choking, chest tightness, shortness of breath and wheezing.   Cardiovascular: Negative for chest pain, palpitations and leg swelling.  Gastrointestinal:  Positive for nausea. Negative for vomiting, abdominal pain, diarrhea, constipation and abdominal distention.  Genitourinary: Negative for dysuria and flank pain.  Musculoskeletal: Negative for back pain.  Skin: Negative for color change, pallor and rash.  Neurological: Positive for headaches. Negative for dizziness, tremors, syncope, facial asymmetry, speech difficulty, light-headedness and numbness.  Hematological: Bruises/bleeds easily.  Psychiatric/Behavioral: Negative for confusion.    Allergies  Review of patient's allergies  indicates no known allergies.  Home Medications   Current Outpatient Rx  Name Route Sig Dispense Refill  . ACETAMINOPHEN 500 MG PO TABS Oral Take 500 mg by mouth 2 (two) times daily as needed. For pain fever    . ALENDRONATE SODIUM 10 MG PO TABS Oral Take 10 mg by mouth daily before breakfast. Take with a full glass of water on an empty stomach.    . AMIODARONE HCL 200 MG PO TABS Oral Take 200 mg by mouth 2 (two) times daily.      . ASPIRIN 81 MG PO TABS Oral Take 81 mg by mouth daily.      Marland Kitchen CETIRIZINE HCL 10 MG PO TABS Oral Take 10 mg by mouth daily.      Marland Kitchen DEXTROMETHORPHAN POLISTIREX ER 30 MG/5ML PO LQCR Oral Take 60 mg by mouth as needed. For cough    . FENOFIBRATE MICRONIZED 134 MG PO CAPS Oral Take 134 mg by mouth daily before breakfast.      . FUROSEMIDE 20 MG PO TABS Oral Take 1 tablet (20 mg total) by mouth daily. 30 tablet 11  . HYDROCODONE-ACETAMINOPHEN 5-500 MG PO TABS Oral Take 1 tablet by mouth every 6 (six) hours as needed. For pain    . HYDROCODONE-HOMATROPINE 5-1.5 MG/5ML PO SYRP Oral Take 5 mLs by mouth every 6 (six) hours as needed. 1/2 to 2 tsp every 6 hours as needed    . ISOSORBIDE MONONITRATE ER 30 MG PO TB24 Oral Take 60 mg by mouth daily.    Marland Kitchen LOVASTATIN 40 MG PO TABS Oral Take 40 mg by mouth at bedtime.      Marland Kitchen MECLIZINE HCL 25 MG PO TABS Oral Take 25 mg by mouth 3 (three) times daily as needed. For dizziness    . METOPROLOL SUCCINATE ER 25 MG PO TB24 Oral Take 0.5 tablets (12.5 mg total) by mouth daily.    Marland Kitchen NITROGLYCERIN 0.4 MG SL SUBL Sublingual Place 0.4 mg under the tongue every 5 (five) minutes as needed. For chest pain    . PANTOPRAZOLE SODIUM 40 MG PO TBEC Oral Take 1 tablet (40 mg total) by mouth daily. 30 tablet 6  . POTASSIUM CHLORIDE CRYS ER 20 MEQ PO TBCR Oral Take 20 mEq by mouth daily.    . WARFARIN SODIUM 1 MG PO TABS Oral Take 0.5-1 mg by mouth every evening. Sunday, Monday, Tuesday, Thursday, Friday, Saturday take 1mg ; Wednesday take 0.5mg       BP  180/70  Pulse 52  Temp(Src) 97.3 F (36.3 C) (Oral)  Resp 21  SpO2 94%  Physical Exam  Constitutional: She is oriented to person, place, and time. She appears well-developed and well-nourished. No distress.  HENT:  Head: Normocephalic and atraumatic.  Right Ear: External ear normal.  Left Ear: External ear normal.  Mouth/Throat: Oropharynx is clear and moist. No oropharyngeal exudate.  Eyes: Conjunctivae and EOM are normal. Pupils are equal, round, and reactive to light. No scleral icterus. Right eye exhibits no nystagmus. Left eye exhibits no nystagmus.  Fundoscopic exam:  The right eye shows no papilledema.       The left eye shows no papilledema.  Neck: Normal range of motion, full passive range of motion without pain and phonation normal. Neck supple. No JVD present. Carotid bruit is not present.  Cardiovascular: Normal rate, regular rhythm, normal heart sounds and intact distal pulses.  Exam reveals no gallop and no friction rub.   No murmur heard. Pulmonary/Chest: Effort normal and breath sounds normal. No respiratory distress. She has no wheezes. She has no rales.  Abdominal: Soft. Bowel sounds are normal. She exhibits no distension. There is no tenderness. There is no rebound and no guarding.  Musculoskeletal: Normal range of motion. She exhibits no edema and no tenderness.  Neurological: She is alert and oriented to person, place, and time. She has normal reflexes. She displays normal reflexes. No cranial nerve deficit or sensory deficit. She exhibits normal muscle tone. Coordination normal. GCS eye subscore is 4. GCS verbal subscore is 5. GCS motor subscore is 6.  Reflex Scores:      Bicep reflexes are 2+ on the right side and 2+ on the left side.      Brachioradialis reflexes are 2+ on the right side and 2+ on the left side.      Patellar reflexes are 2+ on the right side and 2+ on the left side. Skin: Skin is warm and dry. No rash noted. She is not diaphoretic.    Psychiatric: She has a normal mood and affect. Her behavior is normal. Judgment and thought content normal.    ED Course  Procedures (including critical care time)   Date: 11/19/2011  Rate: 55  Rhythm: normal sinus rhythm  QRS Axis: left  Intervals: QRS widening and left bundle branch block  ST/T Wave abnormalities: nonspecific ST/T changes  Conduction Disutrbances:left bundle branch block  Narrative Interpretation: Abnormal EKG but non-provocative when compared to prior EKG showing no significant changes.  Old EKG Reviewed: No significant changes    Labs Reviewed  CBC - Abnormal; Notable for the following:    Platelets 84 (*) PLATELET CLUMPS NOTED ON SMEAR, COUNT APPEARS DECREASED   All other components within normal limits  DIFFERENTIAL - Abnormal; Notable for the following:    Monocytes Relative 13 (*)    Basophils Relative 2 (*)    All other components within normal limits  BASIC METABOLIC PANEL - Abnormal; Notable for the following:    Sodium 131 (*)    Creatinine, Ser 1.59 (*)    GFR calc non Af Amer 29 (*)    GFR calc Af Amer 34 (*)    All other components within normal limits  URINALYSIS, ROUTINE W REFLEX MICROSCOPIC - Abnormal; Notable for the following:    Hgb urine dipstick LARGE (*)    All other components within normal limits  PRO B NATRIURETIC PEPTIDE - Abnormal; Notable for the following:    Pro B Natriuretic peptide (BNP) 8854.0 (*)    All other components within normal limits  HEPATIC FUNCTION PANEL - Abnormal; Notable for the following:    Albumin 3.4 (*)    All other components within normal limits  PROTIME-INR - Abnormal; Notable for the following:    Prothrombin Time 26.1 (*)    INR 2.35 (*)    All other components within normal limits  APTT - Abnormal; Notable for the following:    aPTT 49 (*)    All other components within normal limits  LIPASE, BLOOD  POCT I-STAT TROPONIN I  URINE MICROSCOPIC-ADD ON   Dg Chest 2 View  11/19/2011   *RADIOLOGY REPORT*  Clinical Data: Weakness and diaphoresis.  History of myocardial infarction.  CHEST - 2 VIEW  Comparison: 09/14/2011 and 05/16/2011.  Findings: The heart size and mediastinal contours are stable. There are new small bilateral pleural effusions with mild associated basilar atelectasis.  No edema or confluent airspace opacity is seen.  There are no acute osseous findings.  The subacromial space of the left shoulder is mildly narrowed.  IMPRESSION: New small bilateral pleural effusions with associated bibasilar atelectasis.  No overt pulmonary edema identified.  Original Report Authenticated By: Gerrianne Scale, M.D.     No diagnosis found.    MDM  Considerations in the patient's differential diagnosis included hepatitis, cholecystitis, pancreatitis, gastroenteritis, gastritis, viral syndrome, electrolyte abnormality, anemia, tension headache (with no focal neurologic deficits and a generalized dull headache of moderate intensity acute intracranial mass lesion or stroke or bleed is not suggested are thought likely), arrhythmia, occult myocardial infarction, pneumonia, congestive heart failure amongst other consider diagnoses.  11:16 PM At this time, the patient's workup reveals no acute abnormalities, no sign of urinary tract infection, mild microscopic hematuria, but the patient has a known kidney stone that is being managed by urology and renal function appears normal. Electrolytes are normal, no significant anemia or leukocytosis, no apparent pneumonia or congestive heart failure on chest x-ray, no acute abnormalities on EKG, no apparent myocardial infarction or ischemia suggested by troponin. At this time the patient feels significantly better with no further nausea, no vomiting, and relief of her headache. She states she feels well and wishes to go home. At this time I do not find a serious cause of her symptoms or reason to keep her in the hospital no discharge her home on  symptomatic treatment followup with her primary care physician as needed if symptoms persist. The patient and her accompanying family members state their understanding of and agreement with the plan of care.        Felisa Bonier, MD 11/19/11 2319  12:02 AM I was called back into the patient's room by the patient's nurse, as she had become short of breath, with cough, and worsening hypertension, hypoxia on room air to 85%, 94-95% on 2 L by nasal cannula (the patient is not on home oxygen), although she denies any chest pain or nausea. We are going to repeat a cardiac enzyme and EKG, and with her hypertension, established treatment with Lasix at home, elevated BNP, and pleural effusions on chest x-ray, I have ordered her IV Lasix and I will need to admit her to the hospital for congestive heart failure.  Felisa Bonier, MD 11/20/11 [phone]

## 2011-11-19 NOTE — ED Notes (Signed)
MD at bedside. Dr.Connor with patient

## 2011-11-19 NOTE — ED Notes (Signed)
EKG DONE BY EMT R Kameela Leipold 

## 2011-11-19 NOTE — Discharge Instructions (Signed)
Headaches, Frequently Asked Questions MIGRAINE HEADACHES Q: What is migraine? What causes it? How can I treat it? A: Generally, migraine headaches begin as a dull ache. Then they develop into a constant, throbbing, and pulsating pain. You may experience pain at the temples. You may experience pain at the front or back of one or both sides of the head. The pain is usually accompanied by a combination of:  Nausea.   Vomiting.   Sensitivity to light and noise.  Some people (about 15%) experience an aura (see below) before an attack. The cause of migraine is believed to be chemical reactions in the brain. Treatment for migraine may include over-the-counter or prescription medications. It may also include self-help techniques. These include relaxation training and biofeedback.  Q: What is an aura? A: About 15% of people with migraine get an "aura". This is a sign of neurological symptoms that occur before a migraine headache. You may see wavy or jagged lines, dots, or flashing lights. You might experience tunnel vision or blind spots in one or both eyes. The aura can include visual or auditory hallucinations (something imagined). It may include disruptions in smell (such as strange odors), taste or touch. Other symptoms include:  Numbness.   A "pins and needles" sensation.   Difficulty in recalling or speaking the correct word.  These neurological events may last as long as 60 minutes. These symptoms will fade as the headache begins. Q: What is a trigger? A: Certain physical or environmental factors can lead to or "trigger" a migraine. These include:  Foods.   Hormonal changes.   Weather.   Stress.  It is important to remember that triggers are different for everyone. To help prevent migraine attacks, you need to figure out which triggers affect you. Keep a headache diary. This is a good way to track triggers. The diary will help you talk to your healthcare professional about your  condition. Q: Does weather affect migraines? A: Bright sunshine, hot, humid conditions, and drastic changes in barometric pressure may lead to, or "trigger," a migraine attack in some people. But studies have shown that weather does not act as a trigger for everyone with migraines. Q: What is the link between migraine and hormones? A: Hormones start and regulate many of your body's functions. Hormones keep your body in balance within a constantly changing environment. The levels of hormones in your body are unbalanced at times. Examples are during menstruation, pregnancy, or menopause. That can lead to a migraine attack. In fact, about three quarters of all women with migraine report that their attacks are related to the menstrual cycle.  Q: Is there an increased risk of stroke for migraine sufferers? A: The likelihood of a migraine attack causing a stroke is very remote. That is not to say that migraine sufferers cannot have a stroke associated with their migraines. In persons under age 40, the most common associated factor for stroke is migraine headache. But over the course of a person's normal life span, the occurrence of migraine headache may actually be associated with a reduced risk of dying from cerebrovascular disease due to stroke.  Q: What are acute medications for migraine? A: Acute medications are used to treat the pain of the headache after it has started. Examples over-the-counter medications, NSAIDs, ergots, and triptans.  Q: What are the triptans? A: Triptans are the newest class of abortive medications. They are specifically targeted to treat migraine. Triptans are vasoconstrictors. They moderate some chemical reactions in the brain.   The triptans work on receptors in your brain. Triptans help to restore the balance of a neurotransmitter called serotonin. Fluctuations in levels of serotonin are thought to be a main cause of migraine.  Q: Are over-the-counter medications for migraine  effective? A: Over-the-counter, or "OTC," medications may be effective in relieving mild to moderate pain and associated symptoms of migraine. But you should see your caregiver before beginning any treatment regimen for migraine.  Q: What are preventive medications for migraine? A: Preventive medications for migraine are sometimes referred to as "prophylactic" treatments. They are used to reduce the frequency, severity, and length of migraine attacks. Examples of preventive medications include antiepileptic medications, antidepressants, beta-blockers, calcium channel blockers, and NSAIDs (nonsteroidal anti-inflammatory drugs). Q: Why are anticonvulsants used to treat migraine? A: During the past few years, there has been an increased interest in antiepileptic drugs for the prevention of migraine. They are sometimes referred to as "anticonvulsants". Both epilepsy and migraine may be caused by similar reactions in the brain.  Q: Why are antidepressants used to treat migraine? A: Antidepressants are typically used to treat people with depression. They may reduce migraine frequency by regulating chemical levels, such as serotonin, in the brain.  Q: What alternative therapies are used to treat migraine? A: The term "alternative therapies" is often used to describe treatments considered outside the scope of conventional Western medicine. Examples of alternative therapy include acupuncture, acupressure, and yoga. Another common alternative treatment is herbal therapy. Some herbs are believed to relieve headache pain. Always discuss alternative therapies with your caregiver before proceeding. Some herbal products contain arsenic and other toxins. TENSION HEADACHES Q: What is a tension-type headache? What causes it? How can I treat it? A: Tension-type headaches occur randomly. They are often the result of temporary stress, anxiety, fatigue, or anger. Symptoms include soreness in your temples, a tightening  band-like sensation around your head (a "vice-like" ache). Symptoms can also include a pulling feeling, pressure sensations, and contracting head and neck muscles. The headache begins in your forehead, temples, or the back of your head and neck. Treatment for tension-type headache may include over-the-counter or prescription medications. Treatment may also include self-help techniques such as relaxation training and biofeedback. CLUSTER HEADACHES Q: What is a cluster headache? What causes it? How can I treat it? A: Cluster headache gets its name because the attacks come in groups. The pain arrives with little, if any, warning. It is usually on one side of the head. A tearing or bloodshot eye and a runny nose on the same side of the headache may also accompany the pain. Cluster headaches are believed to be caused by chemical reactions in the brain. They have been described as the most severe and intense of any headache type. Treatment for cluster headache includes prescription medication and oxygen. SINUS HEADACHES Q: What is a sinus headache? What causes it? How can I treat it? A: When a cavity in the bones of the face and skull (a sinus) becomes inflamed, the inflammation will cause localized pain. This condition is usually the result of an allergic reaction, a tumor, or an infection. If your headache is caused by a sinus blockage, such as an infection, you will probably have a fever. An x-ray will confirm a sinus blockage. Your caregiver's treatment might include antibiotics for the infection, as well as antihistamines or decongestants.  REBOUND HEADACHES Q: What is a rebound headache? What causes it? How can I treat it? A: A pattern of taking acute headache medications too   often can lead to a condition known as "rebound headache." A pattern of taking too much headache medication includes taking it more than 2 days per week or in excessive amounts. That means more than the label or a caregiver advises.  With rebound headaches, your medications not only stop relieving pain, they actually begin to cause headaches. Doctors treat rebound headache by tapering the medication that is being overused. Sometimes your caregiver will gradually substitute a different type of treatment or medication. Stopping may be a challenge. Regularly overusing a medication increases the potential for serious side effects. Consult a caregiver if you regularly use headache medications more than 2 days per week or more than the label advises. ADDITIONAL QUESTIONS AND ANSWERS Q: What is biofeedback? A: Biofeedback is a self-help treatment. Biofeedback uses special equipment to monitor your body's involuntary physical responses. Biofeedback monitors:  Breathing.   Pulse.   Heart rate.   Temperature.   Muscle tension.   Brain activity.  Biofeedback helps you refine and perfect your relaxation exercises. You learn to control the physical responses that are related to stress. Once the technique has been mastered, you do not need the equipment any more. Q: Are headaches hereditary? A: Four out of five (80%) of people that suffer report a family history of migraine. Scientists are not sure if this is genetic or a family predisposition. Despite the uncertainty, a child has a 50% chance of having migraine if one parent suffers. The child has a 75% chance if both parents suffer.  Q: Can children get headaches? A: By the time they reach high school, most young people have experienced some type of headache. Many safe and effective approaches or medications can prevent a headache from occurring or stop it after it has begun.  Q: What type of doctor should I see to diagnose and treat my headache? A: Start with your primary caregiver. Discuss his or her experience and approach to headaches. Discuss methods of classification, diagnosis, and treatment. Your caregiver may decide to recommend you to a headache specialist, depending upon  your symptoms or other physical conditions. Having diabetes, allergies, etc., may require a more comprehensive and inclusive approach to your headache. The National Head[employer] Foundation will provide, upon request, a list of [facility] physician members in your state. Document Released: 11/21/2003 Document Revised: 08/20/2011 Document Reviewed: 04/30/2008 [facility] Patient Information 2012 [place], [place].Headache Headaches are caused by many different problems. Most commonly, headache is caused by muscle tension from an injury, fatigue, or emotional upset. Excessive muscle contractions in the scalp and neck result in a headache that often feels like a tight band around the head. Tension headaches often have areas of tenderness over the scalp and the back of the neck. These headaches may last for hours, days, or longer, and some may contribute to migraines in those who have migraine problems. Migraines usually cause a throbbing headache, which is made worse by activity. Sometimes only one side of the head hurts. Nausea, vomiting, eye pain, and avoidance of food are common with migraines. Visual symptoms such as light sensitivity, blind spots, or flashing lights may also occur. Loud noises may worsen migraine headaches. Many factors may cause migraine headaches:  Emotional stress, lack of sleep, and menstrual periods.   Alcohol and some drugs (such as birth control pills).   Diet factors (fasting, caffeine, food preservatives, chocolate).   Environmental factors (weather changes, bright lights, odors, smoke).  Other causes of headaches include minor injuries to the head. Arthritis in the  neck; problems with the jaw, eyes, ears, or nose are also causes of headaches. Allergies, drugs, alcohol, and exposure to smoke can also cause moderate headaches. Rebound headaches can occur if someone uses pain medications for a long period of time and then stops. Less commonly, blood vessel problems in the neck and brain  (including stroke) can cause various types of headache. Treatment of headaches includes medicines for pain and relaxation. Ice packs or heat applied to the back of the head and neck help some people. Massaging the shoulders, neck and scalp are often very useful. Relaxation techniques and stretching can help prevent these headaches. Avoid alcohol and cigarette smoking as these tend to make headaches worse. Please see your caregiver if your headache is not better in 2 days.  SEEK IMMEDIATE MEDICAL CARE IF:   You develop a high fever, chills, or repeated vomiting.   You faint or have difficulty with vision.   You develop unusual numbness or weakness of your arms or legs.   Relief of pain is inadequate with medication, or you develop severe pain.   You develop confusion, or neck stiffness.   You have a worsening of a headache or do not obtain relief.  Document Released: 08/31/2005 Document Revised: 08/20/2011 Document Reviewed: 02/24/2007 [facility] Patient Information 2012 [place], [place].Hematuria, Adult Hematuria (blood in your urine) can be caused by a bladder infection (cystitis), kidney infection (pyelonephritis), prostate infection (prostatitis), or kidney stone. Infections will usually respond to antibiotics (medications which kill germs), and a kidney stone will usually pass through your urine without further treatment. If you were put on antibiotics, take all the medicine until gone. You may feel better in a few days, but take all of your medicine or the infection may not respond and become more difficult to treat. If antibiotics were not given, an infection did not cause the blood in the urine. A further work up to find out the reason may be needed. HOME CARE INSTRUCTIONS   Drink lots of fluid, 3 to 4 quarts a day. If you have been diagnosed with an infection, cranberry juice is especially recommended, in addition to large amounts of water.   Avoid caffeine, tea, and carbonated beverages,  because they tend to irritate the bladder.   Avoid alcohol as it may irritate the prostate.   Only take over-the-counter or prescription medicines for pain, discomfort, or fever as directed by your caregiver.   If you have been diagnosed with a kidney stone follow your caregivers instructions regarding straining your urine to catch the stone.  TO PREVENT FURTHER INFECTIONS:  Empty the bladder often. Avoid holding urine for long periods of time.   After a bowel movement, women should cleanse front to back. Use each tissue only once.   Empty the bladder before and after sexual intercourse if you are a female.   Return to your caregiver if you develop back pain, fever, nausea (feeling sick to your stomach), vomiting, or your symptoms (problems) are not better in 3 days. Return sooner if you are getting worse.  If you have been requested to return for further testing make sure to keep your appointments. If an infection is not the cause of blood in your urine, X-rays may be required. Your caregiver will discuss this with you. SEEK IMMEDIATE MEDICAL CARE IF:   You have a persistent fever over 102 F (38.9 C).   You develop severe vomiting and are unable to keep the medication down.   You develop severe back or  abdominal pain despite taking your medications.   You begin passing a large amount of blood or clots in your urine.   You feel extremely weak or faint, or pass out.  MAKE SURE YOU:   Understand these instructions.   Will watch your condition.   Will get help right away if you are not doing well or get worse.  Document Released: 08/31/2005 Document Revised: 08/20/2011 Document Reviewed: 04/19/2008 [facility] Patient Information 2012 [place], [place].Nausea, Adult Nausea is the feeling that you have an upset stomach or have to vomit. Nausea by itself is not likely a serious concern, but it may be an early sign of more serious medical problems. As nausea gets worse, it can lead to  vomiting. If vomiting develops, there is the risk of dehydration.  CAUSES   Viral infections.   Food poisoning.   Medicines.   Pregnancy.   Motion sickness.   Migraine headaches.   Emotional distress.   Severe pain from any source.   Alcohol intoxication.  HOME CARE INSTRUCTIONS  Get plenty of rest.   Ask your caregiver about specific rehydration instructions.   Eat small amounts of food and sip liquids more often.   Take all medicines as told by your caregiver.  SEEK MEDICAL CARE IF:  You have not improved after 2 days, or you get worse.   You have a headache.  SEEK IMMEDIATE MEDICAL CARE IF:   You have a fever.   You faint.   You keep vomiting or have blood in your vomit.   You are extremely weak or dehydrated.   You have dark or bloody stools.   You have severe chest or abdominal pain.  MAKE SURE YOU:  Understand these instructions.   Will watch your condition.   Will get help right away if you are not doing well or get worse.  Document Released: 10/08/2004 Document Revised: 08/20/2011 Document Reviewed: 05/13/2011 [facility] Patient Information 2012 [place], [place].Tension Headache (Muscle Contraction Headache) Tension headache is one of the most common causes of head pain. These headaches are usually felt as a pain over the top of your head and back of your neck. Stress, anxiety, and depression are common triggers for these headaches. Tension headaches are not life-threatening and will not lead to other types of headaches. Tension headaches can often be diagnosed by taking a history from the patient and a physical exam. Sometimes, further lab and x-ray studies are used to confirm the diagnosis. Your caregiver can advise you on how to get help solving problems that cause anxiety or stress. Antidepressants can be prescribed if depression is a problem. HOME CARE INSTRUCTIONS   If testing was done, call for your results. Remember, it is your responsibility to  get the results of all testing. Do not assume everything is fine because you do not hear from your caregiver.   Only take over-the-counter or prescription medicines for pain, discomfort, or fever as directed by your caregiver.   Biofeedback, massage, or other relaxation techniques may be helpful.   Ice packs or heat to the head and neck can be used. Use these three to four times per day or as needed.   Physical therapy may be a useful addition to treatment.   If headaches continue, even with therapy, you may need to think about lifestyle changes.   Avoid excessive use of pain killers, as rebound headaches can occur.  SEEK MEDICAL CARE IF:   You develop problems with medications prescribed.   You do not  respond or get no relief from medications.   You have a change from the usual headache.   You develop nausea (feeling sick to your stomach) or vomiting.  SEEK IMMEDIATE MEDICAL CARE IF:   Your headache becomes severe.   You have an unexplained oral temperature above 102 F (38.9 C).   You develop a stiff neck.   You have loss of vision.   You have muscular weakness.   You have loss of muscular control.   You develop severe symptoms different from your first symptoms.   You start losing your balance or have trouble walking.   You feel faint or pass out.  MAKE SURE YOU:   Understand these instructions.   Will watch your condition.   Will get help right away if you are not doing well or get worse.  Document Released: 08/31/2005 Document Revised: 08/20/2011 Document Reviewed: 04/19/2008 [facility] Patient Information 2012 [place], [place].

## 2011-11-19 NOTE — ED Provider Notes (Addendum)
Evaluation and management procedures were performed by the PA/NP under my supervision/collaboration.  I evaluated this patient face-to-face at the time of encounter.  Please see my note dated at that time.   Felisa Bonier, MD 11/19/11 2323  12:05 AM  Date: 11/20/2011  Rate: 74  Rhythm: normal sinus rhythm  QRS Axis: left  Intervals: QRS widening in left bundle branch block, first degree AV block  ST/T Wave abnormalities: nonspecific ST/T changes  Conduction Disutrbances:left bundle branch block  Narrative Interpretation:   Old EKG Reviewed: No significant changes from ECG earlier today.    Felisa Bonier, MD 11/20/11 [phone]

## 2011-11-19 NOTE — ED Notes (Signed)
PT. REPORTS FEELING NAUSEATED WITH CHILLS /SWEATS ONSET TODAY , DENIES SOB OR CHEST DISCOMFORT. NO FEVER.

## 2011-11-20 ENCOUNTER — Other Ambulatory Visit: Payer: Self-pay

## 2011-11-20 ENCOUNTER — Encounter (HOSPITAL_COMMUNITY): Payer: Self-pay | Admitting: Internal Medicine

## 2011-11-20 DIAGNOSIS — I5033 Acute on chronic diastolic (congestive) heart failure: Principal | ICD-10-CM | POA: Diagnosis present

## 2011-11-20 DIAGNOSIS — R0902 Hypoxemia: Secondary | ICD-10-CM | POA: Diagnosis present

## 2011-11-20 DIAGNOSIS — R319 Hematuria, unspecified: Secondary | ICD-10-CM | POA: Diagnosis present

## 2011-11-20 DIAGNOSIS — R110 Nausea: Secondary | ICD-10-CM | POA: Diagnosis present

## 2011-11-20 MED ORDER — WARFARIN SODIUM 1 MG PO TABS
1.5000 mg | ORAL_TABLET | Freq: Once | ORAL | Status: AC
  Administered 2011-11-20: 1.5 mg via ORAL
  Filled 2011-11-20: qty 1

## 2011-11-20 MED ORDER — PROMETHAZINE HCL 25 MG PO TABS
12.5000 mg | ORAL_TABLET | Freq: Four times a day (QID) | ORAL | Status: DC | PRN
  Administered 2011-11-20: 12.5 mg via ORAL
  Filled 2011-11-20: qty 1

## 2011-11-20 MED ORDER — ASPIRIN 81 MG PO TABS: 81.0000 mg | ORAL_TABLET | Freq: Every day | ORAL | Status: DC

## 2011-11-20 MED ORDER — ALENDRONATE SODIUM 10 MG PO TABS: 10.0000 mg | ORAL_TABLET | Freq: Every day | ORAL | Status: DC

## 2011-11-20 MED ORDER — METOPROLOL SUCCINATE 12.5 MG HALF TABLET
12.5000 mg | ORAL_TABLET | Freq: Every day | ORAL | Status: DC
  Administered 2011-11-20 – 2011-11-21 (×2): 12.5 mg via ORAL
  Filled 2011-11-20 (×2): qty 1

## 2011-11-20 MED ORDER — FUROSEMIDE 10 MG/ML IJ SOLN
20.0000 mg | Freq: Three times a day (TID) | INTRAMUSCULAR | Status: AC
  Administered 2011-11-20 (×2): 20 mg via INTRAVENOUS
  Filled 2011-11-20 (×3): qty 2

## 2011-11-20 MED ORDER — SIMVASTATIN 20 MG PO TABS
20.0000 mg | ORAL_TABLET | Freq: Every day | ORAL | Status: DC
  Administered 2011-11-20: 20 mg via ORAL
  Filled 2011-11-20 (×2): qty 1

## 2011-11-20 MED ORDER — WARFARIN - PHYSICIAN DOSING INPATIENT: Freq: Every day | Status: DC

## 2011-11-20 MED ORDER — FUROSEMIDE 10 MG/ML IJ SOLN
40.0000 mg | Freq: Once | INTRAMUSCULAR | Status: AC
  Administered 2011-11-20: 40 mg via INTRAVENOUS
  Filled 2011-11-20: qty 4

## 2011-11-20 MED ORDER — ONDANSETRON HCL 4 MG/2ML IJ SOLN: 4.0000 mg | Freq: Four times a day (QID) | INTRAMUSCULAR | Status: DC | PRN

## 2011-11-20 MED ORDER — SODIUM CHLORIDE 0.9 % IV SOLN: 250.0000 mL | INTRAVENOUS | Status: DC | PRN

## 2011-11-20 MED ORDER — ISOSORBIDE MONONITRATE ER 60 MG PO TB24
60.0000 mg | ORAL_TABLET | Freq: Every day | ORAL | Status: DC
  Administered 2011-11-20 – 2011-11-21 (×2): 60 mg via ORAL
  Filled 2011-11-20 (×2): qty 1

## 2011-11-20 MED ORDER — ACETAMINOPHEN 325 MG PO TABS: 650.0000 mg | ORAL_TABLET | ORAL | Status: DC | PRN

## 2011-11-20 MED ORDER — SODIUM CHLORIDE 0.9 % IJ SOLN
3.0000 mL | Freq: Two times a day (BID) | INTRAMUSCULAR | Status: DC
  Administered 2011-11-20 – 2011-11-21 (×4): 3 mL via INTRAVENOUS

## 2011-11-20 MED ORDER — ASPIRIN 81 MG PO CHEW
324.0000 mg | CHEWABLE_TABLET | Freq: Once | ORAL | Status: AC
  Administered 2011-11-20: 324 mg via ORAL
  Filled 2011-11-20: qty 4

## 2011-11-20 MED ORDER — ASPIRIN 81 MG PO CHEW
81.0000 mg | CHEWABLE_TABLET | Freq: Every day | ORAL | Status: DC
  Administered 2011-11-20 – 2011-11-21 (×2): 81 mg via ORAL
  Filled 2011-11-20 (×2): qty 1

## 2011-11-20 MED ORDER — HEPARIN SODIUM (PORCINE) 5000 UNIT/ML IJ SOLN
5000.0000 [IU] | Freq: Three times a day (TID) | INTRAMUSCULAR | Status: DC
  Administered 2011-11-20: 5000 [IU] via SUBCUTANEOUS
  Filled 2011-11-20 (×5): qty 1

## 2011-11-20 MED ORDER — POTASSIUM CHLORIDE CRYS ER 20 MEQ PO TBCR
20.0000 meq | EXTENDED_RELEASE_TABLET | Freq: Every day | ORAL | Status: DC
  Administered 2011-11-20 – 2011-11-21 (×2): 20 meq via ORAL
  Filled 2011-11-20 (×2): qty 1

## 2011-11-20 MED ORDER — AMIODARONE HCL 200 MG PO TABS
200.0000 mg | ORAL_TABLET | Freq: Two times a day (BID) | ORAL | Status: DC
  Administered 2011-11-20 – 2011-11-21 (×3): 200 mg via ORAL
  Filled 2011-11-20 (×4): qty 1

## 2011-11-20 MED ORDER — SODIUM CHLORIDE 0.9 % IJ SOLN
3.0000 mL | INTRAMUSCULAR | Status: DC | PRN
  Administered 2011-11-20: 3 mL via INTRAVENOUS

## 2011-11-20 MED ORDER — HYDRALAZINE HCL 20 MG/ML IJ SOLN
10.0000 mg | Freq: Three times a day (TID) | INTRAMUSCULAR | Status: DC | PRN
  Filled 2011-11-20: qty 0.5

## 2011-11-20 NOTE — Progress Notes (Signed)
ANTICOAGULATION CONSULT NOTE - Initial Consult  Pharmacy Consult for Coumadin Indication: atrial fibrillation  No Known Allergies  Patient Measurements: Height: 5\' 5"  (165.1 cm) Weight: 165 lb 12.6 oz (75.2 kg) IBW/kg (Calculated) : 57    Vital Signs: Temp: 97.5 F (36.4 C) (03/08 0526) BP: 176/82 mmHg (03/08 0526) Pulse Rate: 57  (03/08 0526)  Labs:  Basename 11/20/11 0022 11/19/11 2132 11/19/11 2056  HGB -- -- 13.7  HCT -- -- 39.9  PLT -- -- 84*  APTT -- 49* --  LABPROT -- 26.1* --  INR -- 2.35* --  HEPARINUNFRC -- -- --  CREATININE -- -- 1.59*  CKTOTAL -- -- --  CKMB -- -- --  TROPONINI <0.30 -- --   Estimated Creatinine Clearance: 27.7 ml/min (by C-G formula based on Cr of 1.59).  Medical History: Past Medical History  Diagnosis Date  . Thrombocytopenia   . Sleep related leg cramps   . Unspecified hearing loss   . HTN (hypertension)   . Lumbar pain   . Osteopenia   . CKD (chronic kidney disease), stage III   . Chronic constipation   . Unspecified urinary incontinence   . Hyponatremia   . Mitral valve disorders   . Disturbance of skin sensation   . Sciatica   . Unspecified cataract   . Lumbosacral spondylosis without myelopathy   . Edema   . HLD (hyperlipidemia)   . Other B-complex deficiencies   . Unspecified vitamin D deficiency   . Colon cancer 1961  . Ovarian cancer 1970  . History of kidney cancer 2005  . Atrial fibrillation     amiodarone; coumadin  . NSTEMI (non-ST elevated myocardial infarction)     in setting of hypoxic resp failure 9/12: ? type 2 NSTEMI vs. true ischemia (med therapy chosen)  . Chronic diastolic heart failure     Echo 8/12: Moderate LVH, EF 55-60%, mild MR, mild BAE, PASP 60. Myoview 8/12: Negative for ischemia, normal LV function.   . DNR (do not resuscitate)   . Morbid obesity     Medications:  Prescriptions prior to admission  Medication Sig Dispense Refill  . acetaminophen (TYLENOL) 500 MG tablet Take 500 mg  by mouth 2 (two) times daily as needed. For pain fever      . alendronate (FOSAMAX) 10 MG tablet Take 10 mg by mouth daily before breakfast. Take with a full glass of water on an empty stomach.      Marland Kitchen amiodarone (PACERONE) 200 MG tablet Take 200 mg by mouth 2 (two) times daily.        Marland Kitchen aspirin 81 MG tablet Take 81 mg by mouth daily.        . cetirizine (ZYRTEC) 10 MG tablet Take 10 mg by mouth daily.        Marland Kitchen dextromethorphan (DELSYM) 30 MG/5ML liquid Take 60 mg by mouth as needed. For cough      . fenofibrate micronized (LOFIBRA) 134 MG capsule Take 134 mg by mouth daily before breakfast.        . furosemide (LASIX) 20 MG tablet Take 1 tablet (20 mg total) by mouth daily.  30 tablet  11  . HYDROcodone-acetaminophen (VICODIN) 5-500 MG per tablet Take 1 tablet by mouth every 6 (six) hours as needed. For pain      . HYDROcodone-homatropine (HYCODAN) 5-1.5 MG/5ML syrup Take 5 mLs by mouth every 6 (six) hours as needed. 1/2 to 2 tsp every 6 hours as needed      .  isosorbide mononitrate (IMDUR) 30 MG 24 hr tablet Take 60 mg by mouth daily.      Marland Kitchen lovastatin (MEVACOR) 40 MG tablet Take 40 mg by mouth at bedtime.        . meclizine (ANTIVERT) 25 MG tablet Take 25 mg by mouth 3 (three) times daily as needed. For dizziness      . metoprolol succinate (TOPROL XL) 25 MG 24 hr tablet Take 0.5 tablets (12.5 mg total) by mouth daily.      . nitroGLYCERIN (NITROSTAT) 0.4 MG SL tablet Place 0.4 mg under the tongue every 5 (five) minutes as needed. For chest pain      . pantoprazole (PROTONIX) 40 MG tablet Take 1 tablet (40 mg total) by mouth daily.  30 tablet  6  . potassium chloride SA (K-DUR,KLOR-CON) 20 MEQ tablet Take 20 mEq by mouth daily.      Marland Kitchen warfarin (COUMADIN) 1 MG tablet Take 0.5-1 mg by mouth every evening. Sunday, Monday, Tuesday, Thursday, Friday, Saturday take 1mg ; Wednesday take 0.5mg         Assessment: 78 year old on Coumadin PTA for Afib.  INR therapeutic at admission. INR=2.35 Missed dose  3.7.13 Dose PTA = 1 mg every day except Wednesday  Goal of Therapy:  INR 2-3   Plan:  1) Coumadin 1.5 mg po x 1 dose today 2) Resume home dose tomorrow? 3) DC sq heparin 4) Daily PT INR  Elwin Sleight 11/20/2011,10:48 AM

## 2011-11-20 NOTE — Progress Notes (Signed)
   CARE MANAGEMENT NOTE 11/20/2011  Patient:  [name], [name]   Account Number:  [id number]  Date Initiated:  11/20/2011  Documentation initiated by:  Donn Pierini  Subjective/Objective Assessment:   Pt admitted with acute on chronic CHF     Action/Plan:   PTA pt lived at home with daughter, PT eval ordered   Anticipated DC Date:  11/23/2011   Anticipated DC Plan:  HOME/SELF CARE      DC Planning Services  CM consult      Choice offered to / List presented to:             Status of service:  In process, will continue to follow Medicare Important Message given?   (If response is "NO", the following Medicare IM given date fields will be blank) Date Medicare IM given:   Date Additional Medicare IM given:    Discharge Disposition:    Per UR Regulation:    Comments:  PCP- [place]  11/20/11- 1130- Donn Pierini RN, BSN [phone] Spoke with pt and son [name] at bedside, per conversation pt states that she lives with her daughter [name] at home, uses walker. Pt has medication coverage and uses [facility]. Pt and son state that pt has never had HH or done outpt thereapy. PT did speak with them regarding recommendation for outpt therapy and pt's son states that [name] would be the closet outpt location to them. Pt going to think about outpt therapy. Pt will need outpt PT script from MD prior to d/c- if she is going to do outpt therapy.  CM to continue to follow for d/c needs/planning.

## 2011-11-20 NOTE — H&P (Signed)
PCP:  Ailene Ravel, MD, MD Confirmed with pt Seen at [facility] cardiology  Chief Complaint:  Nausea and sweats  HPI: 84yoF with h/o AFib/Coumadin, diastolic HF (EF 60%), CKD stage III, NSTEMI 04/2011 presents with  chronic nausea and sweats and found in ER to have acute on chronic dCHF with acute respiratory  distress and hypoxia.   Pt was last admitted by cardiology in 04/2011 with chest discomfort and SOB, and while in the ED  initially was stable, then got acutely respiratorily distressed and required oxygen. During  admission had NSTEMI with Trop 1.1, acute on chronic diastolic HF. The pt and family decided on  medical therapy and she was diuresed. She was then cardioverted 10/24 but had recurrent AFib a few  days later. She has known bradycardia, as noted in outpt [facility] cardiology notes.   Her daughter states that since her MI she has daily early morning chronic nausea, but this usually  clears up through the day, however today her nausea persisted through the day, and not associated  with constipation (has BM every other day), abdominal pain, diarrhea, or any vomiting. Also for  the past couple days she's had some sweats, but no fevers or chills. For the persistent nausea,  she was brought to the ED.   In the ED vitals stable except HR 49-56. Labs wtih hypoNa 131, renal 17/1.59. CBC with new  thrombocytopenia to 84. Trop 0.03, BNP 8854. INR 2.35. UA with large Hgb and 21-50 RBC's. CXR with  new small bilateral pleural effusions and bibasilar atelectasis, but no overt pulmonary edema. She  was actually being readied for discharge when she got up and was acutely SOB in moderate  respiratory distress, wheezy, and noted to desat to 85% on RA. Of note, she had been given 500 cc  NS in the ED, and after this episode was given 40 mg Lasix. Also 324 ASA.   Daughter who lives next to her states her cardiopulmonary symptoms are not any different, she  doesn't actually have any dyspnea,  chest pain, palpitations, or orthopnea (she sleep upright in  recliner due to arthritis pain). Also denies any leg swelling, and her weight is stable within 3  lbs, and has been taking her Lasix. Daughter is aware of known hematuria and chronic UTI's. ROS  o/w unremarkable.   Past Medical History  Diagnosis Date  . Thrombocytopenia   . Sleep related leg cramps   . Unspecified hearing loss   . HTN (hypertension)   . Lumbar pain   . Osteopenia   . CKD (chronic kidney disease), stage III   . Chronic constipation   . Unspecified urinary incontinence   . Hyponatremia   . Mitral valve disorders   . Disturbance of skin sensation   . Sciatica   . Unspecified cataract   . Lumbosacral spondylosis without myelopathy   . Edema   . HLD (hyperlipidemia)   . Other B-complex deficiencies   . Unspecified vitamin D deficiency   . Colon cancer 1961  . Ovarian cancer 1970  . History of kidney cancer 2005  . Atrial fibrillation     amiodarone; coumadin  . NSTEMI (non-ST elevated myocardial infarction)     in setting of hypoxic resp failure 9/12: ? type 2 NSTEMI vs. true ischemia (med therapy chosen)  . Chronic diastolic heart failure     Echo 8/12: Moderate LVH, EF 55-60%, mild MR, mild BAE, PASP 60. Myoview 8/12: Negative for ischemia, normal LV function.   Marland Kitchen  DNR (do not resuscitate)   . Morbid obesity     Past Surgical History  Procedure Date  . Appendectomy 1945  . Ankle surgery     Pin placed and later removed  . 2-d echocardiogram 04/30/2011  . Negative myoview stress test 05/07/11    for ischemia or infarction. EF of 62%    Medications:  HOME MEDS: Reconciled by daughter's list of meds  Prior to Admission medications   Medication Sig Start Date End Date Taking? Authorizing Provider  acetaminophen (TYLENOL) 500 MG tablet Take 500 mg by mouth 2 (two) times daily as needed. For pain fever   Yes Historical Provider, MD  alendronate (FOSAMAX) 10 MG tablet Take 10 mg by mouth daily  before breakfast. Take with a full glass of water on an empty stomach.   Yes Historical Provider, MD  amiodarone (PACERONE) 200 MG tablet Take 200 mg by mouth 2 (two) times daily.   08/05/11  Yes Lewayne Bunting, MD  aspirin 81 MG tablet Take 81 mg by mouth daily.     Yes Historical Provider, MD  cetirizine (ZYRTEC) 10 MG tablet Take 10 mg by mouth daily.     Yes Historical Provider, MD  dextromethorphan (DELSYM) 30 MG/5ML liquid Take 60 mg by mouth as needed. For cough   Yes Historical Provider, MD  fenofibrate micronized (LOFIBRA) 134 MG capsule Take 134 mg by mouth daily before breakfast.     Yes Historical Provider, MD  furosemide (LASIX) 20 MG tablet Take 1 tablet (20 mg total) by mouth daily. 07/16/11  Yes Beatrice Lecher, PA  HYDROcodone-acetaminophen (VICODIN) 5-500 MG per tablet Take 1 tablet by mouth every 6 (six) hours as needed. For pain   Yes Historical Provider, MD  HYDROcodone-homatropine (HYCODAN) 5-1.5 MG/5ML syrup Take 5 mLs by mouth every 6 (six) hours as needed. 1/2 to 2 tsp every 6 hours as needed   Yes Historical Provider, MD  isosorbide mononitrate (IMDUR) 30 MG 24 hr tablet Take 60 mg by mouth daily.   Yes Historical Provider, MD  lovastatin (MEVACOR) 40 MG tablet Take 40 mg by mouth at bedtime.     Yes Historical Provider, MD  meclizine (ANTIVERT) 25 MG tablet Take 25 mg by mouth 3 (three) times daily as needed. For dizziness   Yes Historical Provider, MD  metoprolol succinate (TOPROL XL) 25 MG 24 hr tablet Take 0.5 tablets (12.5 mg total) by mouth daily. 11/04/11  Yes Beatrice Lecher, PA  nitroGLYCERIN (NITROSTAT) 0.4 MG SL tablet Place 0.4 mg under the tongue every 5 (five) minutes as needed. For chest pain   Yes Historical Provider, MD  pantoprazole (PROTONIX) 40 MG tablet Take 1 tablet (40 mg total) by mouth daily. 04/22/11 04/21/12 Yes Bevelyn Buckles Bensimhon, MD  potassium chloride SA (K-DUR,KLOR-CON) 20 MEQ tablet Take 20 mEq by mouth daily.   Yes Historical Provider, MD  warfarin  (COUMADIN) 1 MG tablet Take 0.5-1 mg by mouth every evening. Sunday, Monday, Tuesday, Thursday, Friday, Saturday take 1mg ; Wednesday take 0.5mg  08/31/11 08/30/12 Yes Bevelyn Buckles Bensimhon, MD  ondansetron (ZOFRAN) 8 MG tablet Take 1 tablet (8 mg total) by mouth every 8 (eight) hours as needed for nausea. 11/19/11 11/26/11  Felisa Bonier, MD    Allergies:  No Known Allergies  Social History:   reports that she has never smoked. Her smokeless tobacco use includes Snuff. She reports that she does not drink alcohol or use illicit drugs.  Lives at home with daughter close by.  [name] [phone].   Family History: Family History  Problem Relation Age of Onset  . Heart attack Son     Physical Exam: Filed Vitals:   11/20/11 0130 11/20/11 0145 11/20/11 0200 11/20/11 0215  BP: 162/64 172/77 160/69 154/90  Pulse: 51 52 52 53  Temp:      TempSrc:      Resp: 20 21 21 24   SpO2: 96% 96% 98% 98%   Blood pressure 154/90, pulse 53, temperature 97.3 F (36.3 C), temperature source Oral, resp. rate 24, SpO2 98.00%. Gen: Large F in ED stretcher, appears flat affected and fatigued, is very hard of hearing and  defers conversation to daughter. Is breathing very comfortably though, no increased WOB or  accessory muscle use. HEENT: Pupils round, reactive, EOMI, sclera clear and normal. Mouth moist and normal Lungs: Bibasilar pan inspiratory crackles appreciated, up to mid lung on the left (higher on left  than right) but no wheezes, good air movement Heart: regular and slow, but without apparent m/g  Abd: Obese with pannus, but soft, non tender, no facial grimacing, very benign  Extrem: Warm feet and hands, good bulk, no BLE edema noted at all Neuro: Alert, attentive but hard of hearing so doesn't talk much, able to sit up in bed on her  own, grossly non-focal.   Labs & Imaging Results for orders placed during the hospital encounter of 11/19/11 (from the past 48 hour(s))  CBC      Status: Abnormal   Collection Time   11/19/11  8:56 PM      Component Value Range Comment   WBC 5.4  4.0 - 10.5 (K/uL) Dyckman COUNT CONFIRMED ON SMEAR   RBC 4.68  3.87 - 5.11 (MIL/uL)    Hemoglobin 13.7  12.0 - 15.0 (g/dL)    HCT 82.9  56.2 - 13.0 (%)    MCV 85.3  78.0 - 100.0 (fL)    MCH 29.3  26.0 - 34.0 (pg)    MCHC 34.3  30.0 - 36.0 (g/dL)    RDW 86.5  78.4 - 69.6 (%)    Platelets 84 (*) 150 - 400 (K/uL) PLATELET CLUMPS NOTED ON SMEAR, COUNT APPEARS DECREASED  DIFFERENTIAL     Status: Abnormal   Collection Time   11/19/11  8:56 PM      Component Value Range Comment   Neutrophils Relative 53  43 - 77 (%)    Lymphocytes Relative 29  12 - 46 (%)    Monocytes Relative 13 (*) 3 - 12 (%)    Eosinophils Relative 3  0 - 5 (%)    Basophils Relative 2 (*) 0 - 1 (%)    Neutro Abs 2.8  1.7 - 7.7 (K/uL)    Lymphs Abs 1.6  0.7 - 4.0 (K/uL)    Monocytes Absolute 0.7  0.1 - 1.0 (K/uL)    Eosinophils Absolute 0.2  0.0 - 0.7 (K/uL)    Basophils Absolute 0.1  0.0 - 0.1 (K/uL)    WBC Morphology ATYPICAL LYMPHOCYTES     BASIC METABOLIC PANEL     Status: Abnormal   Collection Time   11/19/11  8:56 PM      Component Value Range Comment   Sodium 131 (*) 135 - 145 (mEq/L)    Potassium 3.8  3.5 - 5.1 (mEq/L)    Chloride 98  96 - 112 (mEq/L)    CO2 24  19 - 32 (mEq/L)    Glucose, Bld 94  70 -  99 (mg/dL)    BUN 17  6 - 23 (mg/dL)    Creatinine, Ser 4.09 (*) 0.50 - 1.10 (mg/dL)    Calcium 9.0  8.4 - 10.5 (mg/dL)    GFR calc non Af Amer 29 (*) >90 (mL/min)    GFR calc Af Amer 34 (*) >90 (mL/min)   PRO B NATRIURETIC PEPTIDE     Status: Abnormal   Collection Time   11/19/11  9:02 PM      Component Value Range Comment   Pro B Natriuretic peptide (BNP) 8854.0 (*) 0 - 450 (pg/mL)   POCT I-STAT TROPONIN I     Status: Normal   Collection Time   11/19/11  9:17 PM      Component Value Range Comment   Troponin i, poc 0.03  0.00 - 0.08 (ng/mL)    Comment 3            HEPATIC FUNCTION PANEL     Status: Abnormal     Collection Time   11/19/11  9:32 PM      Component Value Range Comment   Total Protein 6.6  6.0 - 8.3 (g/dL)    Albumin 3.4 (*) 3.5 - 5.2 (g/dL)    AST 29  0 - 37 (U/L)    ALT 12  0 - 35 (U/L)    Alkaline Phosphatase 43  39 - 117 (U/L)    Total Bilirubin 0.7  0.3 - 1.2 (mg/dL)    Bilirubin, Direct 0.3  0.0 - 0.3 (mg/dL)    Indirect Bilirubin 0.4  0.3 - 0.9 (mg/dL)   LIPASE, BLOOD     Status: Normal   Collection Time   11/19/11  9:32 PM      Component Value Range Comment   Lipase 50  11 - 59 (U/L)   PROTIME-INR     Status: Abnormal   Collection Time   11/19/11  9:32 PM      Component Value Range Comment   Prothrombin Time 26.1 (*) 11.6 - 15.2 (seconds)    INR 2.35 (*) 0.00 - 1.49    APTT     Status: Abnormal   Collection Time   11/19/11  9:32 PM      Component Value Range Comment   aPTT 49 (*) 24 - 37 (seconds)   URINALYSIS, ROUTINE W REFLEX MICROSCOPIC     Status: Abnormal   Collection Time   11/19/11 10:01 PM      Component Value Range Comment   Color, Urine YELLOW  YELLOW     APPearance CLEAR  CLEAR     Specific Gravity, Urine 1.015  1.005 - 1.030     pH 6.0  5.0 - 8.0     Glucose, UA NEGATIVE  NEGATIVE (mg/dL)    Hgb urine dipstick LARGE (*) NEGATIVE     Bilirubin Urine NEGATIVE  NEGATIVE     Ketones, ur NEGATIVE  NEGATIVE (mg/dL)    Protein, ur NEGATIVE  NEGATIVE (mg/dL)    Urobilinogen, UA 1.0  0.0 - 1.0 (mg/dL)    Nitrite NEGATIVE  NEGATIVE     Leukocytes, UA NEGATIVE  NEGATIVE    URINE MICROSCOPIC-ADD ON     Status: Normal   Collection Time   11/19/11 10:01 PM      Component Value Range Comment   Squamous Epithelial / LPF RARE  RARE     WBC, UA 0-2  <3 (WBC/hpf)    RBC / HPF 21-50  <3 (RBC/hpf)    Bacteria,  UA RARE  RARE    TROPONIN I     Status: Normal   Collection Time   11/20/11 12:22 AM      Component Value Range Comment   Troponin I <0.30  <0.30 (ng/mL)    Dg Chest 2 View  11/19/2011  *RADIOLOGY REPORT*  Clinical Data: Weakness and diaphoresis.  History of  myocardial infarction.  CHEST - 2 VIEW  Comparison: 09/14/2011 and 05/16/2011.  Findings: The heart size and mediastinal contours are stable. There are new small bilateral pleural effusions with mild associated basilar atelectasis.  No edema or confluent airspace opacity is seen.  There are no acute osseous findings.  The subacromial space of the left shoulder is mildly narrowed.  IMPRESSION: New small bilateral pleural effusions with associated bibasilar atelectasis.  No overt pulmonary edema identified.  Original Report Authenticated By: Gerrianne Scale, M.D.    ECG: NSR 55 bpm, LAD with IVCD, QRS is 140 and was 122 prior, V1-2 Q waves with poor RWP, no ST deviations. In comparison to prior, inverted TW v3-6 on prior have now normalized.   Negative myoview 04/2011 with EF 62% Echo 04/2011 with normal LV fxn, EF 55-60%, mild MR, PA pressure 60  Impression Present on Admission:  .Acute on chronic diastolic heart failure .Hypoxia .Nausea .Hematuria .CKD (chronic kidney disease)  84yoF with h/o AFib/Coumadin, diastolic HF (EF 60%), CKD stage III, NSTEMI 04/2011 presents with  chronic nausea and sweats and found in ER to have acute on chronic dCHF with acute respiratory  distress and hypoxia.   1. Hypoxia, new small bilateral pleural effusions, BNP 8800 (prior 400): This all likely  represents acute on chronic diastolic HF, however given no worsened cardiopulmonary symptoms in  the recent past, I do not think this is the reason for presentation/admission but somewhat an  incidental finding. The acute decompensation in the ED is probably due to having gotten 500 cc  bolus, and getting her diuresed a bit before discharge will be the goal for this admission.   - Admit observation  - Continue IV Lasix boluses 20 mg IV q8 x2 more doses. Trend cardiac enzymes x2 more times, repeat  ECG in the am.  - Continue home imdur, toprol, amiodarone, ASA 81, statin Holding home lasix PO  2. Nausea,  sweats: These are her chief complaints, and the nausea has been a chronic ongoing issue  for months, it was just persistent today. There are no documented fevers and w/u so far is  negative, no leukocytosis, and her abdomen is very benign. Overall, given numerous medical changes  in the past several months and known bradycardia noted at outpt cards office, I suspect this may  be medication related and advised her daughter to talk with outpt providers about this.  Alternatively consider GI consultation as outpt.   - Continue home meds, will not change her regimen acutely.   3. CKD: Cr appears to be within her baseline, not active issue.   4. Hematuria: Pt with INR 2.35. Per last outpt cardiology notes, she has frequent bladder infxns  and sees urology, Dr. Harvin Hazel in [place], and has known significant hematuria. Again, also defer  this to known outpt providers.   Telemetry, team 6  Noted to be DNR 04/2011, this was discussed and confirmed DNR  Other plans as per orders.  [name] 11/20/2011, 2:32 AM

## 2011-11-20 NOTE — ED Notes (Addendum)
While I was working on discharging patient home, daughter told me that patient breathing seems worse. Reevaluated patient and noted some wheezing lower lobes at first. I raised the head of the bed. Patient started breathing faster. And coughing. She sated she has nasal congestion and dripping on her throat making her cough. While standing next to patient, she started becoming diaphoretic. Connected patient back to monitor, and noted SPO2 of 87% that is lower than half ago which was 94% in room air. O2 at 2l/m applied and O2 sat improved to 93%. Patient's daughter stated that so it happened when she had MI. Dr.Connor notified.

## 2011-11-20 NOTE — Evaluation (Signed)
Physical Therapy Evaluation Patient Details Name: [name] MRN: [id number] DOB: November 28, 1926 Today's Date: 11/20/2011  Problem List:  Patient Active Problem List  Diagnoses  . Paroxysmal atrial fibrillation  . Chest pain  . OSA (obstructive sleep apnea)  . Encounter for long-term (current) use of anticoagulants  . Chronic diastolic heart failure  . Back pain  . NSTEMI (non-ST elevated myocardial infarction)  . Dysuria  . Dizziness  . CKD (chronic kidney disease)  . Cough  . Fatigue  . Acute on chronic diastolic heart failure  . Hypoxia  . Nausea  . Hematuria    Past Medical History:  Past Medical History  Diagnosis Date  . Thrombocytopenia   . Sleep related leg cramps   . Unspecified hearing loss   . HTN (hypertension)   . Lumbar pain   . Osteopenia   . CKD (chronic kidney disease), stage III   . Chronic constipation   . Unspecified urinary incontinence   . Hyponatremia   . Mitral valve disorders   . Disturbance of skin sensation   . Sciatica   . Unspecified cataract   . Lumbosacral spondylosis without myelopathy   . Edema   . HLD (hyperlipidemia)   . Other B-complex deficiencies   . Unspecified vitamin D deficiency   . Colon cancer 1961  . Ovarian cancer 1970  . History of kidney cancer 2005  . Atrial fibrillation     amiodarone; coumadin  . NSTEMI (non-ST elevated myocardial infarction)     in setting of hypoxic resp failure 9/12: ? type 2 NSTEMI vs. true ischemia (med therapy chosen)  . Chronic diastolic heart failure     Echo 8/12: Moderate LVH, EF 55-60%, mild MR, mild BAE, PASP 60. Myoview 8/12: Negative for ischemia, normal LV function.   . DNR (do not resuscitate)   . Morbid obesity    Past Surgical History:  Past Surgical History  Procedure Date  . Appendectomy 1945  . Ankle surgery     Pin placed and later removed  . 2-d echocardiogram 04/30/2011  . Negative myoview stress test 05/07/11    for ischemia or infarction. EF of 62%    PT  Assessment/Plan/Recommendation PT Assessment Clinical Impression Statement: Patient is an 78 y.o emale admitted for acute on chronic diastolic heart failure. Patient presents with generalized muscle weakness, decreased bilat LE power and eccentric control with intermitten right knee buckling, decreased muscular endurance, and decreased high level balance resulting in decreased functional independence and increased risk of fall. PT to follow at frequency of 3x/week during acute hospital stay. Son present for session this morning. Family education re: continued use of RW for mobility with stand by assistance. Also discussed follow-up outpatient PT to address strength and balance deficits to further decrease risk of fall. Patient and son in agreement with this plan. PT Recommendation/Assessment: Patient will need skilled PT in the acute care venue PT Problem List: Decreased strength;Decreased activity tolerance;Decreased balance;Decreased mobility Barriers to Discharge: None PT Therapy Diagnosis : Difficulty walking;Generalized weakness PT Plan PT Frequency: Min 3X/week PT Treatment/Interventions: DME instruction;Gait training;Functional mobility training;Therapeutic activities;Therapeutic exercise;Balance training;Patient/family education PT Recommendation Follow Up Recommendations: Outpatient PT Equipment Recommended: None recommended by PT (owns RW to be used for mobility at d/c) PT Goals  Acute Rehab PT Goals PT Goal Formulation: With patient Time For Goal Achievement: 7 days Pt will go Supine/Side to Sit: with modified independence;with HOB 0 degrees PT Goal: Supine/Side to Sit - Progress: Goal set today Pt will go Sit  to Supine/Side: with modified independence;with HOB 0 degrees PT Goal: Sit to Supine/Side - Progress: Goal set today Pt will go Sit to Stand: with modified independence;with upper extremity assist PT Goal: Sit to Stand - Progress: Goal set today Pt will go Stand to Sit: with  modified independence;with upper extremity assist PT Goal: Stand to Sit - Progress: Goal set today Pt will Transfer Bed to Chair/Chair to Bed: with modified independence PT Transfer Goal: Bed to Chair/Chair to Bed - Progress: Goal set today Pt will Ambulate: 51 - 150 feet;with modified independence PT Goal: Ambulate - Progress: Goal set today  PT Evaluation Precautions/Restrictions  Precautions Precautions: Fall Required Braces or Orthoses: No Restrictions Weight Bearing Restrictions: No Other Position/Activity Restrictions: RW for gait, right knee buckles Prior Functioning  Home Living Lives With: Daughter Receives Help From: Family Type of Home: House Home Layout: One level Home Access: Level entry Bathroom Shower/Tub: Walk-in shower;Door [employer] Toilet: Standard Bathroom Accessibility: Yes How Accessible: Accessible via walker Home Adaptive Equipment: Walker - rolling;Shower chair with back Prior Function Level of Independence: Requires assistive device for independence (RW for all mobility, intermittent falls w/ right knee buckle) Driving: No Vocation: Retired Producer, television/film/video: Awake/alert Overall Cognitive Status: Appears within functional limits for tasks assessed Sensation/Coordination Sensation Light Touch: Appears Intact Stereognosis: Not tested Hot/Cold: Not tested Proprioception: Appears Intact Coordination Gross Motor Movements are Fluid and Coordinated: Yes Fine Motor Movements are Fluid and Coordinated: Yes Extremity Assessment RLE Assessment RLE Assessment: Exceptions to [facility] RLE Strength RLE Overall Strength Comments: hip flexion 2/5, knee extension 3+/5, knee flexion 2/5, andkle DF/PF 3/5 LLE Assessment LLE Assessment: Exceptions to [facility] LLE Strength LLE Overall Strength Comments: hip flexion 2/5, otherwise 4/5. Mobility (including Balance) Bed Mobility Bed Mobility: Yes Supine to Sit: 3: Mod assist;HOB elevated (Comment  degrees);With rails (35 degrees) Sitting - Scoot to Edge of Bed: 4: Min assist Sit to Supine: 3: Mod assist;With rail;HOB elevated (comment degrees) (35 degrees) Sit to Supine - Details (indicate cue type and reason): for bilat LE management Transfers Transfers: Yes Sit to Stand: 3: Mod assist;With upper extremity assist;From bed Sit to Stand Details (indicate cue type and reason): decreased bilat LE power right > left, cues for hand placement and anterior weight shift Stand to Sit: 3: Mod assist;With upper extremity assist;To bed Stand to Sit Details: decreased bilat LE eccentric control requiring physical assist to control descent Ambulation/Gait Ambulation/Gait: Yes Ambulation/Gait Assistance: 4: Min assist Ambulation/Gait Assistance Details (indicate cue type and reason): Cues for upright posture and increased left step length. One instance of knee buckle with right turn with min assist to recover. Patient able to sense when knee is going to buckle and braces for it with UEs on RW. Ambulation Distance (Feet): 75 Feet Assistive device: Rolling walker Gait Pattern: Decreased step length - left;Decreased stance time - right;Lateral hip instability;Trunk flexed Gait velocity: decreased Stairs: No Wheelchair Mobility Wheelchair Mobility: No  Posture/Postural Control Posture/Postural Control: No significant limitations Balance Balance Assessed: Yes Static Sitting Balance Static Sitting - Balance Support: No upper extremity supported;Feet supported Static Sitting - Level of Assistance: 7: Independent Dynamic Sitting Balance Dynamic Sitting - Balance Support: No upper extremity supported;Feet supported;During functional activity Dynamic Sitting - Level of Assistance: 7: Independent Static Standing Balance Static Standing - Balance Support: Right upper extremity supported Static Standing - Level of Assistance: 5: Stand by assistance Dynamic Standing Balance Dynamic Standing - Balance  Support: Bilateral upper extremity supported;During functional activity Dynamic Standing - Level of  Assistance: 4: Min assist High Level Balance High Level Balance Activites: Turns;Direction changes High Level Balance Comments: with RW and min assist   End of Session PT - End of Session Equipment Utilized During Treatment: Gait belt;Other (comment) (RW) Activity Tolerance: Patient tolerated treatment well Patient left: in bed;with call bell in reach;with family/visitor present Nurse Communication: Mobility status for transfers;Mobility status for ambulation General Behavior During Session: [facility] for tasks performed Cognition: [facility] for tasks performed  Romeo Rabon 11/20/2011, 11:01 AM

## 2011-11-20 NOTE — ED Notes (Signed)
Patient appears to be comfortable, she is not coughing at this time. Patient resting comfortably

## 2011-11-20 NOTE — Progress Notes (Signed)
Patient ID: [name], female   DOB: [birth year], 78 y.o.   MRN: [id number]  Subjective: Breathing better.  Mentions her chronic nausea in the morning - better with food.  On oxygen at home.    Objective: Weight change:   Intake/Output Summary (Last 24 hours) at 11/20/11 0730 Last data filed at 11/20/11 0553  Gross per 24 hour  Intake     11 ml  Output   1450 ml  Net  -1439 ml   Blood pressure 176/82, pulse 57, temperature 97.5 F (36.4 C), temperature source Oral, resp. rate 22, height 5\' 5"  (1.651 m), weight 75.2 kg (165 lb 12.6 oz), SpO2 99.00%. Filed Vitals:   11/20/11 0200 11/20/11 0215 11/20/11 0258 11/20/11 0526  BP: 160/69 154/90  176/82  Pulse: 52 53  57  Temp:    97.5 F (36.4 C)  TempSrc:      Resp: 21 24  22   Height:   5\' 5"  (1.651 m)   Weight:   77.111 kg (170 lb) 75.2 kg (165 lb 12.6 oz)  SpO2: 98% 98%  99%    Physical Exam: General: No acute distress Lungs: Clear to auscultation bilaterally without wheezes or crackles Cardiovascular: bradycardic, no m/r/g.   Abdomen: Nontender, nondistended, soft, bowel sounds positive, no rebound, no ascites, no appreciable mass Extremities: No significant cyanosis, clubbing, or edema bilateral lower extremities  Basic Metabolic Panel:  Lab 11/19/11 7829  NA 131*  K 3.8  CL 98  CO2 24  GLUCOSE 94  BUN 17  CREATININE 1.59*  CALCIUM 9.0  MG --  PHOS --   Liver Function Tests:  Lab 11/19/11 2132  AST 29  ALT 12  ALKPHOS 43  BILITOT 0.7  PROT 6.6  ALBUMIN 3.4*    Lab 11/19/11 2132  LIPASE 50  AMYLASE --   CBC:  Lab 11/19/11 2056  WBC 5.4  NEUTROABS 2.8  HGB 13.7  HCT 39.9  MCV 85.3  PLT 84*   Cardiac Enzymes:  Lab 11/20/11 0022  CKTOTAL --  CKMB --  CKMBINDEX --  TROPONINI <0.30   Coagulation:  Lab 11/19/11 2132  LABPROT 26.1*  INR 2.35*    Studies/Results: Scheduled Meds:   . amiodarone  200 mg Oral BID  . aspirin  324 mg Oral Once  . aspirin  81 mg Oral Daily  . furosemide   20 mg Intravenous Q8H  . furosemide  40 mg Intravenous Once  . heparin  5,000 Units Subcutaneous Q8H  . isosorbide mononitrate  60 mg Oral Daily  . ketorolac  15 mg Intravenous Once  . metoprolol succinate  12.5 mg Oral Daily  . ondansetron  4 mg Intravenous Once  . potassium chloride SA  20 mEq Oral Daily  . simvastatin  20 mg Oral q1800  . sodium chloride  500 mL Intravenous Once  . sodium chloride  3 mL Intravenous Q12H  . DISCONTD: alendronate  10 mg Oral QAC breakfast  . DISCONTD: aspirin  81 mg Oral Daily  . DISCONTD: dexamethasone  10 mg Intravenous Once  . DISCONTD: diphenhydrAMINE  12.5 mg Intravenous Once  . DISCONTD: metoCLOPramide (REGLAN) injection  10 mg Intravenous Once   Continuous Infusions:   . DISCONTD: sodium chloride Stopped (11/19/11 2334)   PRN Meds:.sodium chloride, acetaminophen, ondansetron (ZOFRAN) IV, sodium chloride  Anti-infectives:  Anti-infectives    None      Assessment/Plan: Active Problems:  CKD (chronic kidney disease)  Acute on chronic diastolic heart failure  Hypoxia  Nausea  Hematuria   84yoF with h/o AFib/Coumadin, diastolic HF (EF 60%), CKD stage III, NSTEMI 04/2011 presents with  chronic nausea and sweats and found in ER to have acute on chronic dCHF with acute respiratory  distress and hypoxia.   1. Acute on chronic diastolic HF with Hypoxia,  new small bilateral pleural effusions, BNP 8800 (prior 400): -Known to have chronic bradycardia. - Continue IV Lasix boluses 20 mg IV q8 x2 more doses. - Continue home imdur, toprol, amiodarone, ASA 81, statin, Holding home lasix PO  - Arranged for Cardiology follow up 3/15 with Tereso Newcomer, PA-C  2. Nausea, sweats: These are her chief complaints, and the nausea has been a chronic ongoing issue for months, it was just persistent today. There are no documented fevers and w/u so far is  negative, no leukocytosis, and her abdomen is very benign. Overall, given numerous medical changes  in  the past several months and known bradycardia noted at outpt cards office, I suspect this may  be medication related and advised her daughter to talk with outpt providers about this.  Alternatively consider GI consultation as outpt.   - Continue home meds, will not change her regimen acutely. Add PRN Zofran and low dose PO Phenergan.   3. CKD: Cr appears to be within her baseline, not active issue, monitor bmet while on increased dose of lasix.  4. Hematuria: Hbg. 13.7 and stable.  U/A does not appear to show infection. Per last outpt cardiology notes, she has frequent bladder infxns and sees urology, Dr. Harvin Hazel in [place], and has known significant hematuria.  Will ask patient to follow up with her outpatient urologist.     5.  Atrial Fibrillation with chronic bradycardia - on coumadin:  Restart coumadin per pharmacy.  BB at 12.5 bid.  Will leave BB in place as bradycardia is a known issue with her cardiologist.  6.  Hypertension:  Not well controlled inpatient.  On home meds.  Will add PRN hydralazine and monitor.    Noted to be DNR 04/2011, this was discussed and confirmed DNR  Disposition:  Physical & Occupational evaluations pending.    LOS: 1 day   Stephani Police 11/20/2011, 7:30 AM [phone]

## 2011-11-20 NOTE — Progress Notes (Signed)
Utilization review complete 

## 2011-11-20 NOTE — Progress Notes (Signed)
I have personaly seen and examined [name]  I agree with PE/Ap as per Adalberto Ill, PA note Deontre Allsup

## 2011-11-20 NOTE — ED Notes (Signed)
MD at bedside. Dr.Bonno with patient

## 2011-11-21 LAB — BASIC METABOLIC PANEL
CO2: 26 mEq/L (ref 19–32)
Chloride: 97 mEq/L (ref 96–112)
GFR calc non Af Amer: 34 mL/min — ABNORMAL LOW (ref >90)
Glucose, Bld: 84 mg/dL (ref 70–99)
Potassium: 3.8 mEq/L (ref 3.5–5.1)
Sodium: 132 mEq/L — ABNORMAL LOW (ref 135–145)

## 2011-11-21 LAB — CARDIAC PANEL(CRET KIN+CKTOT+MB+TROPI)
CK, MB: 2.3 ng/mL (ref 0.3–4.0)
Relative Index: INVALID (ref 0.0–2.5)
Troponin I: <0.3 ng/mL (ref <0.30)

## 2011-11-21 MED ORDER — AMIODARONE HCL 200 MG PO TABS: 200.0000 mg | ORAL_TABLET | Freq: Every day | ORAL | Status: DC

## 2011-11-21 MED ORDER — MOMETASONE FUROATE 50 MCG/ACT NA SUSP: 2.0000 | Freq: Every day | NASAL | Status: AC

## 2011-11-21 MED ORDER — FUROSEMIDE 20 MG PO TABS: 40.0000 mg | ORAL_TABLET | Freq: Every day | ORAL | Status: DC

## 2011-11-21 NOTE — Progress Notes (Signed)
ANTICOAGULATION CONSULT NOTE - Follow Up Consult  Pharmacy Consult for Coumadin Indication: atrial fibrillation  No Known Allergies  Patient Measurements: Height: 5\' 5"  (165.1 cm) Weight: 165 lb 9.1 oz (75.1 kg) IBW/kg (Calculated) : 57  Heparin Dosing Weight:    Vital Signs: Temp: 97.6 F (36.4 C) (03/09 0516) BP: 157/79 mmHg (03/09 0516) Pulse Rate: 49  (03/09 0516)  Labs:  Basename 11/21/11 0630 11/21/11 0500 11/20/11 0022 11/19/11 2132 11/19/11 2056  HGB -- -- -- -- 13.7  HCT -- -- -- -- 39.9  PLT -- -- -- -- 84*  APTT -- -- -- 49* --  LABPROT -- -- -- 26.1* --  INR -- -- -- 2.35* --  HEPARINUNFRC -- -- -- -- --  CREATININE 1.41* -- -- -- 1.59*  CKTOTAL -- 38 -- -- --  CKMB -- 2.3 -- -- --  TROPONINI -- <0.30 <0.30 -- --   Estimated Creatinine Clearance: 31.2 ml/min (by C-G formula based on Cr of 1.41).   Medications:  Prescriptions prior to admission  Medication Sig Dispense Refill  . acetaminophen (TYLENOL) 500 MG tablet Take 500 mg by mouth 2 (two) times daily as needed. For pain fever      . alendronate (FOSAMAX) 10 MG tablet Take 10 mg by mouth daily before breakfast. Take with a full glass of water on an empty stomach.      Marland Kitchen amiodarone (PACERONE) 200 MG tablet Take 200 mg by mouth 2 (two) times daily.        Marland Kitchen aspirin 81 MG tablet Take 81 mg by mouth daily.        . cetirizine (ZYRTEC) 10 MG tablet Take 10 mg by mouth daily.        Marland Kitchen dextromethorphan (DELSYM) 30 MG/5ML liquid Take 60 mg by mouth as needed. For cough      . fenofibrate micronized (LOFIBRA) 134 MG capsule Take 134 mg by mouth daily before breakfast.        . furosemide (LASIX) 20 MG tablet Take 1 tablet (20 mg total) by mouth daily.  30 tablet  11  . HYDROcodone-acetaminophen (VICODIN) 5-500 MG per tablet Take 1 tablet by mouth every 6 (six) hours as needed. For pain      . HYDROcodone-homatropine (HYCODAN) 5-1.5 MG/5ML syrup Take 5 mLs by mouth every 6 (six) hours as needed. 1/2 to 2 tsp  every 6 hours as needed      . isosorbide mononitrate (IMDUR) 30 MG 24 hr tablet Take 60 mg by mouth daily.      Marland Kitchen lovastatin (MEVACOR) 40 MG tablet Take 40 mg by mouth at bedtime.        . meclizine (ANTIVERT) 25 MG tablet Take 25 mg by mouth 3 (three) times daily as needed. For dizziness      . metoprolol succinate (TOPROL XL) 25 MG 24 hr tablet Take 0.5 tablets (12.5 mg total) by mouth daily.      . nitroGLYCERIN (NITROSTAT) 0.4 MG SL tablet Place 0.4 mg under the tongue every 5 (five) minutes as needed. For chest pain      . pantoprazole (PROTONIX) 40 MG tablet Take 1 tablet (40 mg total) by mouth daily.  30 tablet  6  . potassium chloride SA (K-DUR,KLOR-CON) 20 MEQ tablet Take 20 mEq by mouth daily.      Marland Kitchen warfarin (COUMADIN) 1 MG tablet Take 0.5-1 mg by mouth every evening. Sunday, Monday, Tuesday, Thursday, Friday, Saturday take 1mg ; Wednesday take 0.5mg   Assessment: 78 year old on Coumadin PTA for Afib.  Cards: Admitted with acute on chronic CHF, h/o CAD. BP max 160/82 with low HR 49-56 Meds: Amiodarone, ASA 81mg , Imdur, metoprolol, K, Zocor.  Renal: Stage III CKD. Scr 1.41. Hematuria noted in MD note. Hbg ok, UA ok but has h/o frequent bladder infections.   Goal of Therapy:  INR 2-3   Plan:  INR cancelled. Pt d/c home  Misty Stanley Stillinger 11/21/2011,12:16 PM

## 2011-11-21 NOTE — Progress Notes (Signed)
Pt to be discharged today . IV site removed. Belongings bagged. Discharge instructions reviewed. Shoni Quijas,RN.

## 2011-11-21 NOTE — Discharge Summary (Signed)
Patient ID: [name] MRN: [id number] DOB/AGE: 05/17/1927 78 y.o. Primary Care Physician:HAMRICK,MAURA L, MD, MD Admit date: 11/19/2011 Discharge date: 11/22/2011    Discharge Diagnoses:  Acute on chronic diastolic heart failure  OSA (obstructive sleep apnea)  CKD (chronic kidney disease)  Cough  Hypoxia  Nausea  Hematuria  Chronic sinusitis Hx of PAF   Medication List  As of 11/22/2011  2:41 PM   START taking these medications         mometasone 50 MCG/ACT nasal spray   Commonly known as: NASONEX   Place 2 sprays into the nose daily.      ondansetron 8 MG tablet   Commonly known as: ZOFRAN   Take 1 tablet (8 mg total) by mouth every 8 (eight) hours as needed for nausea.         CHANGE how you take these medications         amiodarone 200 MG tablet   Commonly known as: PACERONE   Take 1 tablet (200 mg total) by mouth daily.   What changed: how often to take the med      furosemide 20 MG tablet   Commonly known as: LASIX   Take 2 tablets (40 mg total) by mouth daily.   What changed: dose         CONTINUE taking these medications         acetaminophen 500 MG tablet   Commonly known as: TYLENOL      alendronate 10 MG tablet   Commonly known as: FOSAMAX      aspirin 81 MG tablet      cetirizine 10 MG tablet   Commonly known as: ZYRTEC      fenofibrate micronized 134 MG capsule   Commonly known as: LOFIBRA      HYDROcodone-acetaminophen 5-500 MG per tablet   Commonly known as: VICODIN      isosorbide mononitrate 30 MG 24 hr tablet   Commonly known as: IMDUR      lovastatin 40 MG tablet   Commonly known as: MEVACOR      nitroGLYCERIN 0.4 MG SL tablet   Commonly known as: NITROSTAT      pantoprazole 40 MG tablet   Commonly known as: PROTONIX   Take 1 tablet (40 mg total) by mouth daily.      potassium chloride SA 20 MEQ tablet   Commonly known as: K-DUR,KLOR-CON      TOPROL XL 25 MG 24 hr tablet   Generic drug: metoprolol succinate     warfarin 1 MG tablet   Commonly known as: COUMADIN         STOP taking these medications         dextromethorphan 30 MG/5ML liquid      HYDROcodone-homatropine 5-1.5 MG/5ML syrup      meclizine 25 MG tablet          Where to get your medications    These are the prescriptions that you need to pick up.   You may get these medications from any pharmacy.         furosemide 20 MG tablet   mometasone 50 MCG/ACT nasal spray   ondansetron 8 MG tablet         Information on where to get these meds is not yet available. Ask your nurse or doctor.         amiodarone 200 MG tablet            Discharged Condition is fair  Consults:  Significant Diagnostic Studies: Dg Chest 2 View  11/19/2011  *RADIOLOGY REPORT*  Clinical Data: Weakness and diaphoresis.  History of myocardial infarction.  CHEST - 2 VIEW  Comparison: 09/14/2011 and 05/16/2011.  Findings: The heart size and mediastinal contours are stable. There are new small bilateral pleural effusions with mild associated basilar atelectasis.  No edema or confluent airspace opacity is seen.  There are no acute osseous findings.  The subacromial space of the left shoulder is mildly narrowed.  IMPRESSION: New small bilateral pleural effusions with associated bibasilar atelectasis.  No overt pulmonary edema identified.  Original Report Authenticated By: Gerrianne Scale, M.D.    Lab Results: Results for orders placed during the hospital encounter of 11/19/11 (from the past 48 hour(s))  CARDIAC PANEL(CRET KIN+CKTOT+MB+TROPI)     Status: Normal   Collection Time   11/21/11  5:00 AM      Component Value Range Comment   Total CK 38  7 - 177 (U/L)    CK, MB 2.3  0.3 - 4.0 (ng/mL)    Troponin I <0.30  <0.30 (ng/mL)    Relative Index RELATIVE INDEX IS INVALID  0.0 - 2.5    BASIC METABOLIC PANEL     Status: Abnormal   Collection Time   11/21/11  6:30 AM      Component Value Range Comment   Sodium 132 (*) 135 - 145 (mEq/L)    Potassium  3.8  3.5 - 5.1 (mEq/L)    Chloride 97  96 - 112 (mEq/L)    CO2 26  19 - 32 (mEq/L)    Glucose, Bld 84  70 - 99 (mg/dL)    BUN 13  6 - 23 (mg/dL)    Creatinine, Ser 1.61 (*) 0.50 - 1.10 (mg/dL)    Calcium 8.4  8.4 - 10.5 (mg/dL)    GFR calc non Af Amer 34 (*) >90 (mL/min)    GFR calc Af Amer 39 (*) >90 (mL/min)   PRO B NATRIURETIC PEPTIDE     Status: Abnormal   Collection Time   11/21/11  6:30 AM      Component Value Range Comment   Pro B Natriuretic peptide (BNP) 12835.0 (*) 0 - 450 (pg/mL)    No results found for this or any previous visit (from the past 240 hour(s)).   Hospital Course:   A 78 year old woman with multiple medical problems including chronic diastolic congestive heart failure. chronic kidney disease, presented to the emergency room with chronic nausea. She received a bolus of IV fluids and promptly went into pulmonary edema with respiratory distress requiring admission to the hospital. After diuresis with Lasix for her status improved. The etiology of the patient's chronic nausea is probably multifactorial and is a combination of bowel edema from congestive heart failure, chronic sinusitis and recurrent urinary tract infections. The patient has significant decompensated CHF as proven by extreme elevated proBNP level. Her status though does not require inpatient hospitalization as she seems to be euvolemic. For better optimization of her CHF care she'll be referred an outpatient to the: Congestive heart failure clinic. For the patient's chronic sinusitis we have initiated treatment with Nasonex, and will continue the Zyrtec as prescribed by Dr. Vassie Loll. Due to the patient's bradycardia and decreased energy we have decreased the dose of amiodarone to 200 mg daily.  Discharge Exam: Blood pressure 150/70, pulse 50, temperature 97.8 F (36.6 C), temperature source Oral, resp. rate 20, height 5\' 5"  (1.651 m), weight 75.1 kg (165 lb  9.1 oz), SpO2 97.00%. Alert oriented x3 Lungs clear  to auscultation without wheeze rhonchi crackles Heart regular without murmurs rubs or gallop Abdomen obese and distended and soft Lower extremity edema  Disposition:  home  Discharge Orders    Future Appointments: Provider: Department: Dept Phone: Center:   11/27/2011 11:10 AM Beatrice Lecher, PA [facility] [phone] [facility]   12/01/2011 10:15 AM [facility] [phone] None   12/03/2011 10:10 AM [facility] [phone] [facility]   01/07/2012 10:45 AM Marinus Maw, MD [facility] [phone] [facility]     Future Orders Please Complete By Expires   Diet - low sodium heart healthy      Increase activity slowly      (HEART FAILURE PATIENTS) Call MD:  Anytime you have any of the following symptoms: 1) 3 pound weight gain in 24 hours or 5 pounds in 1 week 2) shortness of breath, with or without a dry hacking cough 3) swelling in the hands, feet or stomach 4) if you have to sleep on extra pillows at night in order to breathe.         Follow-up Information    Follow up with Tereso Newcomer, PA on 11/27/2011. (Appointment at 11:00 am.)    Contact information:   [address] [place] [address] [phone]       Schedule an appointment as soon as possible for a visit with Arvilla Meres, MD.   Contact information:   [address] [place] [address] [phone]          Signed: Lonia Blood 11/22/2011, 2:41 PM

## 2011-11-22 DIAGNOSIS — J329 Chronic sinusitis, unspecified: Secondary | ICD-10-CM | POA: Diagnosis present

## 2011-11-23 ENCOUNTER — Telehealth: Payer: Self-pay | Admitting: Internal Medicine

## 2011-11-23 NOTE — Telephone Encounter (Signed)
I have reached to the CHF clinic and they will schedule the patient in follow up

## 2011-11-27 ENCOUNTER — Ambulatory Visit (INDEPENDENT_AMBULATORY_CARE_PROVIDER_SITE_OTHER): Payer: Medicare Other | Admitting: Physician Assistant

## 2011-11-27 ENCOUNTER — Ambulatory Visit (INDEPENDENT_AMBULATORY_CARE_PROVIDER_SITE_OTHER): Payer: Medicare Other | Admitting: *Deleted

## 2011-11-27 ENCOUNTER — Encounter: Payer: Self-pay | Admitting: Physician Assistant

## 2011-11-27 VITALS — BP 110/70 | HR 46 | Ht 65.0 in | Wt 161.0 lb

## 2011-11-27 DIAGNOSIS — I4891 Unspecified atrial fibrillation: Secondary | ICD-10-CM

## 2011-11-27 DIAGNOSIS — Z7901 Long term (current) use of anticoagulants: Secondary | ICD-10-CM

## 2011-11-27 DIAGNOSIS — I5032 Chronic diastolic (congestive) heart failure: Secondary | ICD-10-CM

## 2011-11-27 DIAGNOSIS — I509 Heart failure, unspecified: Secondary | ICD-10-CM

## 2011-11-27 DIAGNOSIS — I480 Paroxysmal atrial fibrillation: Secondary | ICD-10-CM

## 2011-11-27 LAB — BASIC METABOLIC PANEL
GFR: 29.9 mL/min — ABNORMAL LOW (ref >60.00)
Glucose, Bld: 73 mg/dL (ref 70–99)
Potassium: 4.5 mEq/L (ref 3.5–5.1)
Sodium: 132 mEq/L — ABNORMAL LOW (ref 135–145)

## 2011-11-27 NOTE — Progress Notes (Signed)
[address] [place], [place]  [address] Phone: [phone] Fax:  [phone]  Date:  11/27/2011   Name:  [name]       DOB:  Dec 10, 1926 MRN:  [id number]  PCP:  Dr. Nathanial Rancher  Primary Electrophysiologist:  Dr. Lewayne Bunting    History of Present Illness: [name] is a 78 y.o. female who returns for post hospital follow up.  She has multiple medical problems including obesity, HTN, CKD, and a history of colon/kidney/ovarian cancer all treated successfully with surgery. She saw Dr. Gala Romney in 8/12 for new onset atrial fibrillation. She was noted to have tachybradycardia syndrome on an event monitor. She was placed on flecainide for rhythm control and coumadin. Echo 8/12: Moderate LVH, EF 55-60%, mild MR, mild BAE, PASP 60. Myoview 8/12: Negative for ischemia, normal LV function.   Amitted 8/30-9/5 with Type 2 NSTEMI in the setting of a/c hypoxic respiratory failure secondary to diastolic CHF in addition to OHS/OSA. She had a wide-complex rhythm in the setting of hypokalemia and flecainide was discontinued. She was placed on amiodarone. She is to be treated medically in regards to NSTEMI (?Type 2 vs true ischemia).    Underwent DCCV 10/24 and restored NSR but then had recurrent AFib several days later.   Saw Dr. Lewayne Bunting for further management and he increased her Amiodarone with plans for a second attempt at DCCV, but spontaneously converted to NSR.  Her symptoms were improved.  He planned to keep her on 400 mg of amiodarone daily for an additional 3 months prior to reducing her dose further.   She was last seen by me 11/04/11.  Her rate was somewhat slow and I cut back on her Toprol.  She was then admitted 3/7-3/10.  She presented with nausea and diaphoresis and was apparently given IV fluids in the emergency room and developed acute on chronic diastolic heart failure.  She was diuresed.  Her amiodarone dose was reduced at discharge due to bradycardia.   She was also referred to the congestive heart failure clinic.  Labs: Sodium 132, potassium 3.8, BUN 13, creatinine 1.41, ALT 12, pro BNP 12,835, cardiac markers negative, hemoglobin 13.7, platelet count 84,000 (chronic).  Chest x-ray demonstrated new small bilateral effusions with associated atelectasis and no overt pulmonary edema.  She is stable.  No significant nausea.  Seems to get "choked" on sinus drainage.  Just started Nasonex.  She sleeps in a recliner due to back pain.  She denies PND.  Has chronic DOE (class 3).  Not that mobile.  No chest pain or syncope.  Not weighing daily.  No significant edema.    Past Medical History  Diagnosis Date  . Thrombocytopenia   . Sleep related leg cramps   . Unspecified hearing loss   . HTN (hypertension)   . Lumbar pain   . Osteopenia   . CKD (chronic kidney disease), stage III   . Chronic constipation   . Unspecified urinary incontinence   . Hyponatremia   . Mitral valve disorders   . Disturbance of skin sensation   . Sciatica   . Unspecified cataract   . Lumbosacral spondylosis without myelopathy   . Edema   . HLD (hyperlipidemia)   . Other B-complex deficiencies   . Unspecified vitamin D deficiency   . Colon cancer 1961  . Ovarian cancer 1970  . History of kidney cancer 2005  . Atrial fibrillation     amiodarone; coumadin  . NSTEMI (non-ST elevated  myocardial infarction)     in setting of hypoxic resp failure 9/12: ? type 2 NSTEMI vs. true ischemia (med therapy chosen)  . Chronic diastolic heart failure     Echo 8/12: Moderate LVH, EF 55-60%, mild MR, mild BAE, PASP 60. Myoview 8/12: Negative for ischemia, normal LV function.   . DNR (do not resuscitate)   . Morbid obesity     Current Outpatient Prescriptions  Medication Sig Dispense Refill  . acetaminophen (TYLENOL) 500 MG tablet Take 500 mg by mouth 2 (two) times daily as needed. For pain fever      . alendronate (FOSAMAX) 10 MG tablet Take 10 mg by mouth daily before  breakfast. Take with a full glass of water on an empty stomach.      Marland Kitchen amiodarone (PACERONE) 200 MG tablet Take 1 tablet (200 mg total) by mouth daily.      Marland Kitchen aspirin 81 MG tablet Take 81 mg by mouth daily.        . cetirizine (ZYRTEC) 10 MG tablet Take 10 mg by mouth daily.        . fenofibrate micronized (LOFIBRA) 134 MG capsule Take 134 mg by mouth daily before breakfast.        . furosemide (LASIX) 20 MG tablet Take 2 tablets (40 mg total) by mouth daily.  30 tablet  0  . HYDROcodone-acetaminophen (VICODIN) 5-500 MG per tablet Take 1 tablet by mouth every 6 (six) hours as needed. For pain      . isosorbide mononitrate (IMDUR) 30 MG 24 hr tablet Take 60 mg by mouth daily.      Marland Kitchen lovastatin (MEVACOR) 40 MG tablet Take 40 mg by mouth at bedtime.        . metoprolol succinate (TOPROL XL) 25 MG 24 hr tablet Take 0.5 tablets (12.5 mg total) by mouth daily.      . mometasone (NASONEX) 50 MCG/ACT nasal spray Place 2 sprays into the nose daily.  17 g  0  . nitroGLYCERIN (NITROSTAT) 0.4 MG SL tablet Place 0.4 mg under the tongue every 5 (five) minutes as needed. For chest pain      . pantoprazole (PROTONIX) 40 MG tablet Take 1 tablet (40 mg total) by mouth daily.  30 tablet  6  . potassium chloride SA (K-DUR,KLOR-CON) 20 MEQ tablet Take 20 mEq by mouth daily.      Marland Kitchen warfarin (COUMADIN) 1 MG tablet Take 0.5-1 mg by mouth every evening. Sunday, Monday, Tuesday, Thursday, Friday, Saturday take 1mg ; Wednesday take 0.5mg       . ondansetron (ZOFRAN) 8 MG tablet Take 1 tablet (8 mg total) by mouth every 8 (eight) hours as needed for nausea.  12 tablet  0    Allergies: No Known Allergies  History  Substance Use Topics  . Smoking status: Never Smoker   . Smokeless tobacco: Current User    Types: Snuff  . Alcohol Use: No     ROS:  Please see the history of present illness.   All other systems reviewed and negative.   PHYSICAL EXAM: VS:  BP 110/70  Ht 5\' 5"  (1.651 m)  Wt 161 lb (73.029 kg)  BMI  26.79 kg/m2 Well nourished, well developed, in no acute distress HEENT: normal Neck: no JVD at 90 degrees Cardiac:  normal S1, S2; RRR; no murmur Lungs:  clear to auscultation bilaterally, no wheezing, rhonchi or rales Abd: soft, nontender, no hepatomegaly Ext: no edema Skin: warm and dry Neuro:  CNs 2-12 intact,  no focal abnormalities noted  EKG:  Sinus brady, HR 46, LAD, IVCD, no change from prior tracing  ASSESSMENT AND PLAN:   1. Atrial fibrillation  Maintaining NSR.  IM decreased amiodarone at d/c.  Now on 200 mg QD.  She has been on amio since 04/2011.  Hopefully, she will remain in NSR.  She is weak and I suspect this is multifactorial, but possibly impacted by bradycardia.  Continue current dose of amiodarone.  Stop Toprol.  Of note, hepatic function and TSH checked recently and these were ok.  Follow up with Dr. Lewayne Bunting as directed.     2. Chronic diastolic heart failure  I agree with notes from the hospital.  Some of her nausea may have been from volume overload and chronic intra-abdominal congestion.  Weights are down.  Symptomatically, she states she "is about the same."  Hard to assess her functional status.  Repeat BMET and BNP today.  Continue current dose of lasix.  She has an appt with Dr. Arvilla Meres in CHF clinic next week.  I have advised her to keep that appt.  She has seen Dr. Gala Romney in the past.       Signed,  Tereso Newcomer, PA-C  12:02 PM 11/27/2011

## 2011-11-27 NOTE — Patient Instructions (Addendum)
Your physician recommends that you schedule a follow-up appointment in: KEEP APPT WITH DR. Gala Romney 12/01/11 ALSO KEEP APPT WITH DR. Ladona Ridgel 01/07/12  HAVE YOUR PT/INR CHECKED TODAY  Your physician recommends that you return for lab work in: BMET, BNP TODAY

## 2011-11-30 ENCOUNTER — Telehealth: Payer: Self-pay | Admitting: *Deleted

## 2011-11-30 DIAGNOSIS — I5033 Acute on chronic diastolic (congestive) heart failure: Secondary | ICD-10-CM

## 2011-11-30 DIAGNOSIS — E785 Hyperlipidemia, unspecified: Secondary | ICD-10-CM

## 2011-11-30 NOTE — Telephone Encounter (Signed)
Message copied by Tarri Fuller on Mon Nov 30, 2011 10:43 AM ------      Message from: [place], [place] T      Created: Sun Nov 29, 2011  8:46 PM       Creatinine stable      Repeat BMET in one week      Tereso Newcomer, [place]  8:46 PM 11/29/2011

## 2011-11-30 NOTE — Telephone Encounter (Signed)
daughter notified of lab results today and asked if when repeat bmet done could they just have the liver profile done the same day and then not have to come in on 12/03/11, seeing Bensimhon on 12/01/11, I told daughter that will be fine. Orders placed today for bmet and liver profile. Danielle Rankin

## 2011-12-01 ENCOUNTER — Ambulatory Visit (HOSPITAL_COMMUNITY)
Admission: RE | Admit: 2011-12-01 | Discharge: 2011-12-01 | Disposition: A | Discharge status: Home / Self Care | Payer: Medicare Other | Source: Ambulatory Visit | Attending: Internal Medicine | Admitting: Internal Medicine

## 2011-12-01 VITALS — BP 128/60 | HR 61 | Wt 161.0 lb

## 2011-12-01 DIAGNOSIS — I5032 Chronic diastolic (congestive) heart failure: Secondary | ICD-10-CM

## 2011-12-01 DIAGNOSIS — I509 Heart failure, unspecified: Secondary | ICD-10-CM

## 2011-12-01 NOTE — Progress Notes (Signed)
Patient ID: [name], female   DOB: 11-21-1926, 78 y.o.   MRN: [id number] HPI: [age] year old with history of obesity, HTN, CKD, and a history of colon/kidney/ovarian cancer all treated successfully with surgery, A-Fib, and diastolic heart failure.   Echo 8/12: Moderate LVH, EF 55-60%, mild MR, mild BAE, PASP 60. Myoview 8/12: Negative for ischemia, normal LV function.   Amitted 8/30-9/5 with Type 2 NSTEMI in the setting of a/c hypoxic respiratory failure secondary to diastolic CHF in addition to OHS/OSA. She had a wide-complex rhythm in the setting of hypokalemia and flecainide was discontinued. She was placed on amiodarone. She is to be treated medically in regards to NSTEMI (?Type 2 vs true ischemia).   Underwent DCCV 10/24 and restored NSR but then had recurrent AFib several days later.  Saw Dr. Lewayne Bunting for further management and he increased her Amiodarone with plans for a second attempt at DCCV, but spontaneously converted to NSR.   Discharged form [facility] 11/21/11 Diastolic Heart Failure  She is here for post-hospitalization. Denies SOB/CP/PND/Orthopnea. Weight 159-161 pounds. Followed by [facility]. April Todd RN is the [facility] Case Manager [phone]. Denies lower extremity edema. Follows low salt diet. Compliant with medications. Lives with her daughter. Essentially chair bound and can only take a few steps with a walker.   ROS: All systems negative except as listed in HPI, PMH and Problem List.  Past Medical History  Diagnosis Date  . Thrombocytopenia   . Sleep related leg cramps   . Unspecified hearing loss   . HTN (hypertension)   . Lumbar pain   . Osteopenia   . CKD (chronic kidney disease), stage III   . Chronic constipation   . Unspecified urinary incontinence   . Hyponatremia   . Mitral valve disorders   . Disturbance of skin sensation   . Sciatica   . Unspecified cataract   . Lumbosacral spondylosis without myelopathy   . Edema   . HLD (hyperlipidemia)   . Other  B-complex deficiencies   . Unspecified vitamin D deficiency   . Colon cancer 1961  . Ovarian cancer 1970  . History of kidney cancer 2005  . Atrial fibrillation     amiodarone; coumadin  . NSTEMI (non-ST elevated myocardial infarction)     in setting of hypoxic resp failure 9/12: ? type 2 NSTEMI vs. true ischemia (med therapy chosen)  . Chronic diastolic heart failure     Echo 8/12: Moderate LVH, EF 55-60%, mild MR, mild BAE, PASP 60. Myoview 8/12: Negative for ischemia, normal LV function.   . DNR (do not resuscitate)   . Morbid obesity     Current Outpatient Prescriptions  Medication Sig Dispense Refill  . acetaminophen (TYLENOL) 500 MG tablet Take 500 mg by mouth 2 (two) times daily as needed. For pain fever      . alendronate (FOSAMAX) 10 MG tablet Take 10 mg by mouth daily before breakfast. Take with a full glass of water on an empty stomach.      Marland Kitchen amiodarone (PACERONE) 200 MG tablet Take 1 tablet (200 mg total) by mouth daily.      Marland Kitchen aspirin 81 MG tablet Take 81 mg by mouth daily.        . cetirizine (ZYRTEC) 10 MG tablet Take 10 mg by mouth daily.        . fenofibrate micronized (LOFIBRA) 134 MG capsule Take 134 mg by mouth daily before breakfast.        . furosemide (LASIX)  20 MG tablet Take 2 tablets (40 mg total) by mouth daily.  30 tablet  0  . HYDROcodone-acetaminophen (VICODIN) 5-500 MG per tablet Take 1 tablet by mouth every 6 (six) hours as needed. For pain      . isosorbide mononitrate (IMDUR) 30 MG 24 hr tablet Take 60 mg by mouth daily.      Marland Kitchen lovastatin (MEVACOR) 40 MG tablet Take 40 mg by mouth at bedtime.        . mometasone (NASONEX) 50 MCG/ACT nasal spray Place 2 sprays into the nose daily.  17 g  0  . nitroGLYCERIN (NITROSTAT) 0.4 MG SL tablet Place 0.4 mg under the tongue every 5 (five) minutes as needed. For chest pain      . pantoprazole (PROTONIX) 40 MG tablet Take 1 tablet (40 mg total) by mouth daily.  30 tablet  6  . potassium chloride SA (K-DUR,KLOR-CON)  20 MEQ tablet Take 20 mEq by mouth daily.      Marland Kitchen warfarin (COUMADIN) 1 MG tablet Take 0.5-1 mg by mouth every evening. Sunday, Monday, Tuesday, Thursday, Friday, Saturday take 1mg ; Wednesday take 0.5mg          PHYSICAL EXAM: Filed Vitals:   12/01/11 1318  BP: 128/60  Pulse: 61   Weight change:  161 (161) General:  Chronically ill appearing. No resp difficulty HEENT: normal Neck: supple. JVP 5-6 Carotids 2+ bilaterally; no bruits. No lymphadenopathy or thryomegaly appreciated. Cor: PMI normal. Regular rate & rhythm. No rubs, gallops or murmurs. Lungs: clear Abdomen: obese, soft, nontender, nondistended. No hepatosplenomegaly. No bruits or masses. Good bowel sounds. Extremities: no cyanosis, clubbing, rash, edema Neuro: alert & orientedx3, cranial nerves grossly intact. Moves all 4 extremities w/o difficulty. Affect pleasant.      ASSESSMENT & PLAN:

## 2011-12-01 NOTE — Assessment & Plan Note (Addendum)
Volume status stable. Continue current diuretics. Spoke with April Todd RN, [facility] Care Manager regarding diuretic regimen. [facility] to collect BMET next week and fax results [phone]. She will follow up in 3 months.    Patient seen and examined with Tonye Becket, NP. We discussed all aspects of the encounter. I agree with the assessment and plan as stated above. Overall doing well. Reinforced need for daily weights and reviewed use of sliding scale diuretics.We also contacted [facility] to help her (and [place]) manage her volume status more closely. Check labs next week.

## 2011-12-01 NOTE — Patient Instructions (Addendum)
Ask [facility] to draw BMET next week   Follow up in 3 months  Do the following things EVERYDAY: 1) Weigh yourself in the morning before breakfast. Write it down and keep it in a log. 2) Take your medicines as prescribed 3) Eat low salt foods--Limit salt (sodium) to 2000mg  per day.  4) Stay as active as you can everyday

## 2011-12-03 ENCOUNTER — Other Ambulatory Visit: Payer: Medicare Other

## 2011-12-07 ENCOUNTER — Other Ambulatory Visit: Payer: Medicare Other

## 2011-12-14 ENCOUNTER — Inpatient Hospital Stay (HOSPITAL_COMMUNITY)
Admission: EM | Admit: 2011-12-14 | Discharge: 2011-12-22 | DRG: 956 | Disposition: A | Discharge status: Skilled Nursing Facility | Payer: Medicare Other | Source: Other Acute Inpatient Hospital | Attending: Family Medicine | Admitting: Family Medicine

## 2011-12-14 ENCOUNTER — Encounter (HOSPITAL_COMMUNITY): Payer: Self-pay | Admitting: Cardiology

## 2011-12-14 DIAGNOSIS — S065XAA Traumatic subdural hemorrhage with loss of consciousness status unknown, initial encounter: Secondary | ICD-10-CM | POA: Diagnosis present

## 2011-12-14 DIAGNOSIS — I2510 Atherosclerotic heart disease of native coronary artery without angina pectoris: Secondary | ICD-10-CM | POA: Diagnosis present

## 2011-12-14 DIAGNOSIS — N183 Chronic kidney disease, stage 3 unspecified: Secondary | ICD-10-CM | POA: Diagnosis present

## 2011-12-14 DIAGNOSIS — E669 Obesity, unspecified: Secondary | ICD-10-CM | POA: Diagnosis present

## 2011-12-14 DIAGNOSIS — Z85528 Personal history of other malignant neoplasm of kidney: Secondary | ICD-10-CM

## 2011-12-14 DIAGNOSIS — I129 Hypertensive chronic kidney disease with stage 1 through stage 4 chronic kidney disease, or unspecified chronic kidney disease: Secondary | ICD-10-CM | POA: Diagnosis present

## 2011-12-14 DIAGNOSIS — E871 Hypo-osmolality and hyponatremia: Secondary | ICD-10-CM | POA: Diagnosis present

## 2011-12-14 DIAGNOSIS — G4733 Obstructive sleep apnea (adult) (pediatric): Secondary | ICD-10-CM

## 2011-12-14 DIAGNOSIS — S066X9A Traumatic subarachnoid hemorrhage with loss of consciousness of unspecified duration, initial encounter: Principal | ICD-10-CM | POA: Diagnosis present

## 2011-12-14 DIAGNOSIS — D62 Acute posthemorrhagic anemia: Secondary | ICD-10-CM | POA: Diagnosis present

## 2011-12-14 DIAGNOSIS — F17200 Nicotine dependence, unspecified, uncomplicated: Secondary | ICD-10-CM | POA: Diagnosis present

## 2011-12-14 DIAGNOSIS — Z7901 Long term (current) use of anticoagulants: Secondary | ICD-10-CM

## 2011-12-14 DIAGNOSIS — D696 Thrombocytopenia, unspecified: Secondary | ICD-10-CM | POA: Diagnosis present

## 2011-12-14 DIAGNOSIS — S72009A Fracture of unspecified part of neck of unspecified femur, initial encounter for closed fracture: Secondary | ICD-10-CM

## 2011-12-14 DIAGNOSIS — W19XXXA Unspecified fall, initial encounter: Secondary | ICD-10-CM | POA: Diagnosis present

## 2011-12-14 DIAGNOSIS — I2789 Other specified pulmonary heart diseases: Secondary | ICD-10-CM | POA: Diagnosis present

## 2011-12-14 DIAGNOSIS — Z8249 Family history of ischemic heart disease and other diseases of the circulatory system: Secondary | ICD-10-CM

## 2011-12-14 DIAGNOSIS — Z8543 Personal history of malignant neoplasm of ovary: Secondary | ICD-10-CM

## 2011-12-14 DIAGNOSIS — M899 Disorder of bone, unspecified: Secondary | ICD-10-CM | POA: Diagnosis present

## 2011-12-14 DIAGNOSIS — M949 Disorder of cartilage, unspecified: Secondary | ICD-10-CM | POA: Diagnosis present

## 2011-12-14 DIAGNOSIS — I5033 Acute on chronic diastolic (congestive) heart failure: Secondary | ICD-10-CM

## 2011-12-14 DIAGNOSIS — T45515A Adverse effect of anticoagulants, initial encounter: Secondary | ICD-10-CM | POA: Diagnosis present

## 2011-12-14 DIAGNOSIS — N179 Acute kidney failure, unspecified: Secondary | ICD-10-CM | POA: Diagnosis not present

## 2011-12-14 DIAGNOSIS — I4891 Unspecified atrial fibrillation: Secondary | ICD-10-CM

## 2011-12-14 DIAGNOSIS — I252 Old myocardial infarction: Secondary | ICD-10-CM

## 2011-12-14 DIAGNOSIS — I480 Paroxysmal atrial fibrillation: Secondary | ICD-10-CM

## 2011-12-14 DIAGNOSIS — M549 Dorsalgia, unspecified: Secondary | ICD-10-CM

## 2011-12-14 DIAGNOSIS — I5032 Chronic diastolic (congestive) heart failure: Secondary | ICD-10-CM | POA: Diagnosis present

## 2011-12-14 DIAGNOSIS — R791 Abnormal coagulation profile: Secondary | ICD-10-CM | POA: Diagnosis present

## 2011-12-14 DIAGNOSIS — D500 Iron deficiency anemia secondary to blood loss (chronic): Secondary | ICD-10-CM | POA: Diagnosis present

## 2011-12-14 DIAGNOSIS — I509 Heart failure, unspecified: Secondary | ICD-10-CM | POA: Diagnosis present

## 2011-12-14 DIAGNOSIS — Z85038 Personal history of other malignant neoplasm of large intestine: Secondary | ICD-10-CM

## 2011-12-14 DIAGNOSIS — K5900 Constipation, unspecified: Secondary | ICD-10-CM | POA: Diagnosis present

## 2011-12-14 DIAGNOSIS — I498 Other specified cardiac arrhythmias: Secondary | ICD-10-CM | POA: Diagnosis present

## 2011-12-14 DIAGNOSIS — Y998 Other external cause status: Secondary | ICD-10-CM

## 2011-12-14 DIAGNOSIS — S065X9A Traumatic subdural hemorrhage with loss of consciousness of unspecified duration, initial encounter: Secondary | ICD-10-CM | POA: Diagnosis present

## 2011-12-14 LAB — BASIC METABOLIC PANEL
CO2: 29 mEq/L (ref 19–32)
Chloride: 100 mEq/L (ref 96–112)
GFR calc Af Amer: 32 mL/min — ABNORMAL LOW (ref >90)
Sodium: 139 mEq/L (ref 135–145)

## 2011-12-14 LAB — PROTIME-INR
INR: 1.35 (ref 0.00–1.49)
Prothrombin Time: 16.9 seconds — ABNORMAL HIGH (ref 11.6–15.2)

## 2011-12-14 LAB — CBC
Platelets: 157 10*3/uL (ref 150–400)
RBC: 2.88 MIL/uL — ABNORMAL LOW (ref 3.87–5.11)
RDW: 14.7 % (ref 11.5–15.5)
WBC: 9.6 10*3/uL (ref 4.0–10.5)

## 2011-12-14 MED ORDER — ISOSORBIDE MONONITRATE ER 60 MG PO TB24
60.0000 mg | ORAL_TABLET | Freq: Every day | ORAL | Status: DC
  Filled 2011-12-14: qty 1

## 2011-12-14 MED ORDER — SIMVASTATIN 20 MG PO TABS
20.0000 mg | ORAL_TABLET | Freq: Every day | ORAL | Status: DC
  Administered 2011-12-15 – 2011-12-21 (×7): 20 mg via ORAL
  Filled 2011-12-14 (×9): qty 1

## 2011-12-14 MED ORDER — DOCUSATE SODIUM 100 MG PO CAPS
100.0000 mg | ORAL_CAPSULE | Freq: Two times a day (BID) | ORAL | Status: DC
  Administered 2011-12-14 – 2011-12-21 (×12): 100 mg via ORAL
  Filled 2011-12-14 (×17): qty 1

## 2011-12-14 MED ORDER — PANTOPRAZOLE SODIUM 40 MG PO TBEC
40.0000 mg | DELAYED_RELEASE_TABLET | Freq: Every day | ORAL | Status: DC
  Administered 2011-12-14 – 2011-12-22 (×8): 40 mg via ORAL
  Filled 2011-12-14 (×7): qty 1

## 2011-12-14 MED ORDER — MORPHINE SULFATE 2 MG/ML IJ SOLN
2.0000 mg | INTRAMUSCULAR | Status: DC | PRN
  Administered 2011-12-14 – 2011-12-22 (×6): 2 mg via INTRAVENOUS
  Filled 2011-12-14 (×6): qty 1

## 2011-12-14 MED ORDER — HYDROCODONE-ACETAMINOPHEN 5-325 MG PO TABS
1.0000 | ORAL_TABLET | ORAL | Status: DC | PRN
  Administered 2011-12-14: 2 via ORAL
  Administered 2011-12-15 (×3): 1 via ORAL
  Administered 2011-12-16: 2 via ORAL
  Administered 2011-12-16 (×2): 1 via ORAL
  Administered 2011-12-16 – 2011-12-17 (×3): 2 via ORAL
  Administered 2011-12-17 (×2): 1 via ORAL
  Administered 2011-12-18: 2 via ORAL
  Filled 2011-12-14 (×3): qty 1
  Filled 2011-12-14 (×3): qty 2
  Filled 2011-12-14: qty 1
  Filled 2011-12-14 (×2): qty 2
  Filled 2011-12-14: qty 1
  Filled 2011-12-14: qty 2
  Filled 2011-12-14 (×2): qty 1

## 2011-12-14 MED ORDER — NITROGLYCERIN 0.4 MG SL SUBL: 0.4000 mg | SUBLINGUAL_TABLET | SUBLINGUAL | Status: DC | PRN

## 2011-12-14 MED ORDER — POTASSIUM CHLORIDE IN NACL 20-0.45 MEQ/L-% IV SOLN
INTRAVENOUS | Status: DC
  Administered 2011-12-14 – 2011-12-15 (×2): via INTRAVENOUS
  Administered 2011-12-17: 75 mL/h via INTRAVENOUS
  Administered 2011-12-17 – 2011-12-18 (×2): via INTRAVENOUS
  Filled 2011-12-14 (×7): qty 1000

## 2011-12-14 MED ORDER — POTASSIUM CHLORIDE CRYS ER 20 MEQ PO TBCR
20.0000 meq | EXTENDED_RELEASE_TABLET | Freq: Every day | ORAL | Status: DC
  Filled 2011-12-14: qty 1

## 2011-12-14 MED ORDER — AMIODARONE HCL 100 MG PO TABS
100.0000 mg | ORAL_TABLET | Freq: Every day | ORAL | Status: DC
  Administered 2011-12-14 – 2011-12-22 (×8): 100 mg via ORAL
  Filled 2011-12-14 (×9): qty 1

## 2011-12-14 MED ORDER — FLUTICASONE PROPIONATE 50 MCG/ACT NA SUSP
1.0000 | Freq: Every day | NASAL | Status: DC
  Administered 2011-12-15 – 2011-12-22 (×7): 1 via NASAL
  Filled 2011-12-14: qty 16

## 2011-12-14 MED ORDER — MORPHINE SULFATE 2 MG/ML IJ SOLN
1.0000 mg | INTRAMUSCULAR | Status: DC | PRN
  Administered 2011-12-17 – 2011-12-18 (×2): 2 mg via INTRAVENOUS
  Filled 2011-12-14 (×2): qty 1

## 2011-12-14 MED ORDER — FUROSEMIDE 40 MG PO TABS
40.0000 mg | ORAL_TABLET | Freq: Every day | ORAL | Status: DC
  Filled 2011-12-14: qty 1

## 2011-12-14 MED ORDER — ONDANSETRON HCL 4 MG/2ML IJ SOLN: 4.0000 mg | Freq: Four times a day (QID) | INTRAMUSCULAR | Status: DC | PRN

## 2011-12-14 MED ORDER — AMIODARONE HCL 200 MG PO TABS
200.0000 mg | ORAL_TABLET | Freq: Every day | ORAL | Status: DC
  Filled 2011-12-14: qty 1

## 2011-12-14 MED ORDER — LORATADINE 10 MG PO TABS
10.0000 mg | ORAL_TABLET | Freq: Every day | ORAL | Status: DC
  Administered 2011-12-15 – 2011-12-22 (×7): 10 mg via ORAL
  Filled 2011-12-14 (×8): qty 1

## 2011-12-14 MED ORDER — ONDANSETRON HCL 4 MG PO TABS
4.0000 mg | ORAL_TABLET | Freq: Four times a day (QID) | ORAL | Status: DC | PRN
  Filled 2011-12-14: qty 1

## 2011-12-14 NOTE — Consult Note (Signed)
Reason for Consult: Traumatic subarachnoid hemorrhage Referring Physician: Almond Lint, M.D.  [name] is an 78 y.o. right-handed female.  HPI: Patient was at home this morning and fell while walking down the hallway with her walker, apparently fallen backwards striking the left occipital region. She was taken to [facility] and evaluated in the emergency room. Workup revealed a left hip fracture, and CT scan of the head was interpreted by the radiologist as showing minimal traumatic subarachnoid hemorrhage. Patient was transferred to the [facility] hospital trauma surgical service for further evaluation and care. Patient is resting comfortably without complaints at this time.  Notably the patient had been on Coumadin for anticoagulation, and it has been reported that initiation of reversal was done at [facility] with the administration of 4 units of fresh frozen plasma.  Past Medical History:  Past Medical History  Diagnosis Date  . Thrombocytopenia   . Sleep related leg cramps   . Unspecified hearing loss   . HTN (hypertension)   . Lumbar pain   . Osteopenia   . CKD (chronic kidney disease), stage III   . Chronic constipation   . Unspecified urinary incontinence   . Hyponatremia   . Mitral valve disorders   . Disturbance of skin sensation   . Sciatica   . Unspecified cataract   . Lumbosacral spondylosis without myelopathy   . Edema   . HLD (hyperlipidemia)   . Other B-complex deficiencies   . Unspecified vitamin D deficiency   . Colon cancer 1961  . Ovarian cancer 1970  . History of kidney cancer 2005  . Atrial fibrillation     amiodarone; coumadin  . NSTEMI (non-ST elevated myocardial infarction)     in setting of hypoxic resp failure 9/12: ? type 2 NSTEMI vs. true ischemia (med therapy chosen)  . Chronic diastolic heart failure     Echo 8/12: Moderate LVH, EF 55-60%, mild MR, mild BAE, PASP 60. Myoview 8/12: Negative for ischemia, normal LV function.   .  DNR (do not resuscitate)   . Morbid obesity     Past Surgical History:  Past Surgical History  Procedure Date  . Appendectomy 1945  . Ankle surgery     Pin placed and later removed  . 2-d echocardiogram 04/30/2011  . Negative myoview stress test 05/07/11    for ischemia or infarction. EF of 62%    Family History:  Family History  Problem Relation Age of Onset  . Heart attack Son     Social History:  reports that she has never smoked. Her smokeless tobacco use includes Snuff. She reports that she does not drink alcohol or use illicit drugs.  Allergies: No Known Allergies  Medications: I have reviewed the patient's current medications.  Review of systems: Notable for those difficulties described in the history of present illness and past medical history, as well as significant hearing loss and left hip pain, but is otherwise unremarkable.  Physical examination: Patient is a well-developed well-nourished elderly female in no acute distress. External examination reveals an abrasion in the left occipital region. Lungs are clear to auscultation, she has symmetrical respiratory excursion. Heart has a regular rate and rhythm, normal S1-S2, no murmurs heard. Abdomen soft, nondistended, bowel sounds are present. Neurologic examination: Mental status shows patient is awake, alert, and oriented to her name, [place], and April, but not to 2013. Speech is fluent she has good comprehension. Cranial nerves show pupils are equal round and reactive to light, occipitally 3  mm in diameter. Extra ocular movements are intact. Facial movement is symmetrical. Hearing is diminished bilaterally (chronic). Palatal movement is symmetrical. Shoulder shrug is symmetrical. Tongue is midline. Motor examination shows 5 over 5 strength, with no drift of the upper extremities. However testing of the left lower extremity is limited due to significant pain on movement of the left lower extremity. Sensation is intact to  pinprick throughout. Reflexes are diminished. Gait and stance are not tested due to the nature of her condition.  Assessment/Plan: Elderly female who fell late this morning and suffered a left hip fracture and possibly a minimal amount of traumatic subarachnoid hemorrhage. I reviewed the CT images and the findings that suggested minimal traumatic subarachnoid hemorrhage could also represent calcifications rather than hemorrhage. In any event the patient is being monitored in the ICU with neurochecks and a repeat CT of the head has been requested for the morning. The greatest concern is that she had been anticoagulated with Coumadin, which if the CT findings represent traumatic subarachnoid hemorrhage, could have increased due to the anticoagulation. For now she is seemingly neurologically stable, and reversal of her anticoagulation has been initiated. The trauma surgical service is requesting repeat laboratories to assess the extent of reversal of the anticoagulation, and we'll proceed with further reversal if indicated.  I had the opportunity to speak with the patient, her daughter, and her niece, and explained my assessment and recommendations, and had an opportunity to answer their questions. I do not anticipate the need for neurosurgical intervention or extended neurosurgical followup. However I do not feel that anticoagulation will be able to be resumed for at least a couple of weeks, and her treating physicians will need to balance the risks of future falls versus the risk of foregoing anticoagulation.  Hewitt Shorts, MD 12/14/2011, 8:54 PM

## 2011-12-14 NOTE — Consult Note (Signed)
Admit date: 12/14/2011 Referring Physician  Dr. Donell Beers Primary Physician  Dr. Burnell Blanks Primary Cardiologist  Dr. Arvilla Meres Reason for Consultation  Preoperative Cardiac clearance for hip surgery  HPI: This is an 78yo WF with a history of HTN, CKD, atrial fibrillation on amiodarone and coumadin, diastolic heart failure and NSTEMI type II in September of 2012 who was at home this morning and fell while walking down the hallway with her walker, apparently fallen backwards striking the left occipital region.She says that she felt weak this am and went to the bathroom and when she returned from the bathroom she fell.  She says that next thing she remembers is her daughter standing beside her.  She is not sure if she passed out but thinks she may have.  She was taken to [facility] and evaluated in the emergency room. Workup revealed a left hip fracture, and CT scan of the head was interpreted by the radiologist as showing minimal traumatic subarachnoid hemorrhage. Patient was transferred to the [facility] hospital trauma surgical service for further evaluation and care. The patient had been on Coumadin for anticoagulation, and it has been reported that initiation of reversal was done at [facility] with the administration of 4 units of fresh frozen plasma. We are now asked to consult for cardiac clearance for hip surgery and management of her atrial fibrillation and chronic diastolic heart failure while in the hospital.  She is very hard of hearing so accurate history is difficult to obtain.        PMH:   Past Medical History  Diagnosis Date  . Thrombocytopenia   . Sleep related leg cramps   . Unspecified hearing loss   . HTN (hypertension)   . Lumbar pain   . Osteopenia   . CKD (chronic kidney disease), stage III   . Chronic constipation   . Unspecified urinary incontinence   . Hyponatremia   . Mitral valve disorders   . Disturbance of skin sensation   . Sciatica   .  Unspecified cataract   . Lumbosacral spondylosis without myelopathy   . Edema   . HLD (hyperlipidemia)   . Other B-complex deficiencies   . Unspecified vitamin D deficiency   . Colon cancer 1961  . Ovarian cancer 1970  . History of kidney cancer 2005  . Atrial fibrillation     amiodarone; coumadin  . NSTEMI (non-ST elevated myocardial infarction)     in setting of hypoxic resp failure 9/12: ? type 2 NSTEMI vs. true ischemia (med therapy chosen)  . Chronic diastolic heart failure     Echo 8/12: Moderate LVH, EF 55-60%, mild MR, mild BAE, PASP 60. Myoview 8/12: Negative for ischemia, normal LV function.   . DNR (do not resuscitate)   . Morbid obesity      PSH:   Past Surgical History  Procedure Date  . Appendectomy 1945  . Ankle surgery     Pin placed and later removed    Allergies:  Review of patient's allergies indicates no known allergies. Prior to Admit Meds:   Prescriptions prior to admission  Medication Sig Dispense Refill  . acetaminophen (TYLENOL) 500 MG tablet Take 500 mg by mouth 2 (two) times daily as needed. For pain fever      . alendronate (FOSAMAX) 10 MG tablet Take 10 mg by mouth daily before breakfast. Take with a full glass of water on an empty stomach.      Marland Kitchen amiodarone (PACERONE) 200 MG tablet Take  1 tablet (200 mg total) by mouth daily.      Marland Kitchen aspirin 81 MG tablet Take 81 mg by mouth daily.        . cetirizine (ZYRTEC) 10 MG tablet Take 10 mg by mouth daily.        . fenofibrate micronized (LOFIBRA) 134 MG capsule Take 134 mg by mouth daily before breakfast.        . furosemide (LASIX) 20 MG tablet Take 2 tablets (40 mg total) by mouth daily.  30 tablet  0  . HYDROcodone-acetaminophen (VICODIN) 5-500 MG per tablet Take 1 tablet by mouth every 6 (six) hours as needed. For pain      . isosorbide mononitrate (IMDUR) 30 MG 24 hr tablet Take 60 mg by mouth daily.      Marland Kitchen lovastatin (MEVACOR) 40 MG tablet Take 40 mg by mouth at bedtime.        . mometasone (NASONEX)  50 MCG/ACT nasal spray Place 2 sprays into the nose daily.  17 g  0  . nitroGLYCERIN (NITROSTAT) 0.4 MG SL tablet Place 0.4 mg under the tongue every 5 (five) minutes as needed. For chest pain      . pantoprazole (PROTONIX) 40 MG tablet Take 1 tablet (40 mg total) by mouth daily.  30 tablet  6  . potassium chloride SA (K-DUR,KLOR-CON) 20 MEQ tablet Take 20 mEq by mouth daily.      Marland Kitchen warfarin (COUMADIN) 1 MG tablet Take 0.5-1 mg by mouth every evening. Sunday, Monday, Tuesday, Thursday, Friday, Saturday take 1mg ; Wednesday take 0.5mg        Fam HX:    Family History  Problem Relation Age of Onset  . Heart attack Son    Social HX:    History   Social History  . Marital Status: Widowed    Spouse Name: N/A    Number of Children: N/A  . Years of Education: N/A   Occupational History  . Not on file.   Social History Main Topics  . Smoking status: Never Smoker   . Smokeless tobacco: Current User    Types: Snuff  . Alcohol Use: No  . Drug Use: No  . Sexually Active: Not on file   Other Topics Concern  . Not on file   Social History Narrative   Lives at home with daughter close by. [name] [phone].      ROS:  All 11 ROS were addressed and are negative except what is stated in the HPI  Physical Exam: There were no vitals taken for this visit.    General: Well developed, well nourished, in no acute distress Head: Eyes PERRLA, No xanthomas.   Normal cephalic and atramatic  Lungs:   Clear bilaterally to auscultation and percussion. Heart:   HRRR S1 S2 Pulses are 2+ & equal.            No carotid bruit. No JVD.  No abdominal bruits. No femoral bruits. Abdomen: Bowel sounds are positive, abdomen soft and non-tender without masses  Extremities:   No clubbing, cyanosis or edema.  DP +1 Neuro: Alert and oriented X 3. Psych:  Good affect, responds appropriately    Labs:   Lab Results  Component Value Date   WBC 5.4 11/19/2011   HGB 13.7 11/19/2011   HCT 39.9  11/19/2011   MCV 85.3 11/19/2011   PLT 84* 11/19/2011    Lab Results  Component Value Date   INR 2.7 11/27/2011  INR 2.35* 11/19/2011   INR 2.9 11/03/2011   Lab Results  Component Value Date   CKTOTAL 38 11/21/2011   CKMB 2.3 11/21/2011   TROPONINI <0.30 11/21/2011         Radiology:  No results found.  EKG:  Sinus bradycardia with LAD, nonspecific IVCD, LAFB.    ASSESSMENT:  1.  Hip fracture per Trauma service 2.  Atrial fibrillation on amiodarone with intermittent bradycardia in the 50's 3.  Systemic anticoagulation - reversed with FFP 4.  Chronic diastolic heart failure- compensated 5.  Moderate pulmonary hypertension PASP by echo 05/2011 5.  Fall with weakness and ? Syncope 6.  NSTEMI type II 05/2011 in setting of hypoxic respiratory failure on Imdur 7.  Hypotension with SBP in 70's  PLAN:   1.  Decrease Amiodarone to 100mg  daily 2.  Hold Lasix and Imdur due to low BP 3.  Further treatment by Dr. Jannifer Franklin, MD  12/14/2011  9:17 PM

## 2011-12-14 NOTE — H&P (Addendum)
[name] is an 78 y.o. female.   Chief Complaint: Fall with SAH and L hip fx HPI:  Patient is an 78 year old female known to the hospitalist service who had a ground-level fall today. She took a shower and on her way back to her chair she fell. She struck her head and her left hip. She sustained a laceration to her head. She lost consciousness either before or after the fall as she does not recall the fall. Her daughter heard her immediately and pressed her life alert necklace. The daughter stated that she was slightly confused initially but has been progressively clearing as the day has gone on. She is admitted to the hospital 3 weeks ago for congestive heart failure. Her medications were adjusted. She has had a few recent falls, but has not broken anything at that point. At this point her main complaint is severe left hip pain. Her headache is abating at this time. She denies loss of control of her bowel or bladder function at the time of fall. She denies nausea and vomiting. She is able to feel her leg. She is unable to move the right leg significantly as it causes the left hip to hurt.  Past Medical History  Diagnosis Date  . Thrombocytopenia   . Sleep related leg cramps   . Unspecified hearing loss   . HTN (hypertension)   . Lumbar pain   . Osteopenia   . CKD (chronic kidney disease), stage III   . Chronic constipation   . Unspecified urinary incontinence   . Hyponatremia   . Mitral valve disorders   . Disturbance of skin sensation   . Sciatica   . Unspecified cataract   . Lumbosacral spondylosis without myelopathy   . Edema   . HLD (hyperlipidemia)   . Other B-complex deficiencies   . Unspecified vitamin D deficiency   . Colon cancer 1961  . Ovarian cancer 1970  . History of kidney cancer 2005  . Atrial fibrillation     amiodarone; coumadin  . NSTEMI (non-ST elevated myocardial infarction)     in setting of hypoxic resp failure 9/12: ? type 2 NSTEMI vs. true ischemia (med  therapy chosen)  . Chronic diastolic heart failure     Echo 8/12: Moderate LVH, EF 55-60%, mild MR, mild BAE, PASP 60. Myoview 8/12: Negative for ischemia, normal LV function.   . DNR (do not resuscitate)   . Morbid obesity     Past Surgical History  Procedure Date  . Appendectomy 1945  . Ankle surgery     Pin placed and later removed  . 2-d echocardiogram 04/30/2011  . Negative myoview stress test 05/07/11    for ischemia or infarction. EF of 62%    Family History  Problem Relation Age of Onset  . Heart attack Son    Social History:  reports that she has never smoked. Her smokeless tobacco use includes Snuff. She reports that she does not drink alcohol or use illicit drugs.  Allergies: No Known Allergies  No current facility-administered medications on file as of 12/14/2011.   Medications Prior to Admission  Medication Sig Dispense Refill  . acetaminophen (TYLENOL) 500 MG tablet Take 500 mg by mouth 2 (two) times daily as needed. For pain fever      . alendronate (FOSAMAX) 10 MG tablet Take 10 mg by mouth daily before breakfast. Take with a full glass of water on an empty stomach.      Marland Kitchen amiodarone (PACERONE) 200 MG  tablet Take 1 tablet (200 mg total) by mouth daily.      Marland Kitchen aspirin 81 MG tablet Take 81 mg by mouth daily.        . cetirizine (ZYRTEC) 10 MG tablet Take 10 mg by mouth daily.        . fenofibrate micronized (LOFIBRA) 134 MG capsule Take 134 mg by mouth daily before breakfast.        . furosemide (LASIX) 20 MG tablet Take 2 tablets (40 mg total) by mouth daily.  30 tablet  0  . HYDROcodone-acetaminophen (VICODIN) 5-500 MG per tablet Take 1 tablet by mouth every 6 (six) hours as needed. For pain      . isosorbide mononitrate (IMDUR) 30 MG 24 hr tablet Take 60 mg by mouth daily.      Marland Kitchen lovastatin (MEVACOR) 40 MG tablet Take 40 mg by mouth at bedtime.        . mometasone (NASONEX) 50 MCG/ACT nasal spray Place 2 sprays into the nose daily.  17 g  0  . nitroGLYCERIN  (NITROSTAT) 0.4 MG SL tablet Place 0.4 mg under the tongue every 5 (five) minutes as needed. For chest pain      . pantoprazole (PROTONIX) 40 MG tablet Take 1 tablet (40 mg total) by mouth daily.  30 tablet  6  . potassium chloride SA (K-DUR,KLOR-CON) 20 MEQ tablet Take 20 mEq by mouth daily.      Marland Kitchen warfarin (COUMADIN) 1 MG tablet Take 0.5-1 mg by mouth every evening. Sunday, Monday, Tuesday, Thursday, Friday, Saturday take 1mg ; Wednesday take 0.5mg        INR 2.4 at [place],  Cr 1.5 CBC OK CXR without acute trauma and no pulmonary edema seen Pelvis with L subcapital femoral neck fx. CT head with tr L SAH.  No results found for this or any previous visit (from the past 48 hour(s)). No results found.  Review of Systems  HENT: Positive for hearing loss (chronic, severe).   Eyes: Negative.   Respiratory: Negative.   Cardiovascular: Negative.   Gastrointestinal: Negative.   Genitourinary: Negative.   Musculoskeletal: Positive for joint pain (severe L hip pain).  Skin: Negative.   Neurological: Positive for loss of consciousness (Unclear if + LOC before or after fall, but does not recall falling), weakness and headaches (since fall).  Endo/Heme/Allergies: Bruises/bleeds easily (on coumadin).  Psychiatric/Behavioral: Negative.     There were no vitals taken for this visit. Physical Exam  Constitutional: She is oriented to person, place, and time. She appears well-developed and well-nourished. No distress.  HENT:  Head: Normocephalic.  Right Ear: External ear normal.  Left Ear: External ear normal.       Posterior left scalp lac   Eyes: Conjunctivae are normal. Pupils are equal, round, and reactive to light. No scleral icterus.  Neck: Normal range of motion. Neck supple. No tracheal deviation present. No thyromegaly present.  Cardiovascular: Normal rate, regular rhythm, normal heart sounds and intact distal pulses.  Exam reveals no gallop and no friction rub.   No murmur  heard. Respiratory: Effort normal and breath sounds normal. No respiratory distress. She has no wheezes. She has no rales. She exhibits no tenderness.  GI: Soft. Bowel sounds are normal. She exhibits no distension and no mass. There is no tenderness. There is no rebound and no guarding.  Musculoskeletal: She exhibits tenderness (tenderness and swelling at left hip). She exhibits no edema.  Lymphadenopathy:    She has no cervical adenopathy.  Neurological:  She is alert and oriented to person, place, and time. A cranial nerve deficit (CN VIII deficit) is present.  Skin: Skin is warm and dry. No rash noted. She is not diaphoretic. No erythema. There is pallor.  Psychiatric: She has a normal mood and affect. Her behavior is normal. Judgment and thought content normal.     Assessment/Plan L SAH - Anticoagulation reversed with 4 u FFP at [facility].  Recheck Coags  Consider recheck CT in AM if Dr. Jule Ser thinks indicated.  L hip fracture - Have discussed with Dr. Lajoyce Corners  Needs cardiac clearance for OR.  NPO after midnight for now.  Unsure of timing of OR at this point.  Anticoagulation   Recheck labs, maintain off anticoagulation  CHF/CAD  Consult [facility] for cardiac clearance  Multiple medical problems including dizziness/nausea/CKD/Fatigue/Afib  [facility] to manage vs transfer to their service   IVFluids for CKD. Foley for urine monitoring given mobility.    Kire Ferg 12/14/2011, 7:54 PM

## 2011-12-14 NOTE — Consult Note (Signed)
PCP:   Ailene Ravel, MD, MD   Reason for consultation:  Assistance with various medical issues  HPI: 85yoF with h/o AFib/Coumadin, diastolic HF (EF 60%), CKD stage III, NSTEMI 04/2011 medically  treated, h/o colon/kidney/ovarian cancer s/p surgery presents as a transfer from [facility] with left hip fracture, left SAH.   Pt was admitted by this writer on 3/8 after she presented with acute on chronic nausea (pt  has had persistent nausea since having had an MI 05/2011) and found in the ED to have  respiratory distress, hypoxia, BNP 8800 up from 400, small bilateral pleural effusions,  possibly due to having been given IVF's in the ED. She was diuresed with Lasix and this  improved. Nausea thought due to medication regimen/changes vs bowel edema from CHF vs  recurrent UTI's. Her amiodarone was decreased to 200 mg daily due to bradycardia and  decreased energy. She was seen in cardiology f/u clinic and seen in follow up in CHF clinic  with Dr. Gala Romney. Amiodarone was continued at 200 daily and Toprol stopped. She was  apparently at her symptomatic baseline at that point, which is appears to be "essentially  chair bound, can only take a few steps with a walker."   Pt unable to give full history due to hearing difficulties and family has now left. She  apparently was walking back from the bathroom earlier today and had a fall. Difficult to  ascertain if she had LOC but per ED notes she was awake when her daughter found her. She  was taken to [facility] ER where pt found to have left subarachnoid hemorrhage. Dr. Jule Ser  in NSurg was spoken to, reviewed films, did not feel surgical intervention warranted at  this time. Pt also found to have left hip fracture, and Dr. Donell Beers in Trauma accepted pt  for transfer to [facility]. Pt was given 4u FFP at [place].   [facility] is asked to provide consultation given her various medical issues. ROS otherwise not obtainable at this time.    Past  Medical History  Diagnosis Date  . Thrombocytopenia   . Sleep related leg cramps   . Unspecified hearing loss   . HTN (hypertension)   . Lumbar pain   . Osteopenia   . CKD (chronic kidney disease), stage III   . Chronic constipation   . Unspecified urinary incontinence   . Hyponatremia   . Mitral valve disorders   . Disturbance of skin sensation   . Sciatica   . Unspecified cataract   . Lumbosacral spondylosis without myelopathy   . Edema   . HLD (hyperlipidemia)   . Other B-complex deficiencies   . Unspecified vitamin D deficiency   . Colon cancer 1961  . Ovarian cancer 1970  . History of kidney cancer 2005  . Atrial fibrillation     amiodarone; coumadin  . NSTEMI (non-ST elevated myocardial infarction)     in setting of hypoxic resp failure 9/12: ? type 2 NSTEMI vs. true ischemia (med therapy chosen)  . Chronic diastolic heart failure     Echo 8/12: Moderate LVH, EF 55-60%, mild MR, mild BAE, PASP 60. Myoview 8/12: Negative for ischemia, normal LV function.   . DNR (do not resuscitate)   . Morbid obesity     Past Surgical History  Procedure Date  . Appendectomy 1945  . Ankle surgery     Pin placed and later removed  . 2-d echocardiogram 04/30/2011  . Negative myoview stress test 05/07/11  for ischemia or infarction. EF of 62%    Medications:  HOME MEDS: Prior to Admission medications   Medication Sig Start Date End Date Taking? Authorizing Provider  acetaminophen (TYLENOL) 500 MG tablet Take 500 mg by mouth 2 (two) times daily as needed. For pain fever    Historical Provider, MD  alendronate (FOSAMAX) 10 MG tablet Take 10 mg by mouth daily before breakfast. Take with a full glass of water on an empty stomach.    Historical Provider, MD  amiodarone (PACERONE) 200 MG tablet Take 1 tablet (200 mg total) by mouth daily. 11/21/11   Sorin Luanne Bras, MD  aspirin 81 MG tablet Take 81 mg by mouth daily.      Historical Provider, MD  cetirizine (ZYRTEC) 10 MG tablet Take 10 mg  by mouth daily.      Historical Provider, MD  fenofibrate micronized (LOFIBRA) 134 MG capsule Take 134 mg by mouth daily before breakfast.      Historical Provider, MD  furosemide (LASIX) 20 MG tablet Take 2 tablets (40 mg total) by mouth daily. 11/21/11   Sorin Luanne Bras, MD  HYDROcodone-acetaminophen (VICODIN) 5-500 MG per tablet Take 1 tablet by mouth every 6 (six) hours as needed. For pain    Historical Provider, MD  isosorbide mononitrate (IMDUR) 30 MG 24 hr tablet Take 60 mg by mouth daily.    Historical Provider, MD  lovastatin (MEVACOR) 40 MG tablet Take 40 mg by mouth at bedtime.      Historical Provider, MD  mometasone (NASONEX) 50 MCG/ACT nasal spray Place 2 sprays into the nose daily. 11/21/11 11/20/12  Sorin Luanne Bras, MD  nitroGLYCERIN (NITROSTAT) 0.4 MG SL tablet Place 0.4 mg under the tongue every 5 (five) minutes as needed. For chest pain    Historical Provider, MD  pantoprazole (PROTONIX) 40 MG tablet Take 1 tablet (40 mg total) by mouth daily. 04/22/11 04/21/12  Bevelyn Buckles Bensimhon, MD  potassium chloride SA (K-DUR,KLOR-CON) 20 MEQ tablet Take 20 mEq by mouth daily.    Historical Provider, MD  warfarin (COUMADIN) 1 MG tablet Take 0.5-1 mg by mouth every evening. Sunday, Monday, Tuesday, Thursday, Friday, Saturday take 1mg ; Wednesday take 0.5mg  08/31/11 08/30/12  Dolores Patty, MD    Allergies:  No Known Allergies  Social History:   reports that she has never smoked. Her smokeless tobacco use includes Snuff. She reports that she does not drink alcohol or use illicit drugs.  Family History: Family History  Problem Relation Age of Onset  . Heart attack Son     Physical Exam: 110/67  p67  100% [place]  RR15 Gen: Elderly but still quite obese F in hospital bed, not really able to give history due  to hard of hearing. No distress, appears overall well at present.  HEENT: Pupils round and reactive, normal appearing, EOMI sclera clear. No apparent facial  trauma Lungs: CTAB anterolaterally,  good air movement Heart: Regular, not tachycardic, no apparent m/g appreciated Abd: Soft, non tender, non distended, no facial grimacing to palpation Extrem: Warm, perfusing well, no BLE edema noted, radials palpable Neuro: Alert, makes eye contact, but hard to have full conversation due to hearing. CN 2-12  intact otherwise. She is noted to move her BUE's on her own, but I deferred LE testing.    Labs & Imaging Results for orders placed during the hospital encounter of 12/14/11 (from the past 48 hour(s))  MRSA PCR SCREENING     Status: Normal   Collection Time  12/14/11  7:26 PM      Component Value Range Comment   MRSA by PCR NEGATIVE  NEGATIVE     No results found.  L[facility] at [facility]: WBC 6.2, Hct 41.4, plts 77 INR 2.5   139   99   23 ---------------< 117 4.0   25   1.5  Trop 0.04 Myoglobin 116 (0-101) AST 62 ALT 38 AslkP 44 Tbili 1.1  EPIC: 04/2011 Study Conclusions - Left ventricle: The cavity size was normal. Wall thickness was   increased in a pattern of moderate LVH. Systolic function was   normal. The estimated ejection fraction was in the range of 55% to   60%. Wall motion was normal; there were no regional wall motion   abnormalities. - Mitral valve: Mild regurgitation. - Left atrium: The atrium was mildly dilated. - Right atrium: The atrium was mildly dilated. - Pulmonary arteries: PA peak pressure: 60mm Hg (S).   04/2011 myoview  Overall Impression:  Normal stress nuclear study.  No evidence of ischemia.  Normal LV  function.  ECG: 4/1: NSR 57 bpm, LAD with LAFB/IVCD with wide QRS 152, no frank ST segment deviations,  normal TW except inversion in aVL. Small R complexes right precordials, but this is  unchanged. Essentially unchanged compared to prior this month.    Impression 85yoF with h/o AFib/Coumadin, diastolic HF (EF 60%), CKD stage III, NSTEMI 04/2011 medically  treated, h/o colon/kidney/ovarian cancer s/p surgery presents as a transfer from  [facility] with left hip fracture, left SAH.   1. Left SAH: Recommend holding coumadin overnight, and touching base with Nsurg/Dr.  Newell Coral in the am re: further management, need for serial head CT.   2. Left hip fracture: Per Trauma/ortho recommendations. Per discussion with surgery,  [facility] has been consulted as well for clearance.   By the 2007 ACC/AHA algorithm, the patient may have an active cardiac condition that would  delay surgery -- symptomatic bradycardia, for which amiodarone had been decreased to 200  and toprol stopped just this month, thinking that this was the etiology. However, she is  currently running sinus 60-70's. Therefore, I would ask cardiology consultation about  whether her amiodarone should be stopped perioperatively.   Otherwise, she has known evidence of active unstable coronary syndromes, NYHA IV or  new/worsening decompensated HF symptoms, or severe valvular disease that would preclude  surgery.   Finally, per the Revised Goldman Cardiac Risk Index (RCRI), the patient as two potential  independent predictors of perioperative cardiac complications: history of ischemic heart  disease (NSTEMI 05/2011) and history of HF. Therefore, the perioperative rate of cardiac  death, nonfatal myocardial infarction, and nonfatal cardiac arrest is approximately 2.4  percent (95% CI 1.3-3.5 percent).  Nedra Hai, TH, Marcantonio, ER, Mangione, [employer], et al,  Circulation 1999; 100:1043)  - Gentle with IVF's and agree with continuing PO lasix at present. Amiodarone has currently  been continued.   3. CKD stage III: Cr is 1.5 which is well within her baseline. Trending.   4. AFib, on Coumadin: Currently in sinus. INR 2.5, now s/p 4u FFP, and holding coumadin.    5. Thrombocytopenia: Plts are 77 from [place], and she appears to have chronic lowish  plts, as low as 80's before. Unclear etiology. This mild thrombocytopenia should not  preclude surgery, but could  be further worked up as an outpatient once more acute issues  are settled.   Other plans as per orders.  Malisha Mabey 12/14/2011, 9:23 PM

## 2011-12-15 ENCOUNTER — Inpatient Hospital Stay (HOSPITAL_COMMUNITY): Payer: Medicare Other

## 2011-12-15 ENCOUNTER — Encounter (HOSPITAL_COMMUNITY): Payer: Self-pay | Admitting: *Deleted

## 2011-12-15 ENCOUNTER — Other Ambulatory Visit (HOSPITAL_COMMUNITY): Payer: Self-pay | Admitting: Orthopedic Surgery

## 2011-12-15 DIAGNOSIS — I4891 Unspecified atrial fibrillation: Secondary | ICD-10-CM

## 2011-12-15 LAB — CBC
HCT: 20.9 % — ABNORMAL LOW (ref 36.0–46.0)
Hemoglobin: 7.9 g/dL — ABNORMAL LOW (ref 12.0–15.0)
MCH: 29.8 pg (ref 26.0–34.0)
MCHC: 33.2 g/dL (ref 30.0–36.0)
MCV: 88.9 fL (ref 78.0–100.0)
Platelets: 155 10*3/uL (ref 150–400)
Platelets: 168 10*3/uL (ref 150–400)
RBC: 2.69 MIL/uL — ABNORMAL LOW (ref 3.87–5.11)
RBC: 2.35 MIL/uL — ABNORMAL LOW (ref 3.87–5.11)
WBC: 9.7 10*3/uL (ref 4.0–10.5)

## 2011-12-15 LAB — PREPARE RBC (CROSSMATCH)

## 2011-12-15 LAB — COMPREHENSIVE METABOLIC PANEL
AST: 39 U/L — ABNORMAL HIGH (ref 0–37)
Albumin: 3.3 g/dL — ABNORMAL LOW (ref 3.5–5.2)
Chloride: 100 mEq/L (ref 96–112)
Creatinine, Ser: 1.93 mg/dL — ABNORMAL HIGH (ref 0.50–1.10)
Potassium: 4.1 mEq/L (ref 3.5–5.1)
Total Bilirubin: 0.7 mg/dL (ref 0.3–1.2)
Total Protein: 5.7 g/dL — ABNORMAL LOW (ref 6.0–8.3)

## 2011-12-15 LAB — PROTIME-INR
INR: 1.42 (ref 0.00–1.49)
Prothrombin Time: 17.6 seconds — ABNORMAL HIGH (ref 11.6–15.2)

## 2011-12-15 MED ORDER — VITAMIN K1 10 MG/ML IJ SOLN
10.0000 mg | Freq: Once | INTRAVENOUS | Status: AC
  Administered 2011-12-15: 10 mg via INTRAVENOUS
  Filled 2011-12-15 (×2): qty 1

## 2011-12-15 MED ORDER — VITAMIN K1 10 MG/ML IJ SOLN
10.0000 mg | Freq: Once | INTRAVENOUS | Status: AC
  Administered 2011-12-15: 10 mg via INTRAVENOUS
  Filled 2011-12-15: qty 1

## 2011-12-15 NOTE — Progress Notes (Signed)
Subjective: Pt c/o hip/pelvic pain.  She is having some minimal HA. She reports that she does want CPR/Cardioversion/intubation should she have some event, but does not want to be kept on life support if she needed it "longer term"  Objective: Vital signs in last 24 hours: Temp:  [98 F (36.7 C)-98.3 F (36.8 C)] 98 F (36.7 C) (04/02 0800) Pulse Rate:  [61-74] 62  (04/02 0700) Resp:  [11-20] 13  (04/02 0700) BP: (60-127)/(14-67) 95/39 mmHg (04/02 0700) SpO2:  [93 %-100 %] 100 % (04/02 0700) Weight:  [73.2 kg (161 lb 6 oz)] 73.2 kg (161 lb 6 oz) (04/01 2200)    Intake/Output from previous day: 04/01 0701 - 04/02 0700 In: 605 [I.V.:605] Out: 325 [Urine:325] Intake/Output this shift:    General appearance: alert, cooperative, appears stated age and mild distress Resp: diminished breath sounds bibasilar Cardio:RRR GI: soft, non-tender; bowel sounds normal; no masses,  no organomegaly Extremities: NV intact distally  Lab Results:   Basename 12/15/11 0342 12/14/11 2101  WBC 9.2 9.6  HGB 7.9* 8.4*  HCT 23.8* 25.1*  PLT 155 157   BMET  Basename 12/15/11 0342 12/14/11 2101  NA 137 139  K 4.1 3.8  CL 100 100  CO2 28 29  GLUCOSE 121* 111*  BUN 25* 23  CREATININE 1.93* 1.67*  CALCIUM 8.7 9.0   PT/INR  Basename 12/15/11 0342 12/14/11 2101  LABPROT 17.6* 16.9*  INR 1.42 1.35   ABG No results found for this basename: PHART:2,PCO2:2,PO2:2,HCO3:2 in the last 72 hours  Studies/Results: Ct Head Wo Contrast  12/15/2011  *RADIOLOGY REPORT*  Clinical Data: Followup subarachnoid hemorrhage.  CT HEAD WITHOUT CONTRAST  Technique:  Contiguous axial images were obtained from the base of the skull through the vertex without contrast.  Comparison: 12/14/2011.  Findings: Slight increased amount of subarachnoid blood within the left hemisphere.  Interval development of left-sided subdural hematoma, broad-based and measuring up to 6.6 mm in maximal thickness.  Mild local mass  effect.  Small vessel disease type changes. No CT evidence of large acute infarct. No intracranial mass lesion detected on this unenhanced exam.  Opacification right mastoid air cells.  Screw like metallic structure posterior to the left mastoid air cells unchanged.  IMPRESSION: Slight increased amount of subarachnoid blood within the left hemisphere.  Interval development of left-sided subdural hematoma, broad-based and measuring up to 6.6 mm in maximal thickness.  Mild local mass effect.  Critical Value/emergent results were called by telephone at the time of interpretation on 12/15/2011  at 6:55 a.m.  to  [facility] patients nurse., who verbally acknowledged these results.  Original Report Authenticated By: Fuller Canada, M.D.    Anti-infectives: Anti-infectives    None      Assessment/Plan: s/p * No surgery found * Fall from ground level TBI with SAH, SDH while on warfarin- Head CT now with small SDH without significant shift, and slightly increased SAH, More Vitamin K given per Dr. Newell Coral ( S/P 4 units FFP at outside hospital)  Will re ck INR later today, may need more FFP if trending upward L femoral neck fx- Dr. Lajoyce Corners plans hemiarthroplasty on Friday with transfusion pre op FEN- will advance po for now, tolerating clears VTE- off warfarin, SCD's CV status- cardiology following and will defer resumption of meds to cards, she has been mildly to moderately hypotensive as noted Acute on chronic anemia CKD Multiple chronic medical issues- management per IM DISPO- Discussed code status with pt and daughter and pt does  want to be coded at this point, but reports she would not want prolonged support.    LOS: 1 day    [facility] Pager [phone] General Trauma Pager [phone]

## 2011-12-15 NOTE — Consult Note (Signed)
Reason for Consult:left femoral neck fracture Referring Physician: Trauma MD  [name] is an 78 y.o. female.  HPI: fall with possible bleed into head, with femoral neck fx  Past Medical History  Diagnosis Date  . Thrombocytopenia   . Sleep related leg cramps   . Unspecified hearing loss   . HTN (hypertension)   . Lumbar pain   . Osteopenia   . CKD (chronic kidney disease), stage III   . Chronic constipation   . Unspecified urinary incontinence   . Hyponatremia   . Mitral valve disorders   . Disturbance of skin sensation   . Sciatica   . Unspecified cataract   . Lumbosacral spondylosis without myelopathy   . Edema   . HLD (hyperlipidemia)   . Other B-complex deficiencies   . Unspecified vitamin D deficiency   . Colon cancer 1961  . Ovarian cancer 1970  . History of kidney cancer 2005  . Atrial fibrillation     amiodarone; coumadin  . NSTEMI (non-ST elevated myocardial infarction)     in setting of hypoxic resp failure 9/12: ? type 2 NSTEMI vs. true ischemia (med therapy chosen)  . Chronic diastolic heart failure     Echo 8/12: Moderate LVH, EF 55-60%, mild MR, mild BAE, PASP 60. Myoview 8/12: Negative for ischemia, normal LV function.   . DNR (do not resuscitate)   . Morbid obesity     Past Surgical History  Procedure Date  . Appendectomy 1945  . Ankle surgery     Pin placed and later removed  . 2-d echocardiogram 04/30/2011  . Negative myoview stress test 05/07/11    for ischemia or infarction. EF of 62%    Family History  Problem Relation Age of Onset  . Heart attack Son     Social History:  reports that she has never smoked. Her smokeless tobacco use includes Snuff. She reports that she does not drink alcohol or use illicit drugs.  Allergies: No Known Allergies  Medications: I have reviewed the patient's current medications.  Results for orders placed during the hospital encounter of 12/14/11 (from the past 48 hour(s))  MRSA PCR SCREENING     Status:  Normal   Collection Time   12/14/11  7:26 PM      Component Value Range Comment   MRSA by PCR NEGATIVE  NEGATIVE    CBC     Status: Abnormal   Collection Time   12/14/11  9:01 PM      Component Value Range Comment   WBC 9.6  4.0 - 10.5 (K/uL)    RBC 2.88 (*) 3.87 - 5.11 (MIL/uL)    Hemoglobin 8.4 (*) 12.0 - 15.0 (g/dL)    HCT 40.9 (*) 81.1 - 46.0 (%)    MCV 87.2  78.0 - 100.0 (fL)    MCH 29.2  26.0 - 34.0 (pg)    MCHC 33.5  30.0 - 36.0 (g/dL)    RDW 91.4  78.2 - 95.6 (%)    Platelets 157  150 - 400 (K/uL)   BASIC METABOLIC PANEL     Status: Abnormal   Collection Time   12/14/11  9:01 PM      Component Value Range Comment   Sodium 139  135 - 145 (mEq/L)    Potassium 3.8  3.5 - 5.1 (mEq/L)    Chloride 100  96 - 112 (mEq/L)    CO2 29  19 - 32 (mEq/L)    Glucose, Bld 111 (*) 70 -  99 (mg/dL)    BUN 23  6 - 23 (mg/dL)    Creatinine, Ser 1.61 (*) 0.50 - 1.10 (mg/dL)    Calcium 9.0  8.4 - 10.5 (mg/dL)    GFR calc non Af Amer 27 (*) >90 (mL/min)    GFR calc Af Amer 32 (*) >90 (mL/min)   PROTIME-INR     Status: Abnormal   Collection Time   12/14/11  9:01 PM      Component Value Range Comment   Prothrombin Time 16.9 (*) 11.6 - 15.2 (seconds)    INR 1.35  0.00 - 1.49    CBC     Status: Abnormal   Collection Time   12/15/11  3:42 AM      Component Value Range Comment   WBC 9.2  4.0 - 10.5 (K/uL)    RBC 2.69 (*) 3.87 - 5.11 (MIL/uL)    Hemoglobin 7.9 (*) 12.0 - 15.0 (g/dL)    HCT 09.6 (*) 04.5 - 46.0 (%)    MCV 88.5  78.0 - 100.0 (fL)    MCH 29.4  26.0 - 34.0 (pg)    MCHC 33.2  30.0 - 36.0 (g/dL)    RDW 40.9  81.1 - 91.4 (%)    Platelets 155  150 - 400 (K/uL)   PROTIME-INR     Status: Abnormal   Collection Time   12/15/11  3:42 AM      Component Value Range Comment   Prothrombin Time 17.6 (*) 11.6 - 15.2 (seconds)    INR 1.42  0.00 - 1.49    COMPREHENSIVE METABOLIC PANEL     Status: Abnormal   Collection Time   12/15/11  3:42 AM      Component Value Range Comment   Sodium 137  135 -  145 (mEq/L)    Potassium 4.1  3.5 - 5.1 (mEq/L)    Chloride 100  96 - 112 (mEq/L)    CO2 28  19 - 32 (mEq/L)    Glucose, Bld 121 (*) 70 - 99 (mg/dL)    BUN 25 (*) 6 - 23 (mg/dL)    Creatinine, Ser 7.82 (*) 0.50 - 1.10 (mg/dL)    Calcium 8.7  8.4 - 10.5 (mg/dL)    Total Protein 5.7 (*) 6.0 - 8.3 (g/dL)    Albumin 3.3 (*) 3.5 - 5.2 (g/dL)    AST 39 (*) 0 - 37 (U/L)    ALT 23  0 - 35 (U/L)    Alkaline Phosphatase 40  39 - 117 (U/L)    Total Bilirubin 0.7  0.3 - 1.2 (mg/dL)    GFR calc non Af Amer 23 (*) >90 (mL/min)    GFR calc Af Amer 27 (*) >90 (mL/min)     Ct Head Wo Contrast  12/15/2011  *RADIOLOGY REPORT*  Clinical Data: Followup subarachnoid hemorrhage.  CT HEAD WITHOUT CONTRAST  Technique:  Contiguous axial images were obtained from the base of the skull through the vertex without contrast.  Comparison: 12/14/2011.  Findings: Slight increased amount of subarachnoid blood within the left hemisphere.  Interval development of left-sided subdural hematoma, broad-based and measuring up to 6.6 mm in maximal thickness.  Mild local mass effect.  Small vessel disease type changes. No CT evidence of large acute infarct. No intracranial mass lesion detected on this unenhanced exam.  Opacification right mastoid air cells.  Screw like metallic structure posterior to the left mastoid air cells unchanged.  IMPRESSION: Slight increased amount of subarachnoid blood within the left hemisphere.  Interval development of left-sided subdural hematoma, broad-based and measuring up to 6.6 mm in maximal thickness.  Mild local mass effect.  Critical Value/emergent results were called by telephone at the time of interpretation on 12/15/2011  at 6:55 a.m.  to  [facility] patients nurse., who verbally acknowledged these results.  Original Report Authenticated By: Fuller Canada, M.D.    Review of Systems  All other systems reviewed and are negative.   Blood pressure 95/39, pulse 62, temperature 98.3 F (36.8 C),  temperature source Oral, resp. rate 13, height 5\' 5"  (1.651 m), weight 73.2 kg (161 lb 6 oz), SpO2 100.00%. Physical Exam eccymosis left hip.  Left femoral neck fracture displaced. Assessment/Plan: Assessment: displaced left femoral neck fracture Plan: she will need left hip hemiarthroplasty, will plan for surgery Friday afternoon, she will need transfusion before surgery, I will need to speak to daughter, and have daughter sign consent.  My number [phone]  Fintan Grater V 12/15/2011, 7:22 AM

## 2011-12-15 NOTE — Progress Notes (Signed)
Clinical Social Work Department BRIEF PSYCHOSOCIAL ASSESSMENT 12/15/2011  Patient:  [name], [name]     Account Number:  [id number]     Admit date:  12/14/2011  Clinical Social Worker:  Pearson Forster  Date/Time:  12/15/2011 04:43 PM  Referred by:  Care Management  Date Referred:  12/15/2011 Referred for  SNF Placement   Other Referral:   Interview type:  Patient Other interview type:   Family- Daughter    PSYCHOSOCIAL DATA Living Status:  WITH ADULT CHILDREN Admitted from facility:   Level of care:   Primary support name:  Grant Fontana [phone] h [phone] c Primary support relationship to patient:  CHILD, ADULT Degree of support available:   Strong    CURRENT CONCERNS Current Concerns  Post-Acute Placement   Other Concerns:    SOCIAL WORK ASSESSMENT / PLAN Clinical Social Worker met with patient and patient daughter at the bedside to discuss plans and offer support. Discussed possible need of SNF placement for rehab upon discharge.  Patient daughter would be agreeable to SNF placement if necessary for patient upon discharge.  Patient daughter would like for patient to receive therapy at [facility] [place] and [facility] would be their second choice. CSW to facilitate discharge process and follow up with bed offers when they become available.  CSW to facilitate discharge needs once patient medically ready for discharge.   Assessment/plan status:  Psychosocial Support/Ongoing Assessment of Needs Other assessment/ plan:   Information/referral to community resources:   none at this time    PATIENT'S/FAMILY'S RESPONSE TO PLAN OF CARE: Patient was alert and oriented x3.  Patient had trouble understanding the SNF search process.  Patient gave permission for CSW to discuss discharge plans with her daughter.  Patient daughter agreeable to SNF placement if necessary and would like for the search to be in [facility] with [facility] [place] being  her first choice and [facility] [place] being the second choice   Arnette Norris, MSW Intern   Macario Golds, [place] [phone]

## 2011-12-15 NOTE — Progress Notes (Signed)
Subjective:  Patient is alert.  No chest pain or dyspnea.  Visiting with her grandchildren. Telemetry stable NSR.  Now on lower dose of amiodarone because of initial bradycardia.  Objective:  Vital Signs in the last 24 hours: Temp:  [98 F (36.7 C)-98.3 F (36.8 C)] 98 F (36.7 C) (04/02 0800) Pulse Rate:  [61-74] 67  (04/02 1030) Resp:  [11-20] 12  (04/02 1030) BP: (60-127)/(14-67) 110/33 mmHg (04/02 1030) SpO2:  [93 %-100 %] 100 % (04/02 1030) Weight:  [73.2 kg (161 lb 6 oz)] 73.2 kg (161 lb 6 oz) (04/01 2200)  Intake/Output from previous day: 04/01 0701 - 04/02 0700 In: 680 [I.V.:680] Out: 325 [Urine:325] Intake/Output from this shift: Total I/O In: 420 [P.O.:360; I.V.:10; IV Piggyback:50] Out: 45 [Urine:45]     . amiodarone  100 mg Oral Daily  . docusate sodium  100 mg Oral BID  . fluticasone  1 spray Each Nare Daily  . loratadine  10 mg Oral Daily  . pantoprazole  40 mg Oral Q1200  . phytonadione (VITAMIN K) IV  10 mg Intravenous Once  . phytonadione (VITAMIN K) IV  10 mg Intravenous Once  . simvastatin  20 mg Oral q1800  . DISCONTD: amiodarone  200 mg Oral Daily  . DISCONTD: furosemide  40 mg Oral Daily  . DISCONTD: isosorbide mononitrate  60 mg Oral Daily  . DISCONTD: potassium chloride SA  20 mEq Oral Daily      . 0.45 % NaCl with KCl 20 mEq / L 10 mL/hr at 12/15/11 0900    Physical Exam: The patient appears to be in no distress.  Complains of left hip pain.  Head and neck exam reveals that the pupils are equal and reactive.  The extraocular movements are full.  There is no scleral icterus.  Mouth and pharynx are benign.  No lymphadenopathy.  No carotid bruits.  The jugular venous pressure is normal.  Thyroid is not enlarged or tender.  Chest is clear to percussion and auscultation.  No rales or rhonchi.  Expansion of the chest is symmetrical.  Heart reveals no abnormal lift or heave.  First and second heart sounds are normal.  There is no murmur gallop  rub or click.  The abdomen is soft and nontender.  Bowel sounds are normoactive.  There is no hepatosplenomegaly or mass.  There are no abdominal bruits.  Extremities reveal no phlebitis or edema.  Pedal pulses are good.  There is no cyanosis or clubbing.   Integument reveals no rash  Lab Results:  Basename 12/15/11 0342 12/14/11 2101  WBC 9.2 9.6  HGB 7.9* 8.4*  PLT 155 157    Basename 12/15/11 0342 12/14/11 2101  NA 137 139  K 4.1 3.8  CL 100 100  CO2 28 29  GLUCOSE 121* 111*  BUN 25* 23  CREATININE 1.93* 1.67*   No results found for this basename: TROPONINI:2,CK,MB:2 in the last 72 hours Hepatic Function Panel  Basename 12/15/11 0342  PROT 5.7*  ALBUMIN 3.3*  AST 39*  ALT 23  ALKPHOS 40  BILITOT 0.7  BILIDIR --  IBILI --   No results found for this basename: CHOL in the last 72 hours No results found for this basename: PROTIME in the last 72 hours  Imaging: Ct Head Wo Contrast  12/15/2011  *RADIOLOGY REPORT*  Clinical Data: Followup subarachnoid hemorrhage.  CT HEAD WITHOUT CONTRAST  Technique:  Contiguous axial images were obtained from the base of the skull through the  vertex without contrast.  Comparison: 12/14/2011.  Findings: Slight increased amount of subarachnoid blood within the left hemisphere.  Interval development of left-sided subdural hematoma, broad-based and measuring up to 6.6 mm in maximal thickness.  Mild local mass effect.  Small vessel disease type changes. No CT evidence of large acute infarct. No intracranial mass lesion detected on this unenhanced exam.  Opacification right mastoid air cells.  Screw like metallic structure posterior to the left mastoid air cells unchanged.  IMPRESSION: Slight increased amount of subarachnoid blood within the left hemisphere.  Interval development of left-sided subdural hematoma, broad-based and measuring up to 6.6 mm in maximal thickness.  Mild local mass effect.  Critical Value/emergent results were called by  telephone at the time of interpretation on 12/15/2011  at 6:55 a.m.  to  [facility] patients nurse., who verbally acknowledged these results.  Original Report Authenticated By: Fuller Canada, M.D.    Cardiac Studies:  Assessment/Plan:  Patient Active Hospital Problem List: 1)Fracture of left hip secondary to dizziness and a fall. 2) Past history of paroxysmal atrial fib, currently in NSR 3) Past history of NSTEMI type II in Sept 2012              Had normal lexiscan 05/01/11 with EF 62% 4) Past history of diastolic HF with mild pulmonary hypertension PA pressure 46 by echo 04/30/11 5) Previous long-term warfarin, presently reversed. INR 1.46 6) Subdural hematoma and subarachnoid hemorrhage secondary to fall. 7) Anemia            Plan: Anticipate surgery later in week per Ortho note.  Will follow.  LOS: 1 day    Cassell Clement 12/15/2011, 10:45 AM

## 2011-12-15 NOTE — Progress Notes (Addendum)
[facility] Consult Note  Subjective: She appears comfortable and states she is feeling well. She appears a little confused and seems to have forgotten she has a fracture of the hip. Daughter and niece in room.   Objective: Blood pressure 103/26, pulse 64, temperature 98.4 F (36.9 C), temperature source Oral, resp. rate 14, height 5\' 5"  (1.651 m), weight 73.2 kg (161 lb 6 oz), SpO2 100.00%. Weight change:   Intake/Output Summary (Last 24 hours) at 12/15/11 1629 Last data filed at 12/15/11 1600  Gross per 24 hour  Intake   1290 ml  Output    500 ml  Net    790 ml    Physical Exam: General appearance: alert and cooperative Throat: dry mouth Lungs: clear to auscultation bilaterally Heart: regular rate and rhythm, S1, S2 normal, no murmur, click, rub or gallop Abdomen: soft, non-tender; bowel sounds normal; no masses,  no organomegaly Extremities: bruising of left hip. No edema of legs.   Lab Results:  [facility] 12/15/11 0342 12/14/11 2101  NA 137 139  K 4.1 3.8  CL 100 100  CO2 28 29  GLUCOSE 121* 111*  BUN 25* 23  CREATININE 1.93* 1.67*  CALCIUM 8.7 9.0  MG -- --  PHOS -- --    Basename 12/15/11 0342  AST 39*  ALT 23  ALKPHOS 40  BILITOT 0.7  PROT 5.7*  ALBUMIN 3.3*   No results found for this basename: LIPASE:2,AMYLASE:2 in the last 72 hours  Basename 12/15/11 1344 12/15/11 0342  WBC 9.7 9.2  NEUTROABS -- --  HGB 7.0* 7.9*  HCT 20.9* 23.8*  MCV 88.9 88.5  PLT 168 155   No results found for this basename: CKTOTAL:3,CKMB:3,CKMBINDEX:3,TROPONINI:3 in the last 72 hours No components found with this basename: POCBNP:3 No results found for this basename: DDIMER:2 in the last 72 hours No results found for this basename: HGBA1C:2 in the last 72 hours No results found for this basename: CHOL:2,HDL:2,LDLCALC:2,TRIG:2,CHOLHDL:2,LDLDIRECT:2 in the last 72 hours No results found for this basename: TSH,T4TOTAL,FREET3,T3FREE,THYROIDAB in the last 72 hours No  results found for this basename: VITAMINB12:2,FOLATE:2,FERRITIN:2,TIBC:2,IRON:2,RETICCTPCT:2 in the last 72 hours  Micro Results: Recent Results (from the past 240 hour(s))  MRSA PCR SCREENING     Status: Normal   Collection Time   12/14/11  7:26 PM      Component Value Range Status Comment   MRSA by PCR NEGATIVE  NEGATIVE  Final     Studies/Results: Ct Head Wo Contrast  12/15/2011  *RADIOLOGY REPORT*  Clinical Data: Followup subarachnoid hemorrhage.  CT HEAD WITHOUT CONTRAST  Technique:  Contiguous axial images were obtained from the base of the skull through the vertex without contrast.  Comparison: 12/14/2011.  Findings: Slight increased amount of subarachnoid blood within the left hemisphere.  Interval development of left-sided subdural hematoma, broad-based and measuring up to 6.6 mm in maximal thickness.  Mild local mass effect.  Small vessel disease type changes. No CT evidence of large acute infarct. No intracranial mass lesion detected on this unenhanced exam.  Opacification right mastoid air cells.  Screw like metallic structure posterior to the left mastoid air cells unchanged.  IMPRESSION: Slight increased amount of subarachnoid blood within the left hemisphere.  Interval development of left-sided subdural hematoma, broad-based and measuring up to 6.6 mm in maximal thickness.  Mild local mass effect.  Critical Value/emergent results were called by telephone at the time of interpretation on 12/15/2011  at 6:55 a.m.  to  [facility] patients nurse., who verbally acknowledged these  results.  Original Report Authenticated By: Fuller Canada, M.D.    Medications: Scheduled Meds:   . amiodarone  100 mg Oral Daily  . docusate sodium  100 mg Oral BID  . fluticasone  1 spray Each Nare Daily  . loratadine  10 mg Oral Daily  . pantoprazole  40 mg Oral Q1200  . phytonadione (VITAMIN K) IV  10 mg Intravenous Once  . phytonadione (VITAMIN K) IV  10 mg Intravenous Once  . simvastatin  20 mg Oral q1800    . DISCONTD: amiodarone  200 mg Oral Daily  . DISCONTD: furosemide  40 mg Oral Daily  . DISCONTD: isosorbide mononitrate  60 mg Oral Daily  . DISCONTD: potassium chloride SA  20 mEq Oral Daily   Continuous Infusions:   . 0.45 % NaCl with KCl 20 mEq / L 10 mL/hr at 12/15/11 0900   PRN Meds:.HYDROcodone-acetaminophen, morphine, morphine, nitroGLYCERIN, ondansetron (ZOFRAN) IV, ondansetron  Assessment/Plan:  Atrial fibrillation Due to frequent falls, she should not be on coumadin.   HTN BP low- lasix and imdur on hold. Replacing blood today.   Left femoral neck fracture Will need left hip hemiarthroplasty planned for Friday  Traumatic Subdural hematoma, subarachnoid hemorrhage  Acquired Coagulapathy due to Coumadin Given FFP- reversed to 1.26. She was given Vit K today by NS as well.   Anemia- posthemorrhagic Hgb 13.7 on 11/19/10 and now 7.0 Will order 2 units PRBC. I have spoken with the daughter and obtained consent.   CKD 3 Cr going up- Lasix held by cards  Bradycardia CArds following and adjusting medications.   H/O MI in 9/12  Chronic diastolic CHF and mod pulm HTN   LOS: 1 day   [facility] [phone] 12/15/2011, 4:29 PM

## 2011-12-15 NOTE — Progress Notes (Addendum)
Clinical Social Work Department CLINICAL SOCIAL WORK PLACEMENT NOTE 12/15/2011  Patient:  [name], [name]  Account Number:  [id number] Admit date:  12/14/2011  Clinical Social Worker:  Macario Golds, Theresia Majors  Date/time:  12/15/2011 03:00 PM  Clinical Social Work is seeking post-discharge placement for this patient at the following level of care:   SKILLED NURSING   (*CSW will update this form in Epic as items are completed)   12/15/2011  Patient/family provided with [facility] Department of Clinical Social Work's list of facilities offering this level of care within the geographic area requested by the patient (or if unable, by the patient's family).  12/15/2011  Patient/family informed of their freedom to choose among providers that offer the needed level of care, that participate in Medicare, Medicaid or managed care program needed by the patient, have an available bed and are willing to accept the patient.  12/15/2011  Patient/family informed of [facility]' ownership interest in [facility], as well as of the fact that they are under no obligation to receive care at this facility.  PASARR submitted to EDS on 12/15/2011 PASARR number received from EDS on 12/15/2011  FL2 transmitted to all facilities in geographic area requested by pt/family on  12/15/2011 FL2 transmitted to all facilities within larger geographic area on   Patient informed that his/her managed care company has contracts with or will negotiate with  certain facilities, including the following:     Patient/family informed of bed offers received:  12/22/11 Patient chooses bed at [facility] Physician recommends and patient chooses bed at  SNF  Patient to be transferred to  on  12/22/11 Patient to be transferred to facility by [facility]  The following physician request were entered in Epic:   Additional Comments: Pt d/c to [facility] today 12/22/11 via [facility]. Patient family preference is  [facility] and [facility] [place] [facility]  Arnette Norris, MSW Intern  [place], [place] [phone]

## 2011-12-15 NOTE — Progress Notes (Signed)
Subjective:  Patient resting comfortably in bed, asking for the beverage because of a dry mouth.  Objective: Vital signs in last 24 hours: Filed Vitals:   12/15/11 0530 12/15/11 0600 12/15/11 0630 12/15/11 0700  BP: 97/44 98/38 90/31  95/39  Pulse: 61 64 63 62  Temp:      TempSrc:      Resp: 13 13 13 13   Height:      Weight:      SpO2: 100% 100% 100% 100%    Intake/Output from previous day: 04/01 0701 - 04/02 0700 In: 605 [I.V.:605] Out: 325 [Urine:325] Intake/Output this shift:    Physical Exam:  Patient is awake, alert, and oriented to name, [place], but not to year. Following commands with all 4 extremities. Pupils are equal round and reactive to light, and approximately 1.5 mm bilaterally. Extraocular movements are intact. Facial movement is symmetrical. She has no drift of the upper extremities. Moving all 4 extremities, although the left lower extremity is limited due to pain from the left hip fracture.  CBC  Basename 12/15/11 0342 12/14/11 2101  WBC 9.2 9.6  HGB 7.9* 8.4*  HCT 23.8* 25.1*  PLT 155 157   BMET  Basename 12/15/11 0342 12/14/11 2101  NA 137 139  K 4.1 3.8  CL 100 100  CO2 28 29  GLUCOSE 121* 111*  BUN 25* 23  CREATININE 1.93* 1.67*  CALCIUM 8.7 9.0    Studies/Results: Ct Head Wo Contrast  12/15/2011  *RADIOLOGY REPORT*  Clinical Data: Followup subarachnoid hemorrhage.  CT HEAD WITHOUT CONTRAST  Technique:  Contiguous axial images were obtained from the base of the skull through the vertex without contrast.  Comparison: 12/14/2011.  Findings: Slight increased amount of subarachnoid blood within the left hemisphere.  Interval development of left-sided subdural hematoma, broad-based and measuring up to 6.6 mm in maximal thickness.  Mild local mass effect.  Small vessel disease type changes. No CT evidence of large acute infarct. No intracranial mass lesion detected on this unenhanced exam.  Opacification right mastoid air cells.  Screw like  metallic structure posterior to the left mastoid air cells unchanged.  IMPRESSION: Slight increased amount of subarachnoid blood within the left hemisphere.  Interval development of left-sided subdural hematoma, broad-based and measuring up to 6.6 mm in maximal thickness.  Mild local mass effect.  Critical Value/emergent results were called by telephone at the time of interpretation on 12/15/2011  at 6:55 a.m.  to  [facility] patients nurse., who verbally acknowledged these results.  Original Report Authenticated By: Fuller Canada, M.D.    Assessment/Plan: CT this morning shows interval development of a thin left hemispheric subdural hematoma, without mass effect or shift. We'll repeat CT of brain without in a.m. Prothrombin time last night elevated at 16.9, and more elevated this morning and 17.6. We'll give vitamin K 10 mg IV now and again at 1400. We'll recheck PT and INR in a.m. Neurologically stable, but will need to continue off of anticoagulants (including Coumadin) as well as antiplatelet agents for the foreseeable future, and probably should not be resumed due to her fall risk, and the balancing of risks versus theoretic benefit.   Hewitt Shorts, MD 12/15/2011, 7:48 AM

## 2011-12-16 ENCOUNTER — Inpatient Hospital Stay (HOSPITAL_COMMUNITY): Payer: Medicare Other

## 2011-12-16 ENCOUNTER — Encounter (HOSPITAL_COMMUNITY): Payer: Self-pay | Admitting: Radiology

## 2011-12-16 LAB — BASIC METABOLIC PANEL
BUN: 34 mg/dL — ABNORMAL HIGH (ref 6–23)
Calcium: 8.5 mg/dL (ref 8.4–10.5)
GFR calc Af Amer: 24 mL/min — ABNORMAL LOW (ref >90)
GFR calc non Af Amer: 20 mL/min — ABNORMAL LOW (ref >90)
Potassium: 4.1 mEq/L (ref 3.5–5.1)
Sodium: 134 mEq/L — ABNORMAL LOW (ref 135–145)

## 2011-12-16 LAB — PROTIME-INR
INR: 1.08 (ref 0.00–1.49)
Prothrombin Time: 14.2 seconds (ref 11.6–15.2)

## 2011-12-16 LAB — CBC
MCHC: 33.9 g/dL (ref 30.0–36.0)
RDW: 15.2 % (ref 11.5–15.5)

## 2011-12-16 MED ORDER — VITAMIN K1 10 MG/ML IJ SOLN
10.0000 mg | Freq: Once | INTRAVENOUS | Status: AC
  Administered 2011-12-16: 10 mg via INTRAVENOUS
  Filled 2011-12-16: qty 1

## 2011-12-16 NOTE — Progress Notes (Addendum)
Patient ID: [name], female   DOB: [birth year], 78 y.o.   MRN: [id number]    Subjective: Denies headache this morning   Objective: Vital signs in last 24 hours: Temp:  [97.6 F (36.4 C)-99.6 F (37.6 C)] 98.3 F (36.8 C) (04/03 0800) Pulse Rate:  [61-75] 68  (04/03 0800) Resp:  [10-26] 21  (04/03 0800) BP: (60-146)/(26-104) 126/59 mmHg (04/03 0800) SpO2:  [92 %-100 %] 99 % (04/03 0800)    Intake/Output this shift: Total I/O In: 20 [I.V.:20] Out: 125 [Urine:125]  Physical Exam: BP 126/59  Pulse 68  Temp(Src) 98.3 F (36.8 C) (Oral)  Resp 21  Ht 5\' 5"  (1.651 m)  Wt 161 lb 6 oz (73.2 kg)  BMI 26.85 kg/m2  SpO2 99% Neuro grossly intact Lungs clear Abdomen soft, ND  Labs: CBC  Basename 12/16/11 0400 12/15/11 1344  WBC 8.9 9.7  HGB 9.9* 7.0*  HCT 29.2* 20.9*  PLT 90* 168   BMET  Basename 12/16/11 0400 12/15/11 0342  NA 134* 137  K 4.1 4.1  CL 96 100  CO2 28 28  GLUCOSE 102* 121*  BUN 34* 25*  CREATININE 2.12* 1.93*  CALCIUM 8.5 8.7   LFT  Basename 12/15/11 0342  PROT 5.7*  ALBUMIN 3.3*  AST 39*  ALT 23  ALKPHOS 40  BILITOT 0.7  BILIDIR --  IBILI --  LIPASE --   PT/INR  Basename 12/16/11 0400 12/15/11 1344  LABPROT 14.2 16.1*  INR 1.08 1.26   ABG No results found for this basename: PHART:2,PCO2:2,PO2:2,HCO3:2 in the last 72 hours  Studies/Results: Ct Head Wo Contrast  12/16/2011  *RADIOLOGY REPORT*  Clinical Data: Follow-up intracranial hemorrhage  CT HEAD WITHOUT CONTRAST  Technique:  Contiguous axial images were obtained from the base of the skull through the vertex without contrast.  Comparison: 12/15/2011  Findings: Small amount of subarachnoid hemorrhage evident within the sulci of the left hemisphere appears the same.  No evidence of increased subarachnoid bleeding.  Thin subdural hematoma along the convexity on the left is no larger.  Maximal thickness remains 5 mm.  No significant mass effect upon the brain.  No other new finding.  No  parenchymal brain injury is seen.  Chronic small vessel changes are again noted throughout the Bernstein matter.  No hydrocephalus.  No visible skull fracture.  No traumatic fluid in the sinuses.  IMPRESSION: No change in small amount of subarachnoid hemorrhage on the left.  No increase in thin subdural hematoma along the left convexity, maximal thickness 5 mm, without demonstrable mass effect.  Original Report Authenticated By: Thomasenia Sales, M.D.   Ct Head Wo Contrast  12/15/2011  *RADIOLOGY REPORT*  Clinical Data: Followup subarachnoid hemorrhage.  CT HEAD WITHOUT CONTRAST  Technique:  Contiguous axial images were obtained from the base of the skull through the vertex without contrast.  Comparison: 12/14/2011.  Findings: Slight increased amount of subarachnoid blood within the left hemisphere.  Interval development of left-sided subdural hematoma, broad-based and measuring up to 6.6 mm in maximal thickness.  Mild local mass effect.  Small vessel disease type changes. No CT evidence of large acute infarct. No intracranial mass lesion detected on this unenhanced exam.  Opacification right mastoid air cells.  Screw like metallic structure posterior to the left mastoid air cells unchanged.  IMPRESSION: Slight increased amount of subarachnoid blood within the left hemisphere.  Interval development of left-sided subdural hematoma, broad-based and measuring up to 6.6 mm in maximal thickness.  Mild local mass effect.  Critical Value/emergent results were called by telephone at the time of interpretation on 12/15/2011  at 6:55 a.m.  to  [facility] patients nurse., who verbally acknowledged these results.  Original Report Authenticated By: Fuller Canada, M.D.    Assessment/Plan: No change in [facility] No general trauma issues at this point Plans per neurosurg and ortho   LOS: 2 days    Seraphina Mitchner A MD 12/16/2011 8:50 AM

## 2011-12-16 NOTE — Progress Notes (Signed)
Subjective:  Patient is alert.  No chest pain or dyspnea.   Telemetry stable NSR.  Now on lower dose of amiodarone because of initial bradycardia.  Objective:  Vital Signs in the last 24 hours: Temp:  [97.6 F (36.4 C)-99.6 F (37.6 C)] 98.1 F (36.7 C) (04/03 0400) Pulse Rate:  [61-75] 68  (04/03 0800) Resp:  [10-26] 21  (04/03 0800) BP: (60-146)/(26-104) 126/59 mmHg (04/03 0800) SpO2:  [92 %-100 %] 99 % (04/03 0800)  Intake/Output from previous day: 04/02 0701 - 04/03 0700 In: 1500 [P.O.:440; I.V.:330; Blood:630; IV Piggyback:100] Out: 505 [Urine:505] Intake/Output from this shift: Total I/O In: 20 [I.V.:20] Out: 125 [Urine:125]     . amiodarone  100 mg Oral Daily  . docusate sodium  100 mg Oral BID  . fluticasone  1 spray Each Nare Daily  . loratadine  10 mg Oral Daily  . pantoprazole  40 mg Oral Q1200  . phytonadione (VITAMIN K) IV  10 mg Intravenous Once  . phytonadione (VITAMIN K) IV  10 mg Intravenous Once  . phytonadione (VITAMIN K) IV  10 mg Intravenous Once  . simvastatin  20 mg Oral q1800      . 0.45 % NaCl with KCl 20 mEq / L 20 mL/hr at 12/16/11 0300    Physical Exam: The patient appears to be in no distress.  Complains of left hip pain.  Head and neck exam reveals that the pupils are equal and reactive.  The extraocular movements are full.  There is no scleral icterus.  Mouth and pharynx are benign.  No lymphadenopathy.  No carotid bruits.  The jugular venous pressure is normal.  Thyroid is not enlarged or tender.  Chest is clear to percussion and auscultation.  No rales or rhonchi.  Expansion of the chest is symmetrical.  Heart reveals no abnormal lift or heave.  First and second heart sounds are normal.  There is no murmur gallop rub or click.  The abdomen is soft and nontender.  Bowel sounds are normoactive.  There is no hepatosplenomegaly or mass.  There are no abdominal bruits.  Extremities reveal no phlebitis or edema.  Pedal pulses are  good.  There is no cyanosis or clubbing.   Integument reveals no rash  Lab Results:  Basename 12/16/11 0400 12/15/11 1344  WBC 8.9 9.7  HGB 9.9* 7.0*  PLT 90* 168    Basename 12/16/11 0400 12/15/11 0342  NA 134* 137  K 4.1 4.1  CL 96 100  CO2 28 28  GLUCOSE 102* 121*  BUN 34* 25*  CREATININE 2.12* 1.93*   No results found for this basename: TROPONINI:2,CK,MB:2 in the last 72 hours Hepatic Function Panel  Basename 12/15/11 0342  PROT 5.7*  ALBUMIN 3.3*  AST 39*  ALT 23  ALKPHOS 40  BILITOT 0.7  BILIDIR --  IBILI --   No results found for this basename: CHOL in the last 72 hours No results found for this basename: PROTIME in the last 72 hours  Imaging: Ct Head Wo Contrast  12/16/2011  *RADIOLOGY REPORT*  Clinical Data: Follow-up intracranial hemorrhage  CT HEAD WITHOUT CONTRAST  Technique:  Contiguous axial images were obtained from the base of the skull through the vertex without contrast.  Comparison: 12/15/2011  Findings: Small amount of subarachnoid hemorrhage evident within the sulci of the left hemisphere appears the same.  No evidence of increased subarachnoid bleeding.  Thin subdural hematoma along the convexity on the left is no larger.  Maximal  thickness remains 5 mm.  No significant mass effect upon the brain.  No other new finding.  No parenchymal brain injury is seen.  Chronic small vessel changes are again noted throughout the [place] matter.  No hydrocephalus.  No visible skull fracture.  No traumatic fluid in the sinuses.  IMPRESSION: No change in small amount of subarachnoid hemorrhage on the left.  No increase in thin subdural hematoma along the left convexity, maximal thickness 5 mm, without demonstrable mass effect.  Original Report Authenticated By: Thomasenia Sales, M.D.   Ct Head Wo Contrast  12/15/2011  *RADIOLOGY REPORT*  Clinical Data: Followup subarachnoid hemorrhage.  CT HEAD WITHOUT CONTRAST  Technique:  Contiguous axial images were obtained from the  base of the skull through the vertex without contrast.  Comparison: 12/14/2011.  Findings: Slight increased amount of subarachnoid blood within the left hemisphere.  Interval development of left-sided subdural hematoma, broad-based and measuring up to 6.6 mm in maximal thickness.  Mild local mass effect.  Small vessel disease type changes. No CT evidence of large acute infarct. No intracranial mass lesion detected on this unenhanced exam.  Opacification right mastoid air cells.  Screw like metallic structure posterior to the left mastoid air cells unchanged.  IMPRESSION: Slight increased amount of subarachnoid blood within the left hemisphere.  Interval development of left-sided subdural hematoma, broad-based and measuring up to 6.6 mm in maximal thickness.  Mild local mass effect.  Critical Value/emergent results were called by telephone at the time of interpretation on 12/15/2011  at 6:55 a.m.  to  [facility] patients nurse., who verbally acknowledged these results.  Original Report Authenticated By: Fuller Canada, M.D.    Cardiac Studies:  Assessment/Plan:  Patient Active Hospital Problem List: 1)Fracture of left hip secondary to dizziness and a fall. 2) Past history of paroxysmal atrial fib, currently in NSR 3) Past history of NSTEMI type II in Sept 2012              Had normal lexiscan 05/01/11 with EF 62% 4) Past history of diastolic HF with mild pulmonary hypertension PA pressure 46 by echo 04/30/11 5) Previous long-term warfarin, presently reversed. INR 1.08 6) Subdural hematoma and subarachnoid hemorrhage secondary to fall. 7) Anemia --improved after transfusion yesterday.  Hgb 9.9.  Platelets down 90000.           Plan: Okay for proposed hip surgery Friday afternoon from cardiac standpoint.  Continue present dose of amiodarone 100 mg daily  LOS: 2 days    [name] 12/16/2011, 8:42 AM

## 2011-12-16 NOTE — Progress Notes (Signed)
Patient ID: [name], female   DOB: 1927-06-15, 78 y.o.   MRN: [id number] Plan for left hip hemi arthroplasty Friday afternoon, NPO after MN thursday

## 2011-12-16 NOTE — Progress Notes (Signed)
Subjective: Patient resting in bed comfortable, unless there is movement of her left lower extremity which causes pain from the left hip fracture. CT this morning shows no significant change in size of thin left hemispheric subdural hematoma. PT and INR improved after 2 doses of vitamin K.  Objective: Vital signs in last 24 hours: Filed Vitals:   12/16/11 0530 12/16/11 0600 12/16/11 0630 12/16/11 0700  BP: 146/104 130/40 127/43 116/41  Pulse: 75 70 71 70  Temp:      TempSrc:      Resp: 21 23 22 26   Height:      Weight:      SpO2: 99% 93% 94% 92%    Intake/Output from previous day: 04/02 0701 - 04/03 0700 In: 1500 [P.O.:440; I.V.:330; Blood:630; IV Piggyback:100] Out: 505 [Urine:505] Intake/Output this shift:    Physical Exam:  Patient is awake, alert, oriented to name, hospital, [place], but not the year. She follows commands with all 4 extremities. Her speech is fluent. Pupils are equal round and reactive to light. Extraocular movements are intact. Facial movement is symmetrical. She moves all 4 extremities, although he limits movement of the left lower extremity the hip fracture. She has no drift of the upper extremities.  CBC  Basename 12/16/11 0400 12/15/11 1344  WBC 8.9 9.7  HGB 9.9* 7.0*  HCT 29.2* 20.9*  PLT 90* 168   BMET  Basename 12/16/11 0400 12/15/11 0342  NA 134* 137  K 4.1 4.1  CL 96 100  CO2 28 28  GLUCOSE 102* 121*  BUN 34* 25*  CREATININE 2.12* 1.93*  CALCIUM 8.5 8.7    Studies/Results: Ct Head Wo Contrast  12/16/2011  *RADIOLOGY REPORT*  Clinical Data: Follow-up intracranial hemorrhage  CT HEAD WITHOUT CONTRAST  Technique:  Contiguous axial images were obtained from the base of the skull through the vertex without contrast.  Comparison: 12/15/2011  Findings: Small amount of subarachnoid hemorrhage evident within the sulci of the left hemisphere appears the same.  No evidence of increased subarachnoid bleeding.  Thin subdural hematoma along the  convexity on the left is no larger.  Maximal thickness remains 5 mm.  No significant mass effect upon the brain.  No other new finding.  No parenchymal brain injury is seen.  Chronic small vessel changes are again noted throughout the [place] matter.  No hydrocephalus.  No visible skull fracture.  No traumatic fluid in the sinuses.  IMPRESSION: No change in small amount of subarachnoid hemorrhage on the left.  No increase in thin subdural hematoma along the left convexity, maximal thickness 5 mm, without demonstrable mass effect.  Original Report Authenticated By: Thomasenia Sales, M.D.   Ct Head Wo Contrast  12/15/2011  *RADIOLOGY REPORT*  Clinical Data: Followup subarachnoid hemorrhage.  CT HEAD WITHOUT CONTRAST  Technique:  Contiguous axial images were obtained from the base of the skull through the vertex without contrast.  Comparison: 12/14/2011.  Findings: Slight increased amount of subarachnoid blood within the left hemisphere.  Interval development of left-sided subdural hematoma, broad-based and measuring up to 6.6 mm in maximal thickness.  Mild local mass effect.  Small vessel disease type changes. No CT evidence of large acute infarct. No intracranial mass lesion detected on this unenhanced exam.  Opacification right mastoid air cells.  Screw like metallic structure posterior to the left mastoid air cells unchanged.  IMPRESSION: Slight increased amount of subarachnoid blood within the left hemisphere.  Interval development of left-sided subdural hematoma, broad-based and measuring up to 6.6  mm in maximal thickness.  Mild local mass effect.  Critical Value/emergent results were called by telephone at the time of interpretation on 12/15/2011  at 6:55 a.m.  to  [facility] patients nurse., who verbally acknowledged these results.  Original Report Authenticated By: Fuller Canada, M.D.    Assessment/Plan: Patient neurologically stable. CT stable. PT and INR improved. We'll give 1 more dose of vitamin K 10 mg IV  this morning and recheck CT brain without contrast on Friday morning in anticipation of orthopedic surgery on Friday afternoon.   Hewitt Shorts, MD 12/16/2011, 8:29 AM

## 2011-12-16 NOTE — Progress Notes (Signed)
[facility] - CONSULT F/U NOTE [place] TEAM 1 - Stepdown/ICU TEAM  Subjective: 85yoF with h/o AFib/Coumadin, diastolic HF (EF 60%), CKD stage III, NSTEMI 04/2011 medically  treated, h/o colon/kidney/ovarian cancer s/p surgery presents as a transfer from [facility] with left hip fracture, left SAH. She has been admitted to the Trauma Service, and TRH, Cardiology, NS, and Orthopedics are following as consults.  The pt is resting comfortably at the present time.  She c/o only minor HA, and occasional pain in the L shoulder, as well as her hip.  She denies cp, sob, n/v, or abdom pain.  Objective: Weight change:   Intake/Output Summary (Last 24 hours) at 12/16/11 1252 Last data filed at 12/16/11 1200  Gross per 24 hour  Intake   1100 ml  Output    615 ml  Net    485 ml   Blood pressure 104/74, pulse 68, temperature 98.5 F (36.9 C), temperature source Oral, resp. rate 24, height 5\' 5"  (1.651 m), weight 73.2 kg (161 lb 6 oz), SpO2 99.00%.  CBG (last 3)  No results found for this basename: GLUCAP:3 in the last 72 hours   Physical Exam: General: No acute respiratory distress Lungs: Clear to auscultation bilaterally without wheezes or crackles Cardiovascular: Regular rate and rhythm without murmur gallop or rub  Abdomen: Nontender, nondistended, soft, bowel sounds positive, no rebound, no ascites, no appreciable mass Extremities: No significant cyanosis, clubbing, or edema bilateral lower extremities  Lab Results:  Basename 12/16/11 0400 12/15/11 0342 12/14/11 2101  NA 134* 137 139  K 4.1 4.1 3.8  CL 96 100 100  CO2 28 28 29   GLUCOSE 102* 121* 111*  BUN 34* 25* 23  CREATININE 2.12* 1.93* 1.67*  CALCIUM 8.5 8.7 9.0  MG -- -- --  PHOS -- -- --    Basename 12/15/11 0342  AST 39*  ALT 23  ALKPHOS 40  BILITOT 0.7  PROT 5.7*  ALBUMIN 3.3*    Basename 12/16/11 0400 12/15/11 1344 12/15/11 0342  WBC 8.9 9.7 9.2  NEUTROABS -- -- --  HGB 9.9* 7.0* 7.9*  HCT  29.2* 20.9* 23.8*  MCV 84.4 88.9 88.5  PLT 90* 168 155   Micro Results: Recent Results (from the past 240 hour(s))  MRSA PCR SCREENING     Status: Normal   Collection Time   12/14/11  7:26 PM      Component Value Range Status Comment   MRSA by PCR NEGATIVE  NEGATIVE  Final     Studies/Results: All recent x-ray/radiology reports have been reviewed in detail.   Medications: I have reviewed the patient's complete medication list.  Recomendations:  Paroxsymal  Atrial fibrillation  [place] [facility] is following - no longer a coumadin candidate due to frequent falls and acute SDH   history of NSTEMI type II in Sept 2012 As per Cards consult   Diastolic CHF Appears clinically dry today - gently hydrate and follow   HTN  BP fluctuating - care w/ anti-HTN meds - follow trend  Left femoral neck fracture  Will need left hip hemiarthroplasty - Ortho following - surgery planned for Friday   Traumatic Subdural hematoma, subarachnoid hemorrhage  Stable via CT head - NS following - plan is for f/u CT head Fri morning prior to Ortho surgery  Acquired Coagulapathy due to Coumadin  Given FFP and vitamin K -  reversed fully   Anemia- posthemorrhagic  Hgb improved as expected s/p 2U PRBC - follow trend as pt  is hydrated  CKD 3  Cr rising - appears dry - will gently hydrate and follow   Bradycardia  Improved with decrease in amio dose  thrombocytopenia Follow trend   Dispo We will cont to follow along with you  Lonia Blood, MD [facility] Office  [phone] Pager [phone]  On-Call/Text Page:      [email or web address]      password [facility]

## 2011-12-17 LAB — CBC
Hemoglobin: 9.1 g/dL — ABNORMAL LOW (ref 12.0–15.0)
MCHC: 34.6 g/dL (ref 30.0–36.0)
Platelets: 96 10*3/uL — ABNORMAL LOW (ref 150–400)
RDW: 15.2 % (ref 11.5–15.5)

## 2011-12-17 LAB — BASIC METABOLIC PANEL
BUN: 27 mg/dL — ABNORMAL HIGH (ref 6–23)
Calcium: 8.1 mg/dL — ABNORMAL LOW (ref 8.4–10.5)
GFR calc Af Amer: 39 mL/min — ABNORMAL LOW (ref >90)
GFR calc non Af Amer: 34 mL/min — ABNORMAL LOW (ref >90)
Potassium: 4.5 mEq/L (ref 3.5–5.1)
Sodium: 130 mEq/L — ABNORMAL LOW (ref 135–145)

## 2011-12-17 MED ORDER — CEFAZOLIN SODIUM 1-5 GM-% IV SOLN
1.0000 g | INTRAVENOUS | Status: AC
  Administered 2011-12-18: 1 g via INTRAVENOUS
  Filled 2011-12-17: qty 50

## 2011-12-17 MED ORDER — CHLORHEXIDINE GLUCONATE 4 % EX LIQD
60.0000 mL | Freq: Once | CUTANEOUS | Status: AC
  Administered 2011-12-18: 4 via TOPICAL
  Filled 2011-12-17 (×2): qty 60

## 2011-12-17 NOTE — Clinical Documentation Improvement (Signed)
Anemia Blood Loss Clarification  THIS DOCUMENT IS NOT A PERMANENT PART OF THE MEDICAL RECORD  RESPOND TO THE THIS QUERY, FOLLOW THE INSTRUCTIONS BELOW:  1. If needed, update documentation for the patient's encounter via the notes activity.  2. Access this query again and click edit on the In [employer].  3. After updating, or not, click F2 to complete all highlighted (required) fields concerning your review. Select "additional documentation in the medical record" OR "no additional documentation provided".  4. Click Sign note button.  5. The deficiency will fall out of your In Basket *Please let us know if you are not able to complete this workflow by phone or e-mail (listed below).        12/17/11  Dear Dr. Donell Beers Marton Redwood  In an effort to better capture your patient's severity of illness, reflect appropriate length of stay and utilization of resources, a review of the patient medical record has revealed the following indicators.   Based on your clinical judgment, please clarify and document in a progress note and/or discharge summary the clinical condition associated with the following supporting information: In responding to this query please exercise your independent judgment.  The fact that a query is asked, does not imply that any particular answer is desired or expected.  "Anemia- posthemorrhagic" is documented in the progress notes. Please consider the below (if your clinical findings/judgment agree) as you document the patient's diagnosis/condition(s) in the progress note and discharge summary. Thank you!  Possible Clinical Conditions?  - acute blood loss anemia  - Other condition (please document in the progress notes and/or discharge summary)  - Cannot Clinically determine at this time   Supporting Information:  - Progress note 4/2 (rizwan): "Anemia- posthemorrhagic  Hgb 13.7 on 11/19/10 and now 7.0  Will order 2 units PRBC."  - "Acute on chronic anemia" progress  note 4/2 (rayburn)   - "Anemia --improved after transfusion yesterday." progress note 4/3 (brackbill)  - "Anemia- posthemorrhagic  Hgb improved as expected s/p 2U PRBC" progress note 4/3 (mcClung)  Component HGB HCT  Latest Ref Rng 12.0 - 15.0 g/dL 40.9 - 81.1 %  05/15/4781 13.7 39.9  12/14/2011 8.4 (L) 25.1 (L)  12/15/2011 7.9 (L) 23.8 (L)  12/15/2011 7.0 (L) 20.9 (L)  12/16/2011 9.9 (L) 29.2 (L)  12/17/2011 9.1 (L) 26.3 (L)      Reviewed:  no additional documentation provided  Thank Darden Palmer  Clinical Documentation Specialist: [phone] Pager  Health Information Management [place]   Acute blood loss anemia is the same as post hemorrhagic anemia.

## 2011-12-17 NOTE — Progress Notes (Signed)
Physical medicine rehabilitation requested and chart reviewed. Plan currently is for left hip surgery tomorrow per Dr. Lajoyce Corners. Will hold on formal rehabilitation consult at this time and reconsult after procedure completed and physical and occupational therapy evaluations have been ordered

## 2011-12-17 NOTE — Progress Notes (Signed)
Subjective:  Patient resting comfortably in bed, without new complaints. She denies headache.  Objective: Vital signs in last 24 hours: Filed Vitals:   12/17/11 0800 12/17/11 0830 12/17/11 0900 12/17/11 1000  BP: 118/38  122/40 135/61  Pulse: 58  66 65  Temp:  98.2 F (36.8 C)    TempSrc:  Axillary    Resp: 26  17 18   Height:      Weight:      SpO2: 99%  99% 99%    Intake/Output from previous day: 04/03 0701 - 04/04 0700 In: 1763.9 [P.O.:360; I.V.:1353.9; IV Piggyback:50] Out: 860 [Urine:860] Intake/Output this shift: Total I/O In: 540 [P.O.:240; I.V.:300] Out: 12 [Urine:75]  Physical Exam:  Patient is awake and alert, oriented to name and hospital, but not severe. She follows commands. Her speech is fluent. Cranial nerves show pupils are 2.5 mm, round and reactive to light extra ocular movements are intact. Facial movement is symmetrical. She has good strength in all 4 extremities (although we are not able to fully test the left lower extremity due to her hip fracture), she has no drift of the upper extremities.  CBC  Basename 12/17/11 0400 12/16/11 0400  WBC 5.9 8.9  HGB 9.1* 9.9*  HCT 26.3* 29.2*  PLT 96* 90*   BMET  Basename 12/17/11 0400 12/16/11 0400  NA 130* 134*  K 4.5 4.1  CL 96 96  CO2 26 28  GLUCOSE 88 102*  BUN 27* 34*  CREATININE 1.40* 2.12*  CALCIUM 8.1* 8.5    Studies/Results: Ct Head Wo Contrast  12/16/2011  *RADIOLOGY REPORT*  Clinical Data: Follow-up intracranial hemorrhage  CT HEAD WITHOUT CONTRAST  Technique:  Contiguous axial images were obtained from the base of the skull through the vertex without contrast.  Comparison: 12/15/2011  Findings: Small amount of subarachnoid hemorrhage evident within the sulci of the left hemisphere appears the same.  No evidence of increased subarachnoid bleeding.  Thin subdural hematoma along the convexity on the left is no larger.  Maximal thickness remains 5 mm.  No significant mass effect upon the brain.  No  other new finding.  No parenchymal brain injury is seen.  Chronic small vessel changes are again noted throughout the [place] matter.  No hydrocephalus.  No visible skull fracture.  No traumatic fluid in the sinuses.  IMPRESSION: No change in small amount of subarachnoid hemorrhage on the left.  No increase in thin subdural hematoma along the left convexity, maximal thickness 5 mm, without demonstrable mass effect.  Original Report Authenticated By: Thomasenia Sales, M.D.    Assessment/Plan: Patient remained stable neurologically, we will plan on checking a CT scan of the brain without contrast in the morning. I spoke with the patient and her daughter who is present about her condition and her plans for further evaluation and care.   Hewitt Shorts, MD 12/17/2011, 10:43 AM

## 2011-12-17 NOTE — Progress Notes (Signed)
[facility] Consult Note  Subjective: C/o pain in her leg- appears very uncomfortable.    Objective: Blood pressure 137/50, pulse 72, temperature 97.6 F (36.4 C), temperature source Oral, resp. rate 24, height 5\' 5"  (1.651 m), weight 73.2 kg (161 lb 6 oz), SpO2 96.00%. Weight change:   Intake/Output Summary (Last 24 hours) at 12/17/11 1754 Last data filed at 12/17/11 1700  Gross per 24 hour  Intake   2520 ml  Output    975 ml  Net   1545 ml    Physical Exam: General appearance: alert and cooperative Throat: dry mouth Lungs: clear to auscultation bilaterally Heart: regular rate and rhythm, S1, S2 normal, no murmur, click, rub or gallop Abdomen: soft, non-tender; bowel sounds normal; no masses,  no organomegaly Extremities: bruising of left hip. No edema of legs.   Lab Results:  Basename 12/17/11 0400 12/16/11 0400  NA 130* 134*  K 4.5 4.1  CL 96 96  CO2 26 28  GLUCOSE 88 102*  BUN 27* 34*  CREATININE 1.40* 2.12*  CALCIUM 8.1* 8.5  MG -- --  PHOS -- --    Basename 12/15/11 0342  AST 39*  ALT 23  ALKPHOS 40  BILITOT 0.7  PROT 5.7*  ALBUMIN 3.3*   No results found for this basename: LIPASE:2,AMYLASE:2 in the last 72 hours  Basename 12/17/11 0400 12/16/11 0400  WBC 5.9 8.9  NEUTROABS -- --  HGB 9.1* 9.9*  HCT 26.3* 29.2*  MCV 84.8 84.4  PLT 96* 90*   No results found for this basename: CKTOTAL:3,CKMB:3,CKMBINDEX:3,TROPONINI:3 in the last 72 hours No components found with this basename: POCBNP:3 No results found for this basename: DDIMER:2 in the last 72 hours No results found for this basename: HGBA1C:2 in the last 72 hours No results found for this basename: CHOL:2,HDL:2,LDLCALC:2,TRIG:2,CHOLHDL:2,LDLDIRECT:2 in the last 72 hours No results found for this basename: TSH,T4TOTAL,FREET3,T3FREE,THYROIDAB in the last 72 hours No results found for this basename: VITAMINB12:2,FOLATE:2,FERRITIN:2,TIBC:2,IRON:2,RETICCTPCT:2 in the last 72 hours  Micro  Results: Recent Results (from the past 240 hour(s))  MRSA PCR SCREENING     Status: Normal   Collection Time   12/14/11  7:26 PM      Component Value Range Status Comment   MRSA by PCR NEGATIVE  NEGATIVE  Final     Studies/Results: Ct Head Wo Contrast  12/15/2011  *RADIOLOGY REPORT*  Clinical Data: Followup subarachnoid hemorrhage.  CT HEAD WITHOUT CONTRAST  Technique:  Contiguous axial images were obtained from the base of the skull through the vertex without contrast.  Comparison: 12/14/2011.  Findings: Slight increased amount of subarachnoid blood within the left hemisphere.  Interval development of left-sided subdural hematoma, broad-based and measuring up to 6.6 mm in maximal thickness.  Mild local mass effect.  Small vessel disease type changes. No CT evidence of large acute infarct. No intracranial mass lesion detected on this unenhanced exam.  Opacification right mastoid air cells.  Screw like metallic structure posterior to the left mastoid air cells unchanged.  IMPRESSION: Slight increased amount of subarachnoid blood within the left hemisphere.  Interval development of left-sided subdural hematoma, broad-based and measuring up to 6.6 mm in maximal thickness.  Mild local mass effect.  Critical Value/emergent results were called by telephone at the time of interpretation on 12/15/2011  at 6:55 a.m.  to  [facility] patients nurse., who verbally acknowledged these results.  Original Report Authenticated By: Fuller Canada, M.D.    Medications: Scheduled Meds:    . amiodarone  100  mg Oral Daily  .  ceFAZolin (ANCEF) IV  1 g Intravenous 60 min Pre-Op  . chlorhexidine  60 mL Topical Once  . docusate sodium  100 mg Oral BID  . fluticasone  1 spray Each Nare Daily  . loratadine  10 mg Oral Daily  . pantoprazole  40 mg Oral Q1200  . simvastatin  20 mg Oral q1800   Continuous Infusions:    . 0.45 % NaCl with KCl 20 mEq / L 75 mL/hr at 12/17/11 1741   PRN Meds:.HYDROcodone-acetaminophen,  morphine, morphine, nitroGLYCERIN, ondansetron (ZOFRAN) IV, ondansetron  Assessment/Plan:  Atrial fibrillation Due to frequent falls and intracranial bleeds, she should not be on coumadin.  Currently is in NSR  HTN BP low- lasix and imdur on hold due to low BP readings.   Left femoral neck fracture Will need left hip hemiarthroplasty planned for Friday  Traumatic Subdural hematoma, subarachnoid hemorrhage NS following. Repeat head CT tomorrow AM planned  Acquired Coagulapathy due to Coumadin Reversed with FFP and Vit K  Anemia- posthemorrhagic Hgb 13.7 on 11/19/10 dropping to 7.0 of 4/2.  2 units PRBC given.  Hgb now 9.1.   Acute Thrombocytopenia Probably Consumption due to acute bleed follow  CKD 3 Cr going up- Lasix held by cards  Bradycardia CArds following and adjusting medications.   H/O MI in 9/12  Chronic diastolic CHF and mod pulm HTN Follow while on IVF (Lasix on Hold)   LOS: 3 days   [facility] [phone] 12/17/2011, 5:54 PM

## 2011-12-17 NOTE — Progress Notes (Signed)
Subjective:  Patient is alert.  No chest pain or dyspnea.   Telemetry stable NSR.    Objective:  Vital Signs in the last 24 hours: Temp:  [98.1 F (36.7 C)-98.8 F (37.1 C)] 98.4 F (36.9 C) (04/04 0333) Pulse Rate:  [56-74] 60  (04/04 0700) Resp:  [10-24] 17  (04/04 0700) BP: (104-159)/(37-95) 129/42 mmHg (04/04 0700) SpO2:  [89 %-100 %] 99 % (04/04 0700)  Intake/Output from previous day: 04/03 0701 - 04/04 0700 In: 1763.9 [P.O.:360; I.V.:1353.9; IV Piggyback:50] Out: 860 [Urine:860] Intake/Output from this shift:       . amiodarone  100 mg Oral Daily  . docusate sodium  100 mg Oral BID  . fluticasone  1 spray Each Nare Daily  . loratadine  10 mg Oral Daily  . pantoprazole  40 mg Oral Q1200  . phytonadione (VITAMIN K) IV  10 mg Intravenous Once  . simvastatin  20 mg Oral q1800      . 0.45 % NaCl with KCl 20 mEq / L 75 mL/hr (12/17/11 0311)    Physical Exam: The patient appears to be in no distress.  Complains of left hip pain.  Head and neck exam reveals that the pupils are equal and reactive.  The extraocular movements are full.  There is no scleral icterus.  Mouth and pharynx are benign.  No lymphadenopathy.  No carotid bruits.  The jugular venous pressure is normal.  Thyroid is not enlarged or tender.  Chest is clear to percussion and auscultation.  No rales or rhonchi.  Expansion of the chest is symmetrical.  Heart reveals no abnormal lift or heave.  First and second heart sounds are normal.  There is no murmur gallop rub or click.  The abdomen is soft and nontender.  Bowel sounds are normoactive.  There is no hepatosplenomegaly or mass.  There are no abdominal bruits.  Extremities reveal no phlebitis or edema.  Pedal pulses are good.  There is no cyanosis or clubbing.   Integument reveals no rash  Lab Results:  Basename 12/17/11 0400 12/16/11 0400  WBC 5.9 8.9  HGB 9.1* 9.9*  PLT 96* 90*    Basename 12/17/11 0400 12/16/11 0400  NA 130* 134*  K  4.5 4.1  CL 96 96  CO2 26 28  GLUCOSE 88 102*  BUN 27* 34*  CREATININE 1.40* 2.12*   No results found for this basename: TROPONINI:2,CK,MB:2 in the last 72 hours Hepatic Function Panel  Basename 12/15/11 0342  PROT 5.7*  ALBUMIN 3.3*  AST 39*  ALT 23  ALKPHOS 40  BILITOT 0.7  BILIDIR --  IBILI --   No results found for this basename: CHOL in the last 72 hours No results found for this basename: PROTIME in the last 72 hours  Imaging: Ct Head Wo Contrast  12/16/2011  *RADIOLOGY REPORT*  Clinical Data: Follow-up intracranial hemorrhage  CT HEAD WITHOUT CONTRAST  Technique:  Contiguous axial images were obtained from the base of the skull through the vertex without contrast.  Comparison: 12/15/2011  Findings: Small amount of subarachnoid hemorrhage evident within the sulci of the left hemisphere appears the same.  No evidence of increased subarachnoid bleeding.  Thin subdural hematoma along the convexity on the left is no larger.  Maximal thickness remains 5 mm.  No significant mass effect upon the brain.  No other new finding.  No parenchymal brain injury is seen.  Chronic small vessel changes are again noted throughout the [place] matter.  No hydrocephalus.  No visible skull fracture.  No traumatic fluid in the sinuses.  IMPRESSION: No change in small amount of subarachnoid hemorrhage on the left.  No increase in thin subdural hematoma along the left convexity, maximal thickness 5 mm, without demonstrable mass effect.  Original Report Authenticated By: Thomasenia Sales, M.D.    Cardiac Studies:  Assessment/Plan:  Patient Active Hospital Problem List: 1)Fracture of left hip secondary to dizziness and a fall. 2) Past history of paroxysmal atrial fib, currently in NSR 3) Past history of NSTEMI type II in Sept 2012              Had normal lexiscan 05/01/11 with EF 62% 4) Past history of diastolic HF with mild pulmonary hypertension PA pressure 46 by echo 04/30/11 5) Previous long-term  warfarin, presently reversed. INR 1.08 6) Subdural hematoma and subarachnoid hemorrhage secondary to fall. 7) Anemia stable  Hgb 9.1.  Platelets up slightly 96000           Plan: Okay for proposed hip surgery Friday afternoon from cardiac standpoint.  Continue present dose of amiodarone 100 mg daily  LOS: 3 days    Cassell Clement 12/17/2011, 8:08 AM

## 2011-12-17 NOTE — Progress Notes (Signed)
Utilization Review Completed.Ricci Paff T4/12/2011   

## 2011-12-17 NOTE — Progress Notes (Signed)
Patient ID: [name], female   DOB: [birth year], 78 y.o.   MRN: [id number]    Subjective: Denies headache this morning.  No further neurologic symptoms.  Biggest complaint is left hip pain.     Objective: Vital signs in last 24 hours: Temp:  [98.1 F (36.7 C)-98.8 F (37.1 C)] 98.2 F (36.8 C) (04/04 0830) Pulse Rate:  [56-74] 58  (04/04 0800) Resp:  [10-26] 26  (04/04 0800) BP: (104-159)/(37-95) 118/38 mmHg (04/04 0800) SpO2:  [89 %-100 %] 99 % (04/04 0800)    Intake/Output this shift: Total I/O In: 390 [P.O.:240; I.V.:150] Out: 75 [Urine:75]  Physical Exam: BP 118/38  Pulse 58  Temp(Src) 98.2 F (36.8 C) (Axillary)  Resp 26  Ht 5\' 5"  (1.651 m)  Wt 161 lb 6 oz (73.2 kg)  BMI 26.85 kg/m2  SpO2 99% Alert, appropriately interactive Abd - soft, non distended, non distended.   Ext.- moving all extremities. Good size left hip hematoma.  Labs: CBC  Basename 12/17/11 0400 12/16/11 0400  WBC 5.9 8.9  HGB 9.1* 9.9*  HCT 26.3* 29.2*  PLT 96* 90*   BMET  Basename 12/17/11 0400 12/16/11 0400  NA 130* 134*  K 4.5 4.1  CL 96 96  CO2 26 28  GLUCOSE 88 102*  BUN 27* 34*  CREATININE 1.40* 2.12*  CALCIUM 8.1* 8.5   LFT  Basename 12/15/11 0342  PROT 5.7*  ALBUMIN 3.3*  AST 39*  ALT 23  ALKPHOS 40  BILITOT 0.7  BILIDIR --  IBILI --  LIPASE --   PT/INR  Basename 12/16/11 0400 12/15/11 1344  LABPROT 14.2 16.1*  INR 1.08 1.26   ABG No results found for this basename: PHART:2,PCO2:2,PO2:2,HCO3:2 in the last 72 hours  Studies/Results: Ct Head Wo Contrast  12/16/2011  *RADIOLOGY REPORT*  Clinical Data: Follow-up intracranial hemorrhage  CT HEAD WITHOUT CONTRAST  Technique:  Contiguous axial images were obtained from the base of the skull through the vertex without contrast.  Comparison: 12/15/2011  Findings: Small amount of subarachnoid hemorrhage evident within the sulci of the left hemisphere appears the same.  No evidence of increased subarachnoid bleeding.   Thin subdural hematoma along the convexity on the left is no larger.  Maximal thickness remains 5 mm.  No significant mass effect upon the brain.  No other new finding.  No parenchymal brain injury is seen.  Chronic small vessel changes are again noted throughout the [place] matter.  No hydrocephalus.  No visible skull fracture.  No traumatic fluid in the sinuses.  IMPRESSION: No change in small amount of subarachnoid hemorrhage on the left.  No increase in thin subdural hematoma along the left convexity, maximal thickness 5 mm, without demonstrable mass effect.  Original Report Authenticated By: Thomasenia Sales, M.D.    Assessment/Plan: No change in [facility] No general trauma issues at this point Will discuss transfer to medicine team. Cards has cleared for surgery tomorrow.     LOS: 3 days    Zayven Powe MD 12/17/2011 9:33 AM

## 2011-12-18 ENCOUNTER — Encounter (HOSPITAL_COMMUNITY): Payer: Self-pay | Admitting: Radiology

## 2011-12-18 ENCOUNTER — Encounter (HOSPITAL_COMMUNITY): Payer: Self-pay | Admitting: Anesthesiology

## 2011-12-18 ENCOUNTER — Encounter (HOSPITAL_COMMUNITY)
Admission: EM | Disposition: A | Discharge status: Skilled Nursing Facility | Payer: Self-pay | Source: Other Acute Inpatient Hospital | Attending: Family Medicine

## 2011-12-18 ENCOUNTER — Inpatient Hospital Stay (HOSPITAL_COMMUNITY): Payer: Medicare Other | Admitting: Anesthesiology

## 2011-12-18 ENCOUNTER — Inpatient Hospital Stay (HOSPITAL_COMMUNITY): Payer: Medicare Other

## 2011-12-18 HISTORY — PX: HIP ARTHROPLASTY: SHX981

## 2011-12-18 LAB — CBC
HCT: 25.7 % — ABNORMAL LOW (ref 36.0–46.0)
HCT: 28.8 % — ABNORMAL LOW (ref 36.0–46.0)
Hemoglobin: 8.8 g/dL — ABNORMAL LOW (ref 12.0–15.0)
Hemoglobin: 9.6 g/dL — ABNORMAL LOW (ref 12.0–15.0)
MCH: 29.4 pg (ref 26.0–34.0)
MCH: 29 pg (ref 26.0–34.0)
MCHC: 33.3 g/dL (ref 30.0–36.0)
MCV: 86 fL (ref 78.0–100.0)
RBC: 2.99 MIL/uL — ABNORMAL LOW (ref 3.87–5.11)

## 2011-12-18 LAB — BASIC METABOLIC PANEL
CO2: 26 mEq/L (ref 19–32)
Calcium: 8.3 mg/dL — ABNORMAL LOW (ref 8.4–10.5)
Glucose, Bld: 91 mg/dL (ref 70–99)
Potassium: 4.7 mEq/L (ref 3.5–5.1)
Sodium: 131 mEq/L — ABNORMAL LOW (ref 135–145)

## 2011-12-18 SURGERY — HEMIARTHROPLASTY, HIP, DIRECT ANTERIOR APPROACH, FOR FRACTURE
Anesthesia: General | Site: Hip | Laterality: Left | Wound class: Clean

## 2011-12-18 MED ORDER — METOCLOPRAMIDE HCL 5 MG/ML IJ SOLN
5.0000 mg | Freq: Three times a day (TID) | INTRAMUSCULAR | Status: DC | PRN
  Filled 2011-12-18: qty 2

## 2011-12-18 MED ORDER — OXYCODONE-ACETAMINOPHEN 5-325 MG PO TABS
1.0000 | ORAL_TABLET | ORAL | Status: DC | PRN
Start: 2011-12-18 — End: 2011-12-19

## 2011-12-18 MED ORDER — POLYETHYLENE GLYCOL 3350 17 G PO PACK
17.0000 g | PACK | Freq: Two times a day (BID) | ORAL | Status: DC
  Administered 2011-12-18 – 2011-12-21 (×7): 17 g via ORAL
  Filled 2011-12-18 (×10): qty 1

## 2011-12-18 MED ORDER — FENTANYL CITRATE 0.05 MG/ML IJ SOLN
INTRAMUSCULAR | Status: DC | PRN
  Administered 2011-12-18 (×2): 50 ug via INTRAVENOUS
  Administered 2011-12-18: 100 ug via INTRAVENOUS

## 2011-12-18 MED ORDER — METOCLOPRAMIDE HCL 5 MG PO TABS
5.0000 mg | ORAL_TABLET | Freq: Three times a day (TID) | ORAL | Status: DC | PRN
  Filled 2011-12-18: qty 2

## 2011-12-18 MED ORDER — CEFAZOLIN SODIUM 1-5 GM-% IV SOLN
1.0000 g | Freq: Four times a day (QID) | INTRAVENOUS | Status: AC
  Administered 2011-12-18 – 2011-12-19 (×3): 1 g via INTRAVENOUS
  Filled 2011-12-18 (×3): qty 50

## 2011-12-18 MED ORDER — ONDANSETRON HCL 4 MG/2ML IJ SOLN
INTRAMUSCULAR | Status: DC | PRN
  Administered 2011-12-18: 4 mg via INTRAVENOUS

## 2011-12-18 MED ORDER — ACETAMINOPHEN 10 MG/ML IV SOLN
1000.0000 mg | Freq: Once | INTRAVENOUS | Status: AC | PRN
  Filled 2011-12-18: qty 100

## 2011-12-18 MED ORDER — HYDROMORPHONE HCL PF 1 MG/ML IJ SOLN
0.5000 mg | INTRAMUSCULAR | Status: DC | PRN
  Administered 2011-12-18 – 2011-12-19 (×4): 1 mg via INTRAVENOUS
  Filled 2011-12-18 (×4): qty 1

## 2011-12-18 MED ORDER — DEXAMETHASONE SODIUM PHOSPHATE 4 MG/ML IJ SOLN
INTRAMUSCULAR | Status: DC | PRN
  Administered 2011-12-18: 4 mg via INTRAVENOUS

## 2011-12-18 MED ORDER — CHLORHEXIDINE GLUCONATE 4 % EX LIQD: 60.0000 mL | Freq: Once | CUTANEOUS | Status: DC

## 2011-12-18 MED ORDER — HYDROCODONE-ACETAMINOPHEN 5-325 MG PO TABS
1.0000 | ORAL_TABLET | ORAL | Status: DC | PRN
  Administered 2011-12-19: 2 via ORAL
  Filled 2011-12-18: qty 2

## 2011-12-18 MED ORDER — PROPOFOL 10 MG/ML IV EMUL
INTRAVENOUS | Status: DC | PRN
  Administered 2011-12-18: 140 mg via INTRAVENOUS

## 2011-12-18 MED ORDER — HYDROMORPHONE HCL PF 1 MG/ML IJ SOLN
0.2500 mg | INTRAMUSCULAR | Status: DC | PRN
  Administered 2011-12-18 (×2): 0.5 mg via INTRAVENOUS

## 2011-12-18 MED ORDER — SODIUM CHLORIDE 0.9 % IR SOLN
Status: DC | PRN
  Administered 2011-12-18: 1000 mL
  Administered 2011-12-18: 3000 mL

## 2011-12-18 MED ORDER — ONDANSETRON HCL 4 MG/2ML IJ SOLN: 4.0000 mg | Freq: Four times a day (QID) | INTRAMUSCULAR | Status: DC | PRN

## 2011-12-18 MED ORDER — EPHEDRINE SULFATE 50 MG/ML IJ SOLN
INTRAMUSCULAR | Status: DC | PRN
  Administered 2011-12-18: 10 mg via INTRAVENOUS
  Administered 2011-12-18: 5 mg via INTRAVENOUS

## 2011-12-18 MED ORDER — ONDANSETRON HCL 4 MG/2ML IJ SOLN: 4.0000 mg | Freq: Once | INTRAMUSCULAR | Status: AC | PRN

## 2011-12-18 MED ORDER — NEOSTIGMINE METHYLSULFATE 1 MG/ML IJ SOLN
INTRAMUSCULAR | Status: DC | PRN
  Administered 2011-12-18: 4 mg via INTRAVENOUS

## 2011-12-18 MED ORDER — OXYCODONE HCL 5 MG PO TABS
5.0000 mg | ORAL_TABLET | ORAL | Status: DC | PRN
  Administered 2011-12-19 – 2011-12-21 (×7): 10 mg via ORAL
  Filled 2011-12-18 (×8): qty 2

## 2011-12-18 MED ORDER — ONDANSETRON HCL 4 MG PO TABS: 4.0000 mg | ORAL_TABLET | Freq: Four times a day (QID) | ORAL | Status: DC | PRN

## 2011-12-18 MED ORDER — SODIUM CHLORIDE 0.9 % IV SOLN
INTRAVENOUS | Status: DC
  Administered 2011-12-18 – 2011-12-19 (×4): via INTRAVENOUS

## 2011-12-18 MED ORDER — SODIUM CHLORIDE 0.9 % IV SOLN: INTRAVENOUS | Status: DC

## 2011-12-18 MED ORDER — CHLORHEXIDINE GLUCONATE 4 % EX LIQD
60.0000 mL | Freq: Once | CUTANEOUS | Status: AC
  Administered 2011-12-18: 4 via TOPICAL
  Filled 2011-12-18: qty 60

## 2011-12-18 MED ORDER — HYDROMORPHONE HCL PF 1 MG/ML IJ SOLN
INTRAMUSCULAR | Status: AC
  Filled 2011-12-18: qty 1

## 2011-12-18 MED ORDER — ROCURONIUM BROMIDE 100 MG/10ML IV SOLN
INTRAVENOUS | Status: DC | PRN
  Administered 2011-12-18: 40 mg via INTRAVENOUS

## 2011-12-18 MED ORDER — HETASTARCH-ELECTROLYTES 6 % IV SOLN
INTRAVENOUS | Status: DC | PRN
  Administered 2011-12-18: 12:00:00 via INTRAVENOUS

## 2011-12-18 MED ORDER — GLYCOPYRROLATE 0.2 MG/ML IJ SOLN
INTRAMUSCULAR | Status: DC | PRN
  Administered 2011-12-18: 0.6 mg via INTRAVENOUS

## 2011-12-18 MED ORDER — ACETAMINOPHEN 325 MG PO TABS
650.0000 mg | ORAL_TABLET | Freq: Four times a day (QID) | ORAL | Status: DC | PRN
  Administered 2011-12-21 – 2011-12-22 (×2): 650 mg via ORAL
  Filled 2011-12-18 (×2): qty 2

## 2011-12-18 SURGICAL SUPPLY — 57 items
BLADE SAW SAG 73X25 THK (BLADE) ×1
BLADE SAW SGTL 73X25 THK (BLADE) ×1 IMPLANT
BRUSH FEMORAL CANAL (MISCELLANEOUS) IMPLANT
CANISTER WOUND CARE 500ML ATS (WOUND CARE) ×1 IMPLANT
CLOTH BEACON ORANGE TIMEOUT ST (SAFETY) ×2 IMPLANT
COVER BACK TABLE 24X17X13 BIG (DRAPES) ×1 IMPLANT
DRAPE INCISE IOBAN 85X60 (DRAPES) ×3 IMPLANT
DRAPE ORTHO SPLIT 77X108 STRL (DRAPES) ×4
DRAPE SURG ORHT 6 SPLT 77X108 (DRAPES) ×2 IMPLANT
DRAPE U-SHAPE 47X51 STRL (DRAPES) ×2 IMPLANT
DRILL BIT 7/64X5 (BIT) ×2 IMPLANT
DRSG MEPILEX BORDER 4X12 (GAUZE/BANDAGES/DRESSINGS) ×1 IMPLANT
DRSG PAD ABDOMINAL 8X10 ST (GAUZE/BANDAGES/DRESSINGS) ×2 IMPLANT
DRSG VAC ATS MED SENSATRAC (GAUZE/BANDAGES/DRESSINGS) ×1 IMPLANT
DURAPREP 26ML APPLICATOR (WOUND CARE) ×2 IMPLANT
ELECT BLADE 6.5 EXT (BLADE) IMPLANT
ELECT CAUTERY BLADE 6.4 (BLADE) ×1 IMPLANT
ELECT REM PT RETURN 9FT ADLT (ELECTROSURGICAL) ×2
ELECTRODE REM PT RTRN 9FT ADLT (ELECTROSURGICAL) ×1 IMPLANT
EVACUATOR 1/8 PVC DRAIN (DRAIN) IMPLANT
GLOVE BIOGEL PI IND STRL 7.0 (GLOVE) IMPLANT
GLOVE BIOGEL PI IND STRL 8 (GLOVE) IMPLANT
GLOVE BIOGEL PI IND STRL 9 (GLOVE) ×1 IMPLANT
GLOVE BIOGEL PI INDICATOR 7.0 (GLOVE) ×2
GLOVE BIOGEL PI INDICATOR 8 (GLOVE) ×2
GLOVE BIOGEL PI INDICATOR 9 (GLOVE) ×1
GLOVE SURG ORTHO 9.0 STRL STRW (GLOVE) ×2 IMPLANT
GLOVE SURG SS PI 6.5 STRL IVOR (GLOVE) ×1 IMPLANT
GLOVE SURG SS PI 7.0 STRL IVOR (GLOVE) ×1 IMPLANT
GLOVE SURG SS PI 7.5 STRL IVOR (GLOVE) ×2 IMPLANT
GOWN PREVENTION PLUS XLARGE (GOWN DISPOSABLE) ×1 IMPLANT
GOWN SRG XL XLNG 56XLVL 4 (GOWN DISPOSABLE) ×2 IMPLANT
GOWN STRL NON-REIN XL XLG LVL4 (GOWN DISPOSABLE) ×8
GOWN STRL REIN 2XL LVL4 (GOWN DISPOSABLE) ×1 IMPLANT
HANDPIECE INTERPULSE COAX TIP (DISPOSABLE) ×2
IMMOBILIZER KNEE 20 (SOFTGOODS) ×2
IMMOBILIZER KNEE 20 THIGH 36 (SOFTGOODS) IMPLANT
KIT BASIN OR (CUSTOM PROCEDURE TRAY) ×2 IMPLANT
KIT ROOM TURNOVER OR (KITS) ×2 IMPLANT
MANIFOLD NEPTUNE II (INSTRUMENTS) ×2 IMPLANT
NS IRRIG 1000ML POUR BTL (IV SOLUTION) ×2 IMPLANT
PACK TOTAL JOINT (CUSTOM PROCEDURE TRAY) ×2 IMPLANT
PAD ARMBOARD 7.5X6 YLW CONV (MISCELLANEOUS) ×4 IMPLANT
SET HNDPC FAN SPRY TIP SCT (DISPOSABLE) IMPLANT
SPONGE GAUZE 4X4 12PLY (GAUZE/BANDAGES/DRESSINGS) ×1 IMPLANT
SPONGE LAP 18X18 X RAY DECT (DISPOSABLE) ×2 IMPLANT
STAPLER VISISTAT 35W (STAPLE) ×2 IMPLANT
SUCTION FRAZIER TIP 10 FR DISP (SUCTIONS) ×2 IMPLANT
SUT ETHIBOND NAB CT1 #1 30IN (SUTURE) ×2 IMPLANT
SUT VIC AB 1 CT1 27 (SUTURE) ×6
SUT VIC AB 1 CT1 27XBRD ANBCTR (SUTURE) ×4 IMPLANT
SUT VIC AB 2-0 CTB1 (SUTURE) ×3 IMPLANT
TOWEL OR 17X24 6PK STRL BLUE (TOWEL DISPOSABLE) ×2 IMPLANT
TOWEL OR 17X26 10 PK STRL BLUE (TOWEL DISPOSABLE) ×2 IMPLANT
TOWER CARTRIDGE SMART MIX (DISPOSABLE) IMPLANT
TRAY FOLEY CATH 14FR (SET/KITS/TRAYS/PACK) IMPLANT
WATER STERILE IRR 1000ML POUR (IV SOLUTION) ×4 IMPLANT

## 2011-12-18 NOTE — Progress Notes (Addendum)
Subjective:  Patient is alert.  A little nervous about surgery today.  No chest pain or dyspnea.  Left hip feels tight/sore  Telemetry stable remains in NSR.    Objective:  Vital Signs in the last 24 hours: Temp:  [97.6 F (36.4 C)-99.2 F (37.3 C)] 98.5 F (36.9 C) (04/05 0400) Pulse Rate:  [62-76] 69  (04/05 0800) Resp:  [8-24] 17  (04/05 0800) BP: (122-159)/(30-76) 159/52 mmHg (04/05 0800) SpO2:  [78 %-100 %] 98 % (04/05 0800)  Intake/Output from previous day: 04/04 0701 - 04/05 0700 In: 2220 [P.O.:420; I.V.:1800] Out: 1850 [Urine:1850] Intake/Output from this shift: Total I/O In: 116.3 [I.V.:116.3] Out: -      . amiodarone  100 mg Oral Daily  .  ceFAZolin (ANCEF) IV  1 g Intravenous 60 min Pre-Op  . chlorhexidine  60 mL Topical Once  . chlorhexidine  60 mL Topical Once  . docusate sodium  100 mg Oral BID  . fluticasone  1 spray Each Nare Daily  . loratadine  10 mg Oral Daily  . pantoprazole  40 mg Oral Q1200  . simvastatin  20 mg Oral q1800  . DISCONTD: chlorhexidine  60 mL Topical Once      . 0.45 % NaCl with KCl 20 mEq / L 75 mL/hr at 12/18/11 9811    Physical Exam: The patient appears to be in no distress.  Complains of left hip pain.  Head and neck: pupils are equal and reactive.  The extraocular movements are full.  There is no scleral icterus.  Mouth and pharynx are benign.  No lymphadenopathy.  No carotid bruits.  The jugular venous pressure is normal.  Thyroid is not enlarged or tender. Chest is clear to percussion and auscultation.  No rales or rhonchi.  Expansion of the chest is symmetrical. Heart reveals no abnormal lift or heave.  First and second heart sounds are normal.  There is no murmur gallop rub or click. Abdomen is soft and nontender.  Bowel sounds are normoactive.  There is no hepatosplenomegaly or mass.  There are no abdominal bruits. Extremities reveal no phlebitis or edema.  Pedal pulses are good.  There is no cyanosis or  clubbing. Skin reveals no rash  Lab Results:  Basename 12/18/11 0545 12/17/11 0400  WBC 4.4 5.9  HGB 8.8* 9.1*  PLT 106* 96*    Basename 12/18/11 0545 12/17/11 0400  NA 131* 130*  K 4.7 4.5  CL 99 96  CO2 26 26  GLUCOSE 91 88  BUN 20 27*  CREATININE 1.14* 1.40*   No results found for this basename: TROPONINI:2,CK,MB:2 in the last 72 hours Hepatic Function Panel No results found for this basename: PROT,ALBUMIN,AST,ALT,ALKPHOS,BILITOT,BILIDIR,IBILI in the last 72 hours No results found for this basename: CHOL in the last 72 hours No results found for this basename: PROTIME in the last 72 hours  Imaging: Ct Head Wo Contrast  12/18/2011  *RADIOLOGY REPORT*  Clinical Data: Head injury.  Followup subdural hematoma.  CT HEAD WITHOUT CONTRAST  Technique:  Contiguous axial images were obtained from the base of the skull through the vertex without contrast.  Comparison: 12/16/2011.  Findings: Similar appearance of left-sided subdural hematoma with maximal thickness of 5.3 mm.  This extends along the posterior left falx/tentorium.  Similar appearance of amount of subarachnoid hemorrhage left convexity.  Mild global atrophy without hydrocephalus.  Small vessel disease type changes. No CT evidence of large acute infarct.  No intracranial mass lesion detected on this  unenhanced exam.  Right mastoid air cell opacification.  IMPRESSION: No significant change of left convexity subdural hematoma or amount of left-sided subarachnoid hemorrhage.  Original Report Authenticated By: Fuller Canada, M.D.    Cardiac Studies:  Assessment/Plan:  Patient Active Hospital Problem List: 1)Fracture of left hip secondary to dizziness and a fall. 2) Past history of paroxysmal atrial fib, currently in NSR 3) Past history of NSTEMI type II in Sept 2012              Had normal lexiscan 05/01/11 with EF 62% 4) Past history of diastolic HF with mild pulmonary hypertension PA pressure 46 by echo 04/30/11 5) Previous  long-term warfarin, presently reversed. INR 1.08 6) Subdural hematoma and subarachnoid hemorrhage secondary to fall. 7) Anemia stable  Hgb 9.1.  Platelets up slightly 96000 8) Acute on chronic renal failure, resolved  [name] appears stable from a cardiac perspective.  Volume status looks good.  She remains in NSR on amiodarone 100 mg daily.  In light of falls and SAH feel she should not be continued on coumadin for AF despite CHADS VASc of 6.  Can discuss this with Dr. Gala Romney, her cardiologist further.    Will proceed with surgery today.  Will need to monitor volume status closely in the post op setting with history of diastolic heart failure.      LOS: 4 days   [name], [facility] 12/18/2011, 8:30 AM  Agree with above PA note.  Patient seen post-surgery.  She is hemodynamically stable.  No new recommendations at this time.   Marca Ancona 12/18/2011 3:48 PM

## 2011-12-18 NOTE — Progress Notes (Addendum)
Pt arrived from PACU- Pt disoriented. No other neurological deficits noted. MD made aware. STAT CT order.  No changes on CT scan. MD made aware. Will continuing monitoring for now.

## 2011-12-18 NOTE — Progress Notes (Signed)
Chaplain Note:  Upon referral from pt's nurse, chaplain visited with pt's family.  Family was anxious over the fact, after waking up from anesthesia, pt was very confused, not even able recognize her name or family members.  Chaplain provided spiritual comfort, support, and prayer for family and pt.  Pt's family expressed appreciation for chaplain support.  Chaplain will follow up as needed.   12/18/11 1500  Clinical Encounter Type  Visited With Family;Health care provider  Visit Type Initial;Spiritual support;Critical Care  Referral From Nurse  Spiritual Encounters  Spiritual Needs Prayer;Emotional  Stress Factors  Patient Stress Factors Loss of control;Health changes;Other (Comment) (Pt having difficulty with memory post anesthesia)    Verdie Shire, chaplain resident [phone]

## 2011-12-18 NOTE — Op Note (Signed)
OPERATIVE REPORT  DATE OF SURGERY: 12/18/2011  PATIENT:  [name],  78 y.o. female  PRE-OPERATIVE DIAGNOSIS:  Left Femoral Neck Fracture  POST-OPERATIVE DIAGNOSIS:  Left Femoral Neck Fracture  PROCEDURE:  Procedure(s): ARTHROPLASTY BIPOLAR HIP  SURGEON:  Surgeon(s): Nadara Mustard, MD  ANESTHESIA:   general  EBL:  Minimal ML  SPECIMEN:  No Specimen  TOURNIQUET:  * No tourniquets in log *  PROCEDURE DETAILS: Patient is an 78 year old woman who had a fall at home sustained at femoral neck  fracture as well as bleeding from her anti-coagulation therapy. Patient has been stabilized and presents at this time for left hip hemiarthroplasty. Risks and benefits were discussed including infection neurovascular injury persistent pain and DVT mortality, need for additional surgery risk for dislocation. This was discussed with the patient and her daughter they state they understand and wish to proceed at this time. Description procedure patient brought to OR room time and underwent a general anesthetic. After adequate levels of anesthesia were obtained patient was placed in the right lateral decubitus position the left side up and the left lower extremity was prepped using DuraPrep and draped into a sterile field. There was a significant amount of swelling and large hematoma. A posterior lateral incision was made anterior to the bruising of the skin. There was a large hematoma and necrotic fat tissue that was extensively debrided and irrigated with pulsatile lavage. The tensor fascial lata was then split the femoral neck cut was made 1 cm proximal to the calcar the head was delivered the head was sized as  45 mm in diameter and this provided good stable suctioned fit. These femoral canal was then sequentially broached to a 13. The wounds again irrigated with pulsatile lavage the size 13 stem +0 neck and 45 mm head with bipolar head were inserted. Patient had good stable range of motion. The wound was  again irrigated with pulsatile lavage. The tensor fascial lata was closed using #1 Vicryl the subcutaneous was closed in 2-0 Vicryl the skin was closed using proximate staples. The wound was covered with a wound VAC. This was sent to 75 mm of suction. This was done to further protect the soft tissue and further  evacuate any residual hematoma. A head pillow placed between her legs in the immobilizer with the metal removed was placed patient was extubated taken to the PACU in stable condition.  PLAN OF CARE: Admit to inpatient   PATIENT DISPOSITION:  PACU - hemodynamically stable.   Nadara Mustard, MD 12/18/2011 12:28 PM

## 2011-12-18 NOTE — Progress Notes (Signed)
Subjective:  Patient resting comfortably in bed, without complaints. Scheduled for repair of hip fracture today. CT this morning shows no change to slight decrease in thin left hemispheric subdural hematoma.  Objective: Vital signs in last 24 hours: Filed Vitals:   12/18/11 0400 12/18/11 0500 12/18/11 0600 12/18/11 0700  BP: 158/69 148/54 155/57 151/56  Pulse: 76 69 72 63  Temp: 98.5 F (36.9 C)     TempSrc: Oral     Resp: 19 13 18 16   Height:      Weight:      SpO2: 97% 96% 97% 94%    Intake/Output from previous day: 04/04 0701 - 04/05 0700 In: 2220 [P.O.:420; I.V.:1800] Out: 1850 [Urine:1850] Intake/Output this shift: Total I/O In: 116.3 [I.V.:116.3] Out: -   Physical Exam:  Awake, alert, oriented to name, [place], and April, but not to year. Pupils equal round react to light. Extraocular movements intact. Facial movement symmetrical. Moving all 4 extremities well (although movement of left lower from is limited to the pain from left fracture). No drift of upper extremities.  CBC  Basename 12/18/11 0545 12/17/11 0400  WBC 4.4 5.9  HGB 8.8* 9.1*  HCT 25.7* 26.3*  PLT 106* 96*   BMET  Basename 12/18/11 0545 12/17/11 0400  NA 131* 130*  K 4.7 4.5  CL 99 96  CO2 26 26  GLUCOSE 91 88  BUN 20 27*  CREATININE 1.14* 1.40*  CALCIUM 8.3* 8.1*    Studies/Results: Ct Head Wo Contrast  12/18/2011  *RADIOLOGY REPORT*  Clinical Data: Head injury.  Followup subdural hematoma.  CT HEAD WITHOUT CONTRAST  Technique:  Contiguous axial images were obtained from the base of the skull through the vertex without contrast.  Comparison: 12/16/2011.  Findings: Similar appearance of left-sided subdural hematoma with maximal thickness of 5.3 mm.  This extends along the posterior left falx/tentorium.  Similar appearance of amount of subarachnoid hemorrhage left convexity.  Mild global atrophy without hydrocephalus.  Small vessel disease type changes. No CT evidence of large acute  infarct.  No intracranial mass lesion detected on this unenhanced exam.  Right mastoid air cell opacification.  IMPRESSION: No significant change of left convexity subdural hematoma or amount of left-sided subarachnoid hemorrhage.  Original Report Authenticated By: Fuller Canada, M.D.    Assessment/Plan: Stable from a neurosurgical perspective. We'll recheck CT brain without next week. No anticoagulation or antiplatelet agents in the setting of intracranial hemorrhage, multiple falls, and thrombocytopenia.   Hewitt Shorts, MD 12/18/2011, 8:01 AM

## 2011-12-18 NOTE — Progress Notes (Signed)
[facility]   TEAM 1 - Stepdown/ICU TEAM  Subjective: 85yoF with h/o AFib/Coumadin, diastolic HF (EF 60%), CKD stage III, NSTEMI 04/2011 medically  treated, h/o colon/kidney/ovarian cancer s/p surgery presents as a transfer from [facility] with left hip fracture, and left SAH. She was originally admitted to the Trauma Service, and Cardiology, NS, and Orthopedics are following as consults.  [facility] assumed care of the pt on 12/17/2011.  The patient was somewhat agitated last night, but is resting comfortably this morning.  She is alert and conversant.  She c/o ongoing pain in her hip, but otherwise denies f/c, sob, n/v, cp, or abdom pain.  She reports that she has not had a bowel movement in "a few days."  Objective: Weight change:   Intake/Output Summary (Last 24 hours) at 12/18/11 0819 Last data filed at 12/18/11 0733  Gross per 24 hour  Intake 1946.25 ml  Output   1775 ml  Net 171.25 ml   Blood pressure 159/52, pulse 69, temperature 98.5 F (36.9 C), temperature source Oral, resp. rate 17, height 5\' 5"  (1.651 m), weight 73.2 kg (161 lb 6 oz), SpO2 98.00%.  Physical Exam: General: No acute respiratory distress Lungs: Clear to auscultation bilaterally without wheezes or crackles Cardiovascular: Regular rate and rhythm without murmur gallop or rub  Abdomen: Nontender, nondistended, soft, bowel sounds positive, no rebound, no ascites, no appreciable mass Extremities: No significant cyanosis, clubbing, or edema bilateral lower extremities  Lab Results:  Basename 12/18/11 0545 12/17/11 0400 12/16/11 0400  NA 131* 130* 134*  K 4.7 4.5 4.1  CL 99 96 96  CO2 26 26 28   GLUCOSE 91 88 102*  BUN 20 27* 34*  CREATININE 1.14* 1.40* 2.12*  CALCIUM 8.3* 8.1* 8.5  MG -- -- --  PHOS -- -- --    Basename 12/18/11 0545 12/17/11 0400 12/16/11 0400  WBC 4.4 5.9 8.9  NEUTROABS -- -- --  HGB 8.8* 9.1* 9.9*  HCT 25.7* 26.3* 29.2*  MCV 86.0 84.8 84.4  PLT 106* 96* 90*    Micro Results: Recent Results (from the past 240 hour(s))  MRSA PCR SCREENING     Status: Normal   Collection Time   12/14/11  7:26 PM      Component Value Range Status Comment   MRSA by PCR NEGATIVE  NEGATIVE  Final    Studies/Results: All recent x-ray/radiology reports have been reviewed in detail.   Medications: I have reviewed the patient's complete medication list.  Impression/Plan:  Paroxsymal  Atrial fibrillation  [facility] is following - no longer a coumadin candidate due to frequent falls and acute SDH - rate controlled/NSR to ascultation today  history of NSTEMI type II in Sept 2012 As per Cards consult   Diastolic CHF Volume appears to be reasonably balanced at present - follow Is/Os   HTN  BP fluctuating - care w/ anti-HTN meds - follow trend  Left femoral neck fracture  For left hip hemiarthroplasty today - Ortho following   Traumatic Subdural hematoma, subarachnoid hemorrhage  Stable - NS following  Acquired Coagulapathy due to Coumadin  Given FFP and vitamin K -  reversed fully   Anemia- posthemorrhagic  Hgb improved as expected s/p 2U PRBC - Hgb holding steady at this time  CKD 3  Cr has improved w/ gentle hydration - follow trend   Bradycardia  Improved with decrease in amio dose  thrombocytopenia Plt count is improving   Dispo Patient is medically stable for transfer out  of ICU setting - placement post-op will depend on course in OR  Lonia Blood, MD [facility] Office  [phone] Pager [phone]  On-Call/Text Page:      [email or web address]      password [facility]

## 2011-12-18 NOTE — Anesthesia Preprocedure Evaluation (Addendum)
Anesthesia Evaluation  Patient identified by MRN, date of birth, ID band Patient awake    Reviewed: Allergy & Precautions, H&P , NPO status , Patient's Chart, lab work & pertinent test results  Airway Mallampati: II TM Distance: >3 FB Neck ROM: Full    Dental  (+) Dental Advisory Given, Poor Dentition and Edentulous Lower   Pulmonary sleep apnea ,  breath sounds clear to auscultation        Cardiovascular hypertension, + Past MI and +CHF + dysrhythmias Atrial Fibrillation Rhythm:Regular     Neuro/Psych Subdural hematoma from fall. Pt. Alert and oriented to self and place.    GI/Hepatic   Endo/Other    Renal/GU Renal InsufficiencyRenal disease     Musculoskeletal   Abdominal   Peds  Hematology   Anesthesia Other Findings   Reproductive/Obstetrics                       Anesthesia Physical Anesthesia Plan  ASA: III  Anesthesia Plan: General   Post-op Pain Management:    Induction: Intravenous  Airway Management Planned: Oral ETT  Additional Equipment:   Intra-op Plan:   Post-operative Plan: Extubation in OR  Informed Consent: I have reviewed the patients History and Physical, chart, labs and discussed the procedure including the risks, benefits and alternatives for the proposed anesthesia with the patient or authorized representative who has indicated his/her understanding and acceptance.   Dental advisory given  Plan Discussed with: CRNA, Surgeon and Anesthesiologist  Anesthesia Plan Comments:         Anesthesia Quick Evaluation

## 2011-12-18 NOTE — Preoperative (Signed)
Beta Blockers   Reason not to administer Beta Blockers:Not Applicable 

## 2011-12-18 NOTE — Anesthesia Postprocedure Evaluation (Signed)
  Anesthesia Post-op Note  Patient: [name]  Procedure(s) Performed: Procedure(s) (LRB): ARTHROPLASTY BIPOLAR HIP (Left)  Patient Location: PACU  Anesthesia Type: General  Level of Consciousness: awake, alert  and oriented  Airway and Oxygen Therapy: Patient Spontanous Breathing and Patient connected to nasal cannula oxygen  Post-op Pain: mild  Post-op Assessment: Post-op Vital signs reviewed and Patient's Cardiovascular Status Stable  Post-op Vital Signs: stable  Complications: No apparent anesthesia complications

## 2011-12-18 NOTE — Progress Notes (Signed)
Physical medicine rehabilitation consult salt requested on 78 year old female with terminal neck fracture underwent bipolar hip arthroplasty this afternoon April 5. Patient currently is unable to participate with exam. Will await physical and occupational therapy evaluations to be completed. Patient will will have to be aware purchase up a with exam before completing formal rehabilitation consult. Will followup once patient out of bed with physical therapy

## 2011-12-18 NOTE — Progress Notes (Signed)
.  Clinical social worker continuing to follow pt to assist with pt dc plans and further csw needs.  CSW will submit updated clinicals over weekend/monday to updated snf for rehab if pt is not eligible for CIR.   Catha Gosselin, Theresia Majors  [phone] .12/18/2011 1814pm

## 2011-12-18 NOTE — Anesthesia Procedure Notes (Signed)
Procedure Name: Intubation Date/Time: 12/18/2011 11:18 AM Performed by: Elon Alas Pre-anesthesia Checklist: Patient identified, Timeout performed, Emergency Drugs available, Suction available and Patient being monitored Patient Re-evaluated:Patient Re-evaluated prior to inductionOxygen Delivery Method: Circle system utilized Preoxygenation: Pre-oxygenation with 100% oxygen Intubation Type: IV induction and Cricoid Pressure applied Ventilation: Mask ventilation without difficulty Laryngoscope Size: Mac and 3 Grade View: Grade I Tube type: Oral Tube size: 7.0 mm Number of attempts: 1 Airway Equipment and Method: Stylet Placement Confirmation: positive ETCO2,  ETT inserted through vocal cords under direct vision and breath sounds checked- equal and bilateral Secured at: 21 cm Tube secured with: Tape Dental Injury: Teeth and Oropharynx as per pre-operative assessment

## 2011-12-18 NOTE — Transfer of Care (Signed)
Immediate Anesthesia Transfer of Care Note  Patient: [name]  Procedure(s) Performed: Procedure(s) (LRB): ARTHROPLASTY BIPOLAR HIP (Left)  Patient Location: PACU  Anesthesia Type: General  Level of Consciousness: awake and alert   Airway & Oxygen Therapy: Patient Spontanous Breathing and Patient connected to nasal cannula oxygen  Post-op Assessment: Report given to PACU RN, Post -op Vital signs reviewed and stable  Post vital signs: Reviewed and stable  Complications: No apparent anesthesia complications

## 2011-12-19 LAB — TYPE AND SCREEN
ABO/RH(D): O POS
Antibody Screen: NEGATIVE
Unit division: 0
Unit division: 0
Unit division: 0
Unit division: 0

## 2011-12-19 LAB — BASIC METABOLIC PANEL
Chloride: 100 mEq/L (ref 96–112)
GFR calc Af Amer: 57 mL/min — ABNORMAL LOW (ref >90)
GFR calc non Af Amer: 49 mL/min — ABNORMAL LOW (ref >90)
Potassium: 4.9 mEq/L (ref 3.5–5.1)
Sodium: 130 mEq/L — ABNORMAL LOW (ref 135–145)

## 2011-12-19 LAB — CBC
HCT: 22.1 % — ABNORMAL LOW (ref 36.0–46.0)
MCHC: 33 g/dL (ref 30.0–36.0)
Platelets: UNDETERMINED 10*3/uL (ref 150–400)
RDW: 15.2 % (ref 11.5–15.5)
WBC: 5.8 10*3/uL (ref 4.0–10.5)

## 2011-12-19 LAB — PREPARE RBC (CROSSMATCH)

## 2011-12-19 MED ORDER — ISOSORBIDE MONONITRATE ER 30 MG PO TB24
30.0000 mg | ORAL_TABLET | Freq: Every day | ORAL | Status: DC
  Administered 2011-12-19 – 2011-12-22 (×4): 30 mg via ORAL
  Filled 2011-12-19 (×4): qty 1

## 2011-12-19 NOTE — Evaluation (Signed)
Physical Therapy Evaluation Patient Details Name: [name] MRN: [id number] DOB: October 18, 1926 Today's Date: 12/19/2011  Problem List:  Patient Active Problem List  Diagnoses  . Paroxysmal atrial fibrillation  . Chest pain  . OSA (obstructive sleep apnea)  . Encounter for long-term (current) use of anticoagulants  . Chronic diastolic heart failure  . Back pain  . NSTEMI (non-ST elevated myocardial infarction)  . Dysuria  . Dizziness  . CKD (chronic kidney disease)  . Cough  . Fatigue  . Acute on chronic diastolic heart failure  . Hypoxia  . Nausea  . Hematuria  . Chronic sinusitis  . Traumatic subdural hematoma  . Fracture of femoral neck, closed  . Acute blood loss anemia    Past Medical History:  Past Medical History  Diagnosis Date  . Thrombocytopenia   . Sleep related leg cramps   . Unspecified hearing loss   . HTN (hypertension)   . Lumbar pain   . Osteopenia   . CKD (chronic kidney disease), stage III   . Chronic constipation   . Unspecified urinary incontinence   . Hyponatremia   . Mitral valve disorders   . Disturbance of skin sensation   . Sciatica   . Unspecified cataract   . Lumbosacral spondylosis without myelopathy   . Edema   . HLD (hyperlipidemia)   . Other B-complex deficiencies   . Unspecified vitamin D deficiency   . Colon cancer 1961  . Ovarian cancer 1970  . History of kidney cancer 2005  . Atrial fibrillation     amiodarone; coumadin  . NSTEMI (non-ST elevated myocardial infarction)     in setting of hypoxic resp failure 9/12: ? type 2 NSTEMI vs. true ischemia (med therapy chosen)  . Chronic diastolic heart failure     Echo 8/12: Moderate LVH, EF 55-60%, mild MR, mild BAE, PASP 60. Myoview 8/12: Negative for ischemia, normal LV function.   . DNR (do not resuscitate)   . Morbid obesity    Past Surgical History:  Past Surgical History  Procedure Date  . Appendectomy 1945  . Ankle surgery     Pin placed and later removed  . 2-d  echocardiogram 04/30/2011  . Negative myoview stress test 05/07/11    for ischemia or infarction. EF of 62%    PT Assessment/Plan/Recommendation PT Assessment Clinical Impression Statement: Pt presents with a medical diagnosis of SDH with hip fx s/p bipolar arthroplasty left. Pt with decreased activity prior to fall limiting endurance and strength. Pt will benefit from skilled PT in the acute care setting in order to maximize functional mobility and increase strength prior to d/c PT Recommendation/Assessment: Patient will need skilled PT in the acute care venue PT Problem List: Decreased strength;Decreased range of motion;Decreased activity tolerance;Decreased balance;Decreased mobility;Decreased knowledge of precautions;Decreased knowledge of use of DME;Pain PT Therapy Diagnosis : Abnormality of gait;Acute pain;Generalized weakness PT Plan PT Frequency: Min 6X/week PT Treatment/Interventions: DME instruction;Gait training;Functional mobility training;Therapeutic activities;Therapeutic exercise;Balance training;Patient/family education PT Recommendation Follow Up Recommendations: Skilled nursing facility Equipment Recommended: Defer to next venue PT Goals  Acute Rehab PT Goals PT Goal Formulation: With patient/family Time For Goal Achievement: 2 weeks Pt will go Sit to Supine/Side: with mod assist PT Goal: Sit to Supine/Side - Progress: Goal set today Pt will go Sit to Stand: with mod assist PT Goal: Sit to Stand - Progress: Goal set today Pt will go Stand to Sit: with mod assist PT Goal: Stand to Sit - Progress: Goal set today Pt will  Transfer Bed to Chair/Chair to Bed: with mod assist PT Transfer Goal: Bed to Chair/Chair to Bed - Progress: Goal set today Pt will Ambulate: 16 - 50 feet;with mod assist;with rolling walker PT Goal: Ambulate - Progress: Goal set today Pt will Perform Home Exercise Program: Independently PT Goal: Perform Home Exercise Program - Progress: Goal set  today  PT Evaluation Precautions/Restrictions  Precautions Precautions: Posterior Hip Precaution Booklet Issued: Yes (comment) Required Braces or Orthoses: No Restrictions Weight Bearing Restrictions: Yes LLE Weight Bearing: Weight bearing as tolerated Prior Functioning  Home Living Lives With: Daughter Receives Help From: Family Type of Home: House Home Layout: One level Home Access: Level entry Bathroom Shower/Tub: Walk-in shower;Door [employer] Toilet: Handicapped height Bathroom Accessibility: Yes How Accessible: Accessible via walker Home Adaptive Equipment: Shower chair with back;Walker - rolling;Straight cane Prior Function Level of Independence: Independent with gait;Independent with transfers;Needs assistance with ADLs Bath: Moderate Able to Take Stairs?: No Driving: No Vocation: Retired Producer, television/film/video: Awake/alert Overall Cognitive Status: Appears within functional limits for tasks assessed Orientation Level: Oriented to person;Oriented to situation;Oriented to time Sensation/Coordination Sensation Light Touch: Appears Intact Extremity Assessment RLE Assessment RLE Assessment: Within Functional Limits LLE Assessment LLE Assessment: Exceptions to WFL LLE AROM (degrees) Overall AROM Left Lower Extremity: Deficits;Due to precautions;Due to pain LLE Overall AROM Comments: Ankle limited by pain, Knee WFL LLE Strength LLE Overall Strength: Unable to assess;Due to precautions;Due to pain Mobility (including Balance) Bed Mobility Bed Mobility: Yes Sit to Supine: HOB elevated (comment degrees);1: +2 Total assist;Patient percentage (comment) (Pt 20%) Sit to Supine - Details (indicate cue type and reason): VC for sequencing. Pt able to move RLE, assist with LLE as well as assist through trunk. Transfers Transfers: Yes Sit to Stand: 1: +2 Total assist;Patient percentage (comment);With upper extremity assist;From chair/3-in-1 (Pt 10%) Sit to  Stand Details (indicate cue type and reason): VC for hand placement and sequencing. Assist for anterior translation as well as maintaining hip precautions during stand. Stand to Sit: 2: Max assist;With upper extremity assist;To chair/3-in-1 Stand to Sit Details: VC for hand placement. Assist to control descent Stand Pivot Transfers: 1: +2 Total assist;Patient percentage (comment) (Pt 10%) Stand Pivot Transfer Details (indicate cue type and reason): Pt able to help minimally during transfer secondary to pain. VC throughout to maintain hip precautions. Tactile cues through pelvis and trunk to assist with transfer Ambulation/Gait Ambulation/Gait: No Stairs: No Wheelchair Mobility Wheelchair Mobility: No  Posture/Postural Control Posture/Postural Control: No significant limitations Balance Balance Assessed: No Exercise  Total Joint Exercises Ankle Circles/Pumps: AROM;Strengthening;Both;10 reps;Supine Quad Sets: AROM;Strengthening;Left;10 reps;Supine End of Session PT - End of Session Equipment Utilized During Treatment: Gait belt;Left knee immobilizer Activity Tolerance: Patient limited by pain;Patient limited by fatigue Patient left: in chair;with call bell in reach;with family/visitor present Nurse Communication: Mobility status for transfers;Mobility status for ambulation General Behavior During Session: [facility] for tasks performed Cognition: Impaired Cognitive Impairment: Slow to process information with difficulty problem solving.  Limited by fatigue.  Milana Kidney 12/19/2011, 4:56 PM  12/19/2011 Milana Kidney DPT PAGER: [phone] OFFICE: [phone]

## 2011-12-19 NOTE — Progress Notes (Signed)
Patient ID: [name], female   DOB: 1926/10/18, 78 y.o.   MRN: [id number]   Patient is stable from cardiac standpoint, remains in NSR.  Will see again Monday unless called by primary team to reassess.  Marca Ancona 12/19/2011 1:07 PM

## 2011-12-19 NOTE — Progress Notes (Addendum)
[facility]  Ages TEAM 1 - Stepdown/ICU TEAM  Subjective: 85yoF with h/o AFib/Coumadin, diastolic HF (EF 60%), CKD stage III, NSTEMI 04/2011 medically treated, h/o colon/kidney/ovarian cancer s/p surgery presents as a transfer from [facility] with left hip fracture, and left SAH. She was originally admitted to the Trauma Service, and Cardiology, NS, and Orthopedics are following as consults.  [facility] assumed care of the pt on 12/17/2011.  The patient was confused yesterday after waking from surgery.  She appears to be more alert and oriented today.  She denies f/c, sob, n/v, or abdom pain.  She doe report some intermittent "falling asleep" in both feet, but denies loss of strength or sensation.    Objective: Weight change:   Intake/Output Summary (Last 24 hours) at 12/19/11 1117 Last data filed at 12/19/11 1100  Gross per 24 hour  Intake 2045.83 ml  Output   1450 ml  Net 595.83 ml   Blood pressure 133/47, pulse 70, temperature 97.4 F (36.3 C), temperature source Oral, resp. rate 14, height 5\' 5"  (1.651 m), weight 73.2 kg (161 lb 6 oz), SpO2 95.00%.  Physical Exam: General: No acute respiratory distress Lungs: Clear to auscultation bilaterally without wheezes or crackles Cardiovascular: Regular rate and rhythm without murmur gallop or rub  Abdomen: Nontender, nondistended, soft, bowel sounds positive, no rebound, no ascites, no appreciable mass Extremities: No significant cyanosis, clubbing, or edema bilateral lower extremities - L LE in immobilizer  Lab Results:  Basename 12/19/11 0600 12/18/11 0545 12/17/11 0400  NA 130* 131* 130*  K 4.9 4.7 4.5  CL 100 99 96  CO2 25 26 26   GLUCOSE 106* 91 88  BUN 17 20 27*  CREATININE 1.02 1.14* 1.40*  CALCIUM 8.1* 8.3* 8.1*  MG -- -- --  PHOS -- -- --    Basename 12/19/11 0600 12/18/11 0900 12/18/11 0545  WBC 5.8 5.6 4.4  NEUTROABS -- -- --  HGB 7.3* 9.6* 8.8*  HCT 22.1* 28.8* 25.7*  MCV 88.0 87.0 86.0  PLT PLATELET  CLUMPS NOTED ON SMEAR, UNABLE TO ESTIMATE PLATELET CLUMPING, SUGGEST RECOLLECTION OF SAMPLE IN CITRATE TUBE. 106*   Micro Results: Recent Results (from the past 240 hour(s))  MRSA PCR SCREENING     Status: Normal   Collection Time   12/14/11  7:26 PM      Component Value Range Status Comment   MRSA by PCR NEGATIVE  NEGATIVE  Final    Studies/Results: All recent x-ray/radiology reports have been reviewed in detail.   Medications: I have reviewed the patient's complete medication list.  Impression/Plan:  Paroxsymal  Atrial fibrillation  [facility] is following - no longer a coumadin candidate due to frequent falls and acute SDH - rate controlled/NSR to ascultation today  history of NSTEMI type II in Sept 2012 As per Cards consult - will strive to keep Hgb >/= 8.0  Diastolic CHF Volume appears to be reasonably balanced at present - follow Is/Os   Mild hyponatremia Follow trend - is stable   HTN  BP stabilizing - follow trend   Left femoral neck fracture  Now s/p L hip hemiarthoplasty - Ortho following - begin PT/OT as per Ortho   Traumatic Subdural hematoma, subarachnoid hemorrhage  Stable - NS following  Acquired Coagulapathy due to Coumadin  Given FFP and vitamin K -  reversed fully   Anemia- posthemorrhagic  Hgb has dropped again post-op - will transfuse an additional unit of PRBC w/ goal to keep Hgb >/= 8.0 given  her hx of known CAD and recent MI  CKD 3  Cr has improved w/ gentle hydration - follow trend   Bradycardia  resolved with decrease in amio dose  thrombocytopenia Plt count is improving   DVT prophy Must avoid coumadin/lovenox/heparin/etc due to ICH - SCDs and TED hose only option  Dispo Patient is medically stable for transfer out of ICU setting - transfer to 5000 - the ultimate goal is for placement within inpt rehab   Lonia Blood, MD [facility] Office  [phone] Pager [phone]  On-Call/Text Page:      [email or web address]       password [facility]

## 2011-12-19 NOTE — Progress Notes (Signed)
Physical Therapy Treatment Patient Details Name: [name] MRN: [id number] DOB: [birth year] Today's Date: 12/19/2011  PT Assessment/Plan  PT - Assessment/Plan Comments on Treatment Session: Pt admitted s/p left hip fracture and was able to tolerate mobility this pm.  Pt limited by fatigue, but motivated to progress.  Increased assistance required this pm due to fatigue. PT Plan: Discharge plan remains appropriate;Frequency remains appropriate See PT evaluation dated 12/19/11 for PT frequency, PT d/c recommendations, and PT equipment recommendations. PT Goals  Acute Rehab PT Goals PT Goal: Sit to Supine/Side - Progress: Progressing toward goal PT Goal: Sit to Stand - Progress: Progressing toward goal PT Goal: Stand to Sit - Progress: Progressing toward goal PT Transfer Goal: Bed to Chair/Chair to Bed - Progress: Progressing toward goal  PT Treatment Precautions/Restrictions  Precautions Precautions: Posterior Hip Precaution Booklet Issued: No Required Braces or Orthoses: No Restrictions Weight Bearing Restrictions: Yes LLE Weight Bearing: Weight bearing as tolerated Mobility (including Balance) Bed Mobility Bed Mobility: Yes Sit to Supine: 1: +2 Total assist;Patient percentage (comment);HOB flat ((pt=20%)) Sit to Supine - Details (indicate cue type and reason): Assist to slow trunk descent to bed and to facilitate motion of bilateral LEs onto bed.  Cues for sequence. Transfers Transfers: Yes Sit to Stand: 1: +2 Total assist;Patient percentage (comment);With upper extremity assist;From chair/3-in-1 ((pt=15%)) Sit to Stand Details (indicate cue type and reason): Assist to facilitate translation of trunk anterior over BOS with blocking to right knee.  Cues to extend bilateral LEs at hips/knees for stance. Stand to Sit: 1: +2 Total assist;Patient percentage (comment);With upper extremity assist;To bed ((pt=15%)) Stand to Sit Details: Assist to slow descent of sacrum onto bed with cues for  sequence.  Continued blocking to right LE to slow eccentric quadricep contraction and prevent buckling. Stand Pivot Transfers: 1: +2 Total assist;Patient percentage (comment) ((pt=15%)) Stand Pivot Transfer Details (indicate cue type and reason): Assist to facilitate extension of trunk/bilateral LEs as well as rotation of trunk to bed with cues for sequence.  Pt limited by fatigue. Ambulation/Gait Ambulation/Gait: No Stairs: No Wheelchair Mobility Wheelchair Mobility: No  Posture/Postural Control Posture/Postural Control: No significant limitations Balance Balance Assessed: No End of Session PT - End of Session Equipment Utilized During Treatment: Gait belt;Left knee immobilizer Activity Tolerance: Patient tolerated treatment well;Patient limited by fatigue Patient left: in bed;with call bell in reach (With RN) Nurse Communication: Mobility status for transfers;Weight bearing status General Behavior During Session: [facility] for tasks performed Cognition: Impaired Cognitive Impairment: Slow to process information with difficulty problem solving.  Limited by fatigue.  Orbin Mayeux M 12/19/2011, 3:10 PM  12/19/2011 Cephus Shelling, PT, DPT [phone]

## 2011-12-19 NOTE — Progress Notes (Signed)
Overall doing well. Tolerated her hip surgery without difficulty. Denies headache.  Afebrile with stable vitals. Patient awake and aware. She is oriented and appropriate. Motor and sensory function extremities is intact.  Patient with a small convexity posttraumatic subdural hematoma. No evidence of worsening. Patient free to be mobilized from our standpoint.

## 2011-12-20 LAB — CBC
HCT: 26.5 % — ABNORMAL LOW (ref 36.0–46.0)
MCH: 29.3 pg (ref 26.0–34.0)
MCV: 87.2 fL (ref 78.0–100.0)
RBC: 3.04 MIL/uL — ABNORMAL LOW (ref 3.87–5.11)
RDW: 14.8 % (ref 11.5–15.5)
WBC: 6.4 10*3/uL (ref 4.0–10.5)

## 2011-12-20 LAB — BASIC METABOLIC PANEL
CO2: 23 mEq/L (ref 19–32)
Calcium: 8.2 mg/dL — ABNORMAL LOW (ref 8.4–10.5)
Chloride: 96 mEq/L (ref 96–112)
Creatinine, Ser: 0.92 mg/dL (ref 0.50–1.10)
Glucose, Bld: 87 mg/dL (ref 70–99)

## 2011-12-20 MED ORDER — WHITE PETROLATUM GEL
Status: AC
  Filled 2011-12-20: qty 5

## 2011-12-20 NOTE — Progress Notes (Signed)
No change in status. Overall doing well. Minimal headache. No neurologic symptoms.  Stable. No new recommendations.

## 2011-12-20 NOTE — Progress Notes (Signed)
PROGRESS NOTE  [name] MHD:[id number] DOB: 1927/04/24 DOA: 12/14/2011 PCP: Ailene Ravel, MD, MD Cardiologist: Arvilla Meres, M.D. (Heart failure clinic) Electrophysiologist: Lewayne Bunting, M.D.  Brief narrative: 78 year old woman transferred from [facility]. status post fall with traumatic subarachnoid hemorrhage, left hip fracture. Admitted by the trauma service.  Chart review: 11/19/2011-11/22/2011 Hospitalization: Acute/chronic diastolic congestive heart failure  Past medical history: Atrial fibrillation, diastolic congestive heart, chronic kidney disease stage III, non-ST elevation MI,:/Kidney/ovarian cancer status post surgery  Consultants:  Admitted by the trauma service  Neurosurgery  Cardiology  Orthopedics  Physical therapy: Skilled nursing facility  Procedures:  April 5: Left hip arthroplasty  Interim History: Chart reviewed in detail. Afebrile, vital signs stable.  Subjective: No complaints.  Objective: Filed Vitals:   12/19/11 1714 12/19/11 1917 12/19/11 2111 12/20/11 0644  BP: 174/72 183/86 171/64 159/72  Pulse: 82 80 78 86  Temp: 99 F (37.2 C) 98.4 F (36.9 C) 98.5 F (36.9 C) 98.4 F (36.9 C)  TempSrc: Oral Oral    Resp: 20 20 18 18   Height:      Weight:      SpO2:   94% 95%    Intake/Output Summary (Last 24 hours) at 12/20/11 0943 Last data filed at 12/20/11 0700  Gross per 24 hour  Intake   1040 ml  Output   2225 ml  Net  -1185 ml    Exam:   General:  Appears calm and comfortable.  Cardiovascular: Regular rate and rhythm. No murmur, rub, gallop. No lower extremity edema.  Respiratory: Clear to auscultation bilaterally. No wheezes, rales, rhonchi. Normal respiratory effort.  Psychiatric: Grossly normal mood and affect. Speech fluent and appropriate.  Data Reviewed: Basic Metabolic Panel:  Lab 12/20/11 2119 12/19/11 0600 12/18/11 0545 12/17/11 0400 12/16/11 0400  NA 128* 130* 131* 130* 134*  K 4.4 4.9 -- -- --    CL 96 100 99 96 96  CO2 23 25 26 26 28   GLUCOSE 87 106* 91 88 102*  BUN 13 17 20  27* 34*  CREATININE 0.92 1.02 1.14* 1.40* 2.12*  CALCIUM 8.2* 8.1* 8.3* 8.1* 8.5  MG -- -- -- -- --  PHOS -- -- -- -- --   Liver Function Tests:  Lab 12/15/11 0342  AST 39*  ALT 23  ALKPHOS 40  BILITOT 0.7  PROT 5.7*  ALBUMIN 3.3*   CBC:  Lab 12/20/11 0500 12/19/11 0600 12/18/11 0900 12/18/11 0545 12/17/11 0400  WBC 6.4 5.8 5.6 4.4 5.9  NEUTROABS -- -- -- -- --  HGB 8.9* 7.3* 9.6* 8.8* 9.1*  HCT 26.5* 22.1* 28.8* 25.7* 26.3*  MCV 87.2 88.0 87.0 86.0 84.8  PLT 69* PLATELET CLUMPS NOTED ON SMEAR, UNABLE TO ESTIMATE PLATELET CLUMPING, SUGGEST RECOLLECTION OF SAMPLE IN CITRATE TUBE. 106* 96*   Recent Results (from the past 240 hour(s))  MRSA PCR SCREENING     Status: Normal   Collection Time   12/14/11  7:26 PM      Component Value Range Status Comment   MRSA by PCR NEGATIVE  NEGATIVE  Final      Studies: Ct Head Wo Contrast  12/18/2011  *RADIOLOGY REPORT*  Clinical Data: Altered mental status.  Question change in appearance of intracranial hemorrhage.  CT HEAD WITHOUT CONTRAST  Technique:  Contiguous axial images were obtained from the base of the skull through the vertex without contrast.  Comparison: 12/18/2011.  Findings: Streak artifact from left hearing device.  Left-sided subdural hematoma with maximal thickness 5.3 mm without significant  change.  Degree of left subarachnoid hemorrhage unchanged.  Small vessel disease type changes without CT evidence of large acute infarct.  No intracranial mass lesion detected on this unenhanced exam.  No hydrocephalus.  IMPRESSION: Similar appearance of left-sided subdural hematoma and amount of left-sided subarachnoid hemorrhage.  Original Report Authenticated By: Fuller Canada, M.D.   Scheduled Meds:   . amiodarone  100 mg Oral Daily  . docusate sodium  100 mg Oral BID  . fluticasone  1 spray Each Nare Daily  . isosorbide mononitrate  30 mg Oral  Daily  . loratadine  10 mg Oral Daily  . pantoprazole  40 mg Oral Q1200  . polyethylene glycol  17 g Oral BID  . simvastatin  20 mg Oral q1800   Continuous Infusions:   . sodium chloride 50 mL/hr at 12/19/11 2153  . DISCONTD: sodium chloride       Assessment/Plan: 1. Traumatic subdural hematoma, subarachnoid hemorrhage: Stable by CT. Per neurosurgery. 2. Left femoral neck fracture: Status post left hip hemiarthroplasty. Per orthopedics. 3. Posthemorrhagic anemia: Stable/improved status post 2 units packed red blood cells. 4. Paroxysmal atrial fibrillation: Stable. Per cardiology. Not a warfarin candidate. Amiodarone decreased because of bradycardia. 5. Coagulopathy secondary to Coumadin: Given FFP and vitamin K. 6. Compensated diastolic congestive heart failure: Stable 7. Hyponatremia: Slightly worse today. Recheck in the morning. 8. Acute renal failure/Chronic kidney disease stage III: Acute renal failure appears to be resolved. 9. Thrombocytopenia: Worse today. Not on any heparin  products. Etiology unclear. Seen September 2012, August 2012.  Code Status: Full code Family Communication: None bedside Disposition Plan: Skilled nursing facility   Brendia Sacks, MD  [facility] Pager [phone]  If 7PM-7AM, please contact night-coverage [email or web address] Password [email or web address] 12/20/2011, 9:43 AM    LOS: 6 days

## 2011-12-20 NOTE — Progress Notes (Signed)
Pt. C/0  bladder fullness. Voided only very small amt. >20cc since 7pm. I&O cath done = 1--- cc. Pt. Verbalized relief. Will continue to monitor. Mechele Collin RN

## 2011-12-21 ENCOUNTER — Encounter (HOSPITAL_COMMUNITY): Payer: Self-pay | Admitting: Orthopedic Surgery

## 2011-12-21 ENCOUNTER — Inpatient Hospital Stay (HOSPITAL_COMMUNITY): Payer: Medicare Other

## 2011-12-21 DIAGNOSIS — S72009A Fracture of unspecified part of neck of unspecified femur, initial encounter for closed fracture: Secondary | ICD-10-CM

## 2011-12-21 DIAGNOSIS — W19XXXA Unspecified fall, initial encounter: Secondary | ICD-10-CM

## 2011-12-21 DIAGNOSIS — I5032 Chronic diastolic (congestive) heart failure: Secondary | ICD-10-CM

## 2011-12-21 DIAGNOSIS — S069X9A Unspecified intracranial injury with loss of consciousness of unspecified duration, initial encounter: Secondary | ICD-10-CM

## 2011-12-21 DIAGNOSIS — I509 Heart failure, unspecified: Secondary | ICD-10-CM

## 2011-12-21 LAB — CBC
HCT: 28.1 % — ABNORMAL LOW (ref 36.0–46.0)
MCH: 29.3 pg (ref 26.0–34.0)
MCHC: 33.5 g/dL (ref 30.0–36.0)
MCV: 87.5 fL (ref 78.0–100.0)
Platelets: 106 10*3/uL — ABNORMAL LOW (ref 150–400)
RDW: 15.3 % (ref 11.5–15.5)

## 2011-12-21 LAB — GLUCOSE, CAPILLARY: Glucose-Capillary: 102 mg/dL — ABNORMAL HIGH (ref 70–99)

## 2011-12-21 LAB — BASIC METABOLIC PANEL
BUN: 11 mg/dL (ref 6–23)
CO2: 21 mEq/L (ref 19–32)
Calcium: 8.5 mg/dL (ref 8.4–10.5)
Chloride: 98 mEq/L (ref 96–112)
Creatinine, Ser: 0.86 mg/dL (ref 0.50–1.10)

## 2011-12-21 MED ORDER — POTASSIUM CHLORIDE CRYS ER 20 MEQ PO TBCR
20.0000 meq | EXTENDED_RELEASE_TABLET | Freq: Two times a day (BID) | ORAL | Status: AC
  Administered 2011-12-21 (×2): 20 meq via ORAL
  Filled 2011-12-21 (×2): qty 1

## 2011-12-21 MED ORDER — FUROSEMIDE 10 MG/ML IJ SOLN
40.0000 mg | Freq: Two times a day (BID) | INTRAMUSCULAR | Status: AC
  Administered 2011-12-21 (×2): 40 mg via INTRAVENOUS
  Filled 2011-12-21: qty 4

## 2011-12-21 MED ORDER — FUROSEMIDE 10 MG/ML IJ SOLN
INTRAMUSCULAR | Status: AC
  Filled 2011-12-21: qty 4

## 2011-12-21 NOTE — Clinical Documentation Improvement (Signed)
Anemia Blood Loss Clarification  THIS DOCUMENT IS NOT A PERMANENT PART OF THE MEDICAL RECORD          12/21/11  Dear Dr. Irene Limbo Marton Redwood  In an effort to better capture your patient's severity of illness, reflect appropriate length of stay and utilization of resources, a review of the patient medical record has revealed the following indicators.   Based on your clinical judgment, please clarify and document in a progress note and/or discharge summary the clinical condition associated with the following supporting information: In responding to this query please exercise your independent judgment.  The fact that a query is asked, does not imply that any particular answer is desired or expected.   "Posthemorrhagic Anemia" is documented in the progress notes. Please consider the below (if your clinical findings/judgment agree) as you document the patient's diagnosis/condition(s) in the progress note and discharge summary. Thank you!   Possible Clinical Conditions?   - acute blood loss anemia   - Other condition (please document in the progress notes and/or discharge summary)   - Cannot Clinically determine at this time   Supporting Information:   - Progress note 4/2 (rizwan): "Anemia- posthemorrhagic  Hgb 13.7 on 11/19/10 and now 7.0  Will order 2 units PRBC."   - "Acute on chronic anemia" progress note 4/2 (rayburn)   - "Anemia --improved after transfusion yesterday." progress note 4/3 (brackbill)   - "Anemia- posthemorrhagic  Hgb improved as expected s/p 2U PRBC" progress note 4/3 (mcClung)   Component HGB HCT  Latest Ref Rng 12.0 - 15.0 g/dL 16.1 - 09.6 %  0/12/5407 13.7 39.9  12/14/2011 8.4 (L) 25.1 (L)  12/15/2011 7.9 (L) 23.8 (L)  12/15/2011 7.0 (L) 20.9 (L)  12/16/2011 9.9 (L) 29.2 (L)  12/17/2011 9.1 (L) 26.3 (L)  12/18/2011 8.8 (L) 25.7 (L)  12/18/2011 9.6 (L) 28.8 (L)  12/19/2011 7.3 (L) 22.1 (L)  12/20/2011 8.9 (L) 26.5 (L)  12/21/2011 9.4 (L) 28.1 (L)     No additional  documentation in chart upon review. SM   Thank You,  Saul Fordyce  Clinical Documentation Specialist: [phone] Pager  [facility] Information [facility] [place]    RESPOND TO THE THIS QUERY, FOLLOW THE INSTRUCTIONS BELOW:  1. If needed, update documentation for the patient's encounter via the notes activity.  2. Access this query again and click edit on the In [employer].  3. After updating, or not, click F2 to complete all highlighted (required) fields concerning your review. Select "additional documentation in the medical record" OR "no additional documentation provided".  4. Click Sign note button.  5. The deficiency will fall out of your In Basket *Please let us know if you are not able to complete this workflow by phone or e-mail (listed below).

## 2011-12-21 NOTE — Progress Notes (Signed)
Await increased activity tolerance and OT evaluation to try to get insurance approval for an inpatient acute rehab stay. I have contacted Dr. Irene Limbo for an OT order. Please call with any questions. Pager [phone]

## 2011-12-21 NOTE — Progress Notes (Signed)
Physical Therapy Treatment Patient Details Name: [name] MRN: [id number] DOB: [birth year] Today's Date: 12/21/2011  PT Assessment/Plan  PT - Assessment/Plan Comments on Treatment Session: Pt admitted s/p left hip fracture and was able to tolerate OOB to chair this am.  Pt motivated, but is limited by fear of pain.  Pt, however, reports no pain this am. PT Plan: Discharge plan remains appropriate;Frequency remains appropriate PT Frequency: Min 6X/week Follow Up Recommendations: Skilled nursing facility Equipment Recommended: Defer to next venue PT Goals  Acute Rehab PT Goals PT Goal Formulation: With patient/family Time For Goal Achievement: 2 weeks Pt will go Supine/Side to Sit: with mod assist PT Goal: Supine/Side to Sit - Progress: Goal set today PT Goal: Sit to Stand - Progress: Progressing toward goal PT Goal: Stand to Sit - Progress: Progressing toward goal PT Transfer Goal: Bed to Chair/Chair to Bed - Progress: Progressing toward goal  PT Treatment Precautions/Restrictions  Precautions Precautions: Posterior Hip Precaution Booklet Issued: No Required Braces or Orthoses: No Restrictions Weight Bearing Restrictions: Yes LLE Weight Bearing: Weight bearing as tolerated Pain 0/10 with treatment. Mobility (including Balance) Bed Mobility Bed Mobility: Yes Supine to Sit: 1: +2 Total assist;Patient percentage (comment);HOB elevated (Comment degrees) ((pt=20%); HOB 45 degrees.) Supine to Sit Details (indicate cue type and reason): Assist to bilateral LEs to rotate to EOB while maintaining posterior hip precautions for left LE.  Assist also to trunk to translate anterior.  Cues for sequence. Sit to Supine: Not Tested (comment) Transfers Transfers: Yes Sit to Stand: 1: +2 Total assist;Patient percentage (comment);With upper extremity assist;From bed ((pt=20%)) Sit to Stand Details (indicate cue type and reason): Assist to translate trunk anterior over BOS with facilitation at  ischials with encouragement to weight bear through bilateral LEs.  Cues for hand placement. Stand to Sit: 1: +2 Total assist;Patient percentage (comment);With upper extremity assist;To chair/3-in-1 ((pt=20%)) Stand to Sit Details: Assist to slow descent to chair while blocking right knee to prevent buckling.  Cues for hand placement. Stand Pivot Transfers: 1: +2 Total assist;Patient percentage (comment) ((pt=20%)) Stand Pivot Transfer Details (indicate cue type and reason): Assist to facilitate rotation of pelvis/trunk with cues for sequence.  Pt with difficulty off weighting bilateral LEs in order to advance for gait. Ambulation/Gait Ambulation/Gait: No Stairs: No Wheelchair Mobility Wheelchair Mobility: No  Posture/Postural Control Posture/Postural Control: No significant limitations Balance Balance Assessed: No Exercise  Total Joint Exercises Ankle Circles/Pumps: AROM;Left;10 reps;Supine Quad Sets: AROM;Left;10 reps;Supine Hip ABduction/ADduction: AAROM;Left;10 reps;Supine End of Session PT - End of Session Equipment Utilized During Treatment: Gait belt;Left knee immobilizer Activity Tolerance: Patient tolerated treatment well Patient left: in chair;with call bell in reach Nurse Communication: Mobility status for transfers;Mobility status for ambulation General Behavior During Session: [facility] for tasks performed Cognition: Impaired Cognitive Impairment: Difficulty problem solving.  Cephus Shelling 12/21/2011, 11:48 AM  12/21/2011 Cephus Shelling, PT, DPT [phone]

## 2011-12-21 NOTE — Progress Notes (Signed)
PROGRESS NOTE  [name] OZH:[id number] DOB: 1927/01/16 DOA: 12/14/2011 PCP: Ailene Ravel, MD, MD Cardiologist: Arvilla Meres, M.D. (Heart failure clinic) Electrophysiologist: Lewayne Bunting, M.D.  Brief narrative: 78 year old woman transferred from [facility]. status post fall with traumatic subarachnoid hemorrhage, left hip fracture. Admitted by the trauma service.  Chart review: 11/19/2011-11/22/2011 Hospitalization: Acute/chronic diastolic congestive heart failure  Past medical history: Atrial fibrillation, diastolic congestive heart, chronic kidney disease stage III, non-ST elevation MI,:/Kidney/ovarian cancer status post surgery  Consultants:  Admitted by the trauma service  Neurosurgery  Cardiology  Orthopedics  Physical therapy: Skilled nursing facility  Procedures:  April 5: Left hip arthroplasty  Interim History: Interval documentation review. Patient started on Lasix. Clear for discharge per orthopedics.  Subjective: No complaints.  Objective: Filed Vitals:   12/20/11 0644 12/20/11 1437 12/20/11 2141 12/21/11 0512  BP: 159/72 166/76 164/82 160/88  Pulse: 86 78 77 83  Temp: 98.4 F (36.9 C) 98.3 F (36.8 C) 97.4 F (36.3 C) 98.8 F (37.1 C)  TempSrc:  Oral Oral Oral  Resp: 18 18 18 18   Height:      Weight:      SpO2: 95% 96% 96% 94%    Intake/Output Summary (Last 24 hours) at 12/21/11 0900 Last data filed at 12/21/11 0700  Gross per 24 hour  Intake   1040 ml  Output    450 ml  Net    590 ml    Exam:   General:  Appears calm and comfortable.  Cardiovascular: Regular rate and rhythm. No murmur, rub, gallop. No lower extremity edema.  Respiratory: Clear to auscultation bilaterally. No wheezes, rales, rhonchi. Normal respiratory effort.  Psychiatric: Grossly normal mood and affect. Speech fluent and appropriate.  Data Reviewed: Basic Metabolic Panel:  Lab 12/20/11 6295 12/19/11 0600 12/18/11 0545 12/17/11 0400 12/16/11 0400    NA 128* 130* 131* 130* 134*  K 4.4 4.9 -- -- --  CL 96 100 99 96 96  CO2 23 25 26 26 28   GLUCOSE 87 106* 91 88 102*  BUN 13 17 20  27* 34*  CREATININE 0.92 1.02 1.14* 1.40* 2.12*  CALCIUM 8.2* 8.1* 8.3* 8.1* 8.5  MG -- -- -- -- --  PHOS -- -- -- -- --   Liver Function Tests:  Lab 12/15/11 0342  AST 39*  ALT 23  ALKPHOS 40  BILITOT 0.7  PROT 5.7*  ALBUMIN 3.3*   CBC:  Lab 12/20/11 0500 12/19/11 0600 12/18/11 0900 12/18/11 0545 12/17/11 0400  WBC 6.4 5.8 5.6 4.4 5.9  NEUTROABS -- -- -- -- --  HGB 8.9* 7.3* 9.6* 8.8* 9.1*  HCT 26.5* 22.1* 28.8* 25.7* 26.3*  MCV 87.2 88.0 87.0 86.0 84.8  PLT 69* PLATELET CLUMPS NOTED ON SMEAR, UNABLE TO ESTIMATE PLATELET CLUMPING, SUGGEST RECOLLECTION OF SAMPLE IN CITRATE TUBE. 106* 96*   Recent Results (from the past 240 hour(s))  MRSA PCR SCREENING     Status: Normal   Collection Time   12/14/11  7:26 PM      Component Value Range Status Comment   MRSA by PCR NEGATIVE  NEGATIVE  Final      Studies: Ct Head Wo Contrast  12/21/2011  *RADIOLOGY REPORT*  Clinical Data: Follow-up for subdural hematoma.  CT HEAD WITHOUT CONTRAST  Technique:  Contiguous axial images were obtained from the base of the skull through the vertex without contrast.  Comparison: Multiple prior head CTs, most recently 12/18/2011.  Findings: Study is slightly limited by large amount of gross patient motion.  There continues to be a small extra-axial high attenuation fluid collection overlying the left cerebral hemisphere laterally compatible with a small subdural hematoma, which is decreased.  There continues to be a small amount of high attenuation material associated with the sulci overlying the left cerebral hemisphere near the vertex, compatible with resolving subarachnoid hemorrhage.  No new abnormal intra or extra-axial fluid collections are identified.  There is a background of mild cerebral and cerebellar atrophy.  There is a small amount of decreased attenuation in the  deep and periventricular Gorton matter of the cerebral hemispheres bilaterally, compatible with chronic microvascular ischemic disease (unchanged).  No acute displaced skull fractures are identified.  Visualized paranasal sinuses and mastoids are well pneumatized.  IMPRESSION: 1.  Compared to prior study dated 12/18/2011 the size of the small left subdural hematoma and small amount of left-sided subarachnoid hemorrhage have both decreased slightly. The appearance of the brain is otherwise unchanged, as above.  Original Report Authenticated By: Florencia Reasons, M.D.   Scheduled Meds:    . amiodarone  100 mg Oral Daily  . docusate sodium  100 mg Oral BID  . fluticasone  1 spray Each Nare Daily  . furosemide  40 mg Intravenous BID  . isosorbide mononitrate  30 mg Oral Daily  . loratadine  10 mg Oral Daily  . pantoprazole  40 mg Oral Q1200  . polyethylene glycol  17 g Oral BID  . potassium chloride  20 mEq Oral BID  . simvastatin  20 mg Oral q1800  . [place] petrolatum       Continuous Infusions:    . DISCONTD: sodium chloride 50 mL/hr at 12/19/11 2153     Assessment/Plan: 1. Traumatic subdural hematoma, subarachnoid hemorrhage: Stable by CT. Per neurosurgery. 2. Left femoral neck fracture: Status post left hip hemiarthroplasty. Per orthopedics stable for discharge. 3. Posthemorrhagic anemia: Repeat lab pending. Status post 2 units packed red blood cells. 4. Paroxysmal atrial fibrillation: Stable. Per cardiology. Not a warfarin candidate. Amiodarone decreased because of bradycardia. 5. Coagulopathy secondary to Coumadin: Given FFP and vitamin K. 6. Compensated diastolic congestive heart failure: Stable. Lasix resumed. 7. Hyponatremia: Repeat basic metabolic panel pending. Recheck in the morning. 8. Acute renal failure/Chronic kidney disease stage III: Acute renal failure appears to be resolved. 9. Thrombocytopenia: Repeat CBC pending. Not on any heparin  products. Etiology unclear. Seen  September 2012, August 2012.  Code Status: Full code Family Communication: None bedside Disposition Plan: Skilled nursing facility   Brendia Sacks, MD  [facility] Pager [phone]  If 7PM-7AM, please contact night-coverage [email or web address] Password [email or web address] 12/21/2011, 9:00 AM    LOS: 7 days

## 2011-12-21 NOTE — Progress Notes (Signed)
Pt transported off unit to Radiology for CT head

## 2011-12-21 NOTE — Progress Notes (Signed)
Patient ID: [name], female   DOB: 05-01-1927, 78 y.o.   MRN: [id number] Discontinue wound VAC today. Start Mepilex dressing changed today. Okay for discharge to skilled nursing from an orthopedic standpoint.

## 2011-12-21 NOTE — Consult Note (Signed)
Physical Medicine and Rehabilitation Consult Reason for Consult: SDH/left hip fracture Referring Phsyician: Triad [name] is an 78 y.o. female.   HPI: 78 year old right-handed female with history of atrial fibrillation on chronic Coumadin. Admitted April 1 from [facility] after a ground-level fall while taking a shower. She struck her head and left hip. She sustained a laceration to her scalp. There was reported loss of consciousness per her daughter. Patient with noted altered mental status. CT of the head showed minimal traumatic subdural hematoma. Neurosurgery Dr. Newell Coral consulted with conservative care. Patient did receive 4 units of fresh frozen plasma at outside hospital to reverse Coumadin. X-rays of left hip revealed left femoral neck fracture. Patient underwent bipolar hip arthroplasty April 5 per Dr. Lajoyce Corners. Weightbearing as tolerated left lower extremity with posterior hip cautions. Postoperative anemia and transfused. She remains off Coumadin secondary to a subdural hematoma with cardiac rate-controlled. M.D. request physical medicine rehabilitation consult to consider inpatient rehabilitation services  Review of Systems  Cardiovascular: Positive for palpitations.  Gastrointestinal: Positive for constipation.  Musculoskeletal: Positive for myalgias.  Neurological: Positive for dizziness.  All other systems reviewed and are negative.   Past Medical History  Diagnosis Date  . Thrombocytopenia   . Sleep related leg cramps   . Unspecified hearing loss   . HTN (hypertension)   . Lumbar pain   . Osteopenia   . CKD (chronic kidney disease), stage III   . Chronic constipation   . Unspecified urinary incontinence   . Hyponatremia   . Mitral valve disorders   . Disturbance of skin sensation   . Sciatica   . Unspecified cataract   . Lumbosacral spondylosis without myelopathy   . Edema   . HLD (hyperlipidemia)   . Other B-complex deficiencies   . Unspecified vitamin D  deficiency   . Colon cancer 1961  . Ovarian cancer 1970  . History of kidney cancer 2005  . Atrial fibrillation     amiodarone; coumadin  . NSTEMI (non-ST elevated myocardial infarction)     in setting of hypoxic resp failure 9/12: ? type 2 NSTEMI vs. true ischemia (med therapy chosen)  . Chronic diastolic heart failure     Echo 8/12: Moderate LVH, EF 55-60%, mild MR, mild BAE, PASP 60. Myoview 8/12: Negative for ischemia, normal LV function.   . DNR (do not resuscitate)   . Morbid obesity    Past Surgical History  Procedure Date  . Appendectomy 1945  . Ankle surgery     Pin placed and later removed  . 2-d echocardiogram 04/30/2011  . Negative myoview stress test 05/07/11    for ischemia or infarction. EF of 62%   Family History  Problem Relation Age of Onset  . Heart attack Son    Social History:  reports that she has never smoked. Her smokeless tobacco use includes Snuff. She reports that she does not drink alcohol or use illicit drugs. Allergies: No Known Allergies Medications Prior to Admission  Medication Dose Route Frequency Provider Last Rate Last Dose  . acetaminophen (OFIRMEV) IV 1,000 mg  1,000 mg Intravenous Once PRN Kipp Brood, MD      . acetaminophen (TYLENOL) tablet 650 mg  650 mg Oral Q6H PRN Lonia Blood, MD      . amiodarone (PACERONE) tablet 100 mg  100 mg Oral Daily Quintella Reichert, MD   100 mg at 12/20/11 0951  . ceFAZolin (ANCEF) IVPB 1 g/50 mL premix  1 g Intravenous 60 min Pre-Op  Nadara Mustard, MD   1 g at 12/18/11 1121  . ceFAZolin (ANCEF) IVPB 1 g/50 mL premix  1 g Intravenous Q6H Nadara Mustard, MD   1 g at 12/19/11 0502  . chlorhexidine (HIBICLENS) 4 % liquid 4 application  60 mL Topical Once Nadara Mustard, MD   4 application at 12/18/11 [phone]  . chlorhexidine (HIBICLENS) 4 % liquid 4 application  60 mL Topical Once Nadara Mustard, MD   4 application at 12/18/11 0725  . docusate sodium (COLACE) capsule 100 mg  100 mg Oral BID Almond Lint, MD   100 mg  at 12/20/11 2233  . fluticasone (FLONASE) 50 MCG/ACT nasal spray 1 spray  1 spray Each Nare Daily Almond Lint, MD   1 spray at 12/20/11 0952  . isosorbide mononitrate (IMDUR) 24 hr tablet 30 mg  30 mg Oral Daily Lonia Blood, MD   30 mg at 12/20/11 0951  . loratadine (CLARITIN) tablet 10 mg  10 mg Oral Daily Almond Lint, MD   10 mg at 12/20/11 0951  . morphine 2 MG/ML injection 2-4 mg  2-4 mg Intravenous Q2H PRN Almond Lint, MD   2 mg at 12/21/11 0110  . nitroGLYCERIN (NITROSTAT) SL tablet 0.4 mg  0.4 mg Sublingual Q5 min PRN Almond Lint, MD      . ondansetron (ZOFRAN) tablet 4 mg  4 mg Oral Q6H PRN Almond Lint, MD       Or  . ondansetron (ZOFRAN) injection 4 mg  4 mg Intravenous Q6H PRN Almond Lint, MD      . ondansetron (ZOFRAN) injection 4 mg  4 mg Intravenous Once PRN Kipp Brood, MD      . oxyCODONE (Oxy IR/ROXICODONE) immediate release tablet 5-10 mg  5-10 mg Oral Q4H PRN Lonia Blood, MD   10 mg at 12/21/11 0221  . pantoprazole (PROTONIX) EC tablet 40 mg  40 mg Oral Q1200 Almond Lint, MD   40 mg at 12/20/11 1704  . phytonadione (VITAMIN K) 10 mg in dextrose 5 % 50 mL IVPB  10 mg Intravenous Once Hewitt Shorts, MD   10 mg at 12/16/11 0902  . phytonadione (VITAMIN K) 10 mg in sodium chloride 0.9 % 50 mL IVPB  10 mg Intravenous Once Hewitt Shorts, MD   10 mg at 12/15/11 0830  . phytonadione (VITAMIN K) 10 mg in sodium chloride 0.9 % 50 mL IVPB  10 mg Intravenous Once Hewitt Shorts, MD   10 mg at 12/15/11 1455  . polyethylene glycol (MIRALAX / GLYCOLAX) packet 17 g  17 g Oral BID Lonia Blood, MD   17 g at 12/20/11 2233  . simvastatin (ZOCOR) tablet 20 mg  20 mg Oral q1800 Almond Lint, MD   20 mg at 12/20/11 1701  . Marek petrolatum (VASELINE) gel           . DISCONTD: 0.45 % NaCl with KCl 20 mEq / L infusion   Intravenous Continuous Lonia Blood, MD 75 mL/hr at 12/18/11 [phone]    . DISCONTD: 0.9 %  sodium chloride infusion   Intravenous Continuous Lonia Blood, MD 50 mL/hr at 12/19/11 2153    . DISCONTD: 0.9 %  sodium chloride infusion   Intravenous Continuous Nadara Mustard, MD      . DISCONTD: amiodarone (PACERONE) tablet 200 mg  200 mg Oral Daily Almond Lint, MD      . DISCONTD: chlorhexidine (HIBICLENS) 4 % liquid  4 application  60 mL Topical Once Nadara Mustard, MD      . DISCONTD: furosemide (LASIX) tablet 40 mg  40 mg Oral Daily Almond Lint, MD      . DISCONTD: HYDROcodone-acetaminophen (NORCO) 5-325 MG per tablet 1-2 tablet  1-2 tablet Oral Q4H PRN Almond Lint, MD   2 tablet at 12/18/11 0302  . DISCONTD: HYDROcodone-acetaminophen (NORCO) 5-325 MG per tablet 1-2 tablet  1-2 tablet Oral Q4H PRN Nadara Mustard, MD   2 tablet at 12/19/11 0729  . DISCONTD: HYDROmorphone (DILAUDID) 1 MG/ML injection           . DISCONTD: HYDROmorphone (DILAUDID) injection 0.25-0.5 mg  0.25-0.5 mg Intravenous Q5 min PRN Kipp Brood, MD   0.5 mg at 12/18/11 1259  . DISCONTD: HYDROmorphone (DILAUDID) injection 0.5-1 mg  0.5-1 mg Intravenous Q2H PRN Nadara Mustard, MD   1 mg at 12/19/11 0805  . DISCONTD: isosorbide mononitrate (IMDUR) 24 hr tablet 60 mg  60 mg Oral Daily Almond Lint, MD      . DISCONTD: metoCLOPramide (REGLAN) injection 5-10 mg  5-10 mg Intravenous Q8H PRN Nadara Mustard, MD      . DISCONTD: metoCLOPramide (REGLAN) tablet 5-10 mg  5-10 mg Oral Q8H PRN Nadara Mustard, MD      . DISCONTD: morphine 2 MG/ML injection 1-2 mg  1-2 mg Intravenous Q2H PRN Almond Lint, MD   2 mg at 12/18/11 0449  . DISCONTD: ondansetron (ZOFRAN) injection 4 mg  4 mg Intravenous Q6H PRN Nadara Mustard, MD      . DISCONTD: ondansetron [facility]) tablet 4 mg  4 mg Oral Q6H PRN Nadara Mustard, MD      . DISCONTD: oxyCODONE-acetaminophen (PERCOCET) 5-325 MG per tablet 1-2 tablet  1-2 tablet Oral Q4H PRN Nadara Mustard, MD      . DISCONTD: potassium chloride SA (K-DUR,KLOR-CON) CR tablet 20 mEq  20 mEq Oral Daily Almond Lint, MD      . DISCONTD: sodium chloride irrigation 0.9 %     PRN Nadara Mustard, MD   3,000 mL at 12/18/11 1145   Medications Prior to Admission  Medication Sig Dispense Refill  . acetaminophen (TYLENOL) 500 MG tablet Take 500 mg by mouth 2 (two) times daily as needed. For pain fever      . alendronate (FOSAMAX) 10 MG tablet Take 10 mg by mouth daily before breakfast. Take with a full glass of water on an empty stomach.      Marland Kitchen amiodarone (PACERONE) 200 MG tablet Take 1 tablet (200 mg total) by mouth daily.      Marland Kitchen aspirin 81 MG tablet Take 81 mg by mouth daily.       . cetirizine (ZYRTEC) 10 MG tablet Take 10 mg by mouth daily.       . fenofibrate micronized (LOFIBRA) 134 MG capsule Take 134 mg by mouth daily before breakfast.       . furosemide (LASIX) 20 MG tablet Take 2 tablets (40 mg total) by mouth daily.  30 tablet  0  . HYDROcodone-acetaminophen (VICODIN) 5-500 MG per tablet Take 1 tablet by mouth every 6 (six) hours as needed. For pain      . isosorbide mononitrate (IMDUR) 30 MG 24 hr tablet Take 60 mg by mouth daily.      Marland Kitchen lovastatin (MEVACOR) 40 MG tablet Take 40 mg by mouth at bedtime.       . mometasone (NASONEX) 50 MCG/ACT nasal spray  Place 2 sprays into the nose daily.  17 g  0  . pantoprazole (PROTONIX) 40 MG tablet Take 1 tablet (40 mg total) by mouth daily.  30 tablet  6  . potassium chloride SA (K-DUR,KLOR-CON) 20 MEQ tablet Take 20 mEq by mouth daily.      Marland Kitchen warfarin (COUMADIN) 1 MG tablet Take 0.5-1 mg by mouth every evening. Sunday, Monday, Tuesday, Thursday, Friday, Saturday take 1mg ; Wednesday take 0.5mg       . nitroGLYCERIN (NITROSTAT) 0.4 MG SL tablet Place 0.4 mg under the tongue every 5 (five) minutes as needed. For chest pain        Home: Home Living Lives With: Daughter Receives Help From: Family Type of Home: House Home Layout: One level Home Access: Level entry Bathroom Shower/Tub: Walk-in shower;Door [employer] Toilet: Handicapped height Bathroom Accessibility: Yes How Accessible: Accessible via walker Home Adaptive  Equipment: Shower chair with back;Walker - rolling;Straight cane  Functional History: Prior Function Level of Independence: Independent with gait;Independent with transfers;Needs assistance with ADLs Bath: Moderate Able to Take Stairs?: No Driving: No Vocation: Retired Functional Status:  Mobility: Bed Mobility Bed Mobility: Yes Sit to Supine: HOB elevated (comment degrees);1: +2 Total assist;Patient percentage (comment) (Pt 20%) Sit to Supine - Details (indicate cue type and reason): VC for sequencing. Pt able to move RLE, assist with LLE as well as assist through trunk. Transfers Transfers: Yes Sit to Stand: 1: +2 Total assist;Patient percentage (comment);With upper extremity assist;From chair/3-in-1 (Pt 10%) Sit to Stand Details (indicate cue type and reason): VC for hand placement and sequencing. Assist for anterior translation as well as maintaining hip precautions during stand. Stand to Sit: 2: Max assist;With upper extremity assist;To chair/3-in-1 Stand to Sit Details: VC for hand placement. Assist to control descent Stand Pivot Transfers: 1: +2 Total assist;Patient percentage (comment) (Pt 10%) Stand Pivot Transfer Details (indicate cue type and reason): Pt able to help minimally during transfer secondary to pain. VC throughout to maintain hip precautions. Tactile cues through pelvis and trunk to assist with transfer Ambulation/Gait Ambulation/Gait: No Stairs: No Wheelchair Mobility Wheelchair Mobility: No  ADL:    Cognition: Cognition Arousal/Alertness: Awake/alert Orientation Level: Oriented to person;Oriented to time Cognition Arousal/Alertness: Awake/alert Overall Cognitive Status: Appears within functional limits for tasks assessed Orientation Level: Oriented to person;Oriented to time  Blood pressure 160/88, pulse 83, temperature 98.8 F (37.1 C), temperature source Oral, resp. rate 18, height 5\' 5"  (1.651 m), weight 73.2 kg (161 lb 6 oz), SpO2 94.00%. Physical  Exam  Vitals reviewed. Constitutional: She is oriented to person, place, and time. She appears well-developed.       Patient is very hard of hearing with hearing aid in place.  HENT:  Head: Normocephalic.  Neck: Normal range of motion. Neck supple. No thyromegaly present.  Cardiovascular:       Irregular irregular  Pulmonary/Chest: Breath sounds normal. She has no wheezes.  Abdominal: She exhibits no distension. There is no tenderness.  Neurological: She is alert and oriented to person, place, and time.  Skin:       Hip incision clean and dry  Psychiatric: She has a normal mood and affect.    No results found for this or any previous visit (from the past 24 hour(s)). No results found.  Assessment/Plan: Diagnosis: Femoral neck fracture and mild brain injury 1. Does the need for close, 24 hr/day medical supervision in concert with the patient's rehab needs make it unreasonable for this patient to be served in a less  intensive setting? Yes and Potentially 2. Co-Morbidities requiring supervision/potential complications: Atrial fibrillation, history of MI, acute blood loss anemia, chronic back pain 3. Due to bladder management, bowel management, safety, skin/wound care, disease management, medication administration, pain management and patient education, does the patient require 24 hr/day rehab nursing? Yes 4. Does the patient require coordinated care of a physician, rehab nurse, PT (1-2 hrs/day, 5 days/week), OT (1-2 hrs/day, 5 days/week) and SLP (1-2 hrs/day, 5 days/week) to address physical and functional deficits in the context of the above medical diagnosis(es)? Potentially Addressing deficits in the following areas: balance, endurance, locomotion, strength, transferring, bowel/bladder control, bathing, dressing, feeding, grooming, toileting, cognition and psychosocial support 5. Can the patient actively participate in an intensive therapy program of at least 3 hrs of therapy per day at  least 5 days per week? Potentially 6. The potential for patient to make measurable gains while on inpatient rehab is good 7. Anticipated functional outcomes upon discharge from inpatients are minimal assistance to moderate assistance PT, minimal to moderate assistance OT, supervision SLP 8. Estimated rehab length of stay to reach the above functional goals is: 23 weeks 9. Does the patient have adequate social supports to accommodate these discharge functional goals? Yes 10. Anticipated D/C setting: Home 11. Anticipated post D/C treatments: HH therapy 12. Overall Rehab/Functional Prognosis: excellent  RECOMMENDATIONS: This patient's condition is appropriate for continued rehabilitative care in the following setting: CIR versus skilled nursing facility Patient has agreed to participate in recommended program. Potentially Note that insurance prior authorization may be required for reimbursement for recommended care.  Comment: Need to observe for functional progress. Patient is quite motivated to improve, however she will need to demonstrate more activity tolerance if we plan on bringing her to inpatient rehabilitation.Ranelle Oyster M.D. 12/21/2011

## 2011-12-21 NOTE — Progress Notes (Signed)
Advanced Heart Failure Rounding Note   Subjective:   S/P L hip arthroplasty  Admission weight 161 pounds. Today weight is 174 pounds but she has not been on home lasix. Incontinent urine.   Complains of L leg pain. Denies SOB/PND/Orthopnea     Objective:   Weight Range:  Vital Signs:   Temp:  [97.4 F (36.3 C)-98.8 F (37.1 C)] 98.8 F (37.1 C) (04/08 0512) Pulse Rate:  [77-83] 83  (04/08 0512) Resp:  [18] 18  (04/08 0512) BP: (160-166)/(76-88) 160/88 mmHg (04/08 0512) SpO2:  [94 %-96 %] 94 % (04/08 0512) Last BM Date: 12/20/11  Weight change: Filed Weights   12/14/11 2200  Weight: 73.2 kg (161 lb 6 oz)    Intake/Output:   Intake/Output Summary (Last 24 hours) at 12/21/11 0759 Last data filed at 12/21/11 0700  Gross per 24 hour  Intake   1360 ml  Output    450 ml  Net    910 ml     Physical Exam: General: Chronically ill  appearing. No resp difficulty HEENT: normal Neck: supple. JVP 7-8 . Carotids 2+ bilat; no bruits. No lymphadenopathy or thryomegaly appreciated. Cor: PMI nondisplaced. Regular rate & rhythm. No rubs, gallops or murmurs. Lungs: clear Abdomen: soft, nontender, nondistended. No hepatosplenomegaly. No bruits or masses. Good bowel sounds. Extremities: no cyanosis, clubbing, rash, edema LLE immobilier n place. L hip VAC in place Neuro: alert & orientedx3, cranial nerves grossly intact. moves all 4 extremities w/o difficulty. Affect pleasant  Telemetry:   Labs: Basic Metabolic Panel:  Lab 12/20/11 3086 12/19/11 0600 12/18/11 0545 12/17/11 0400 12/16/11 0400  NA 128* 130* 131* 130* 134*  K 4.4 4.9 4.7 4.5 4.1  CL 96 100 99 96 96  CO2 23 25 26 26 28   GLUCOSE 87 106* 91 88 102*  BUN 13 17 20  27* 34*  CREATININE 0.92 1.02 1.14* 1.40* 2.12*  CALCIUM 8.2* 8.1* 8.3* -- --  MG -- -- -- -- --  PHOS -- -- -- -- --    Liver Function Tests:  Lab 12/15/11 0342  AST 39*  ALT 23  ALKPHOS 40  BILITOT 0.7  PROT 5.7*  ALBUMIN 3.3*   No results  found for this basename: LIPASE:5,AMYLASE:5 in the last 168 hours No results found for this basename: AMMONIA:3 in the last 168 hours  CBC:  Lab 12/20/11 0500 12/19/11 0600 12/18/11 0900 12/18/11 0545 12/17/11 0400  WBC 6.4 5.8 5.6 4.4 5.9  NEUTROABS -- -- -- -- --  HGB 8.9* 7.3* 9.6* 8.8* 9.1*  HCT 26.5* 22.1* 28.8* 25.7* 26.3*  MCV 87.2 88.0 87.0 86.0 84.8  PLT 69* PLATELET CLUMPS NOTED ON SMEAR, UNABLE TO ESTIMATE PLATELET CLUMPING, SUGGEST RECOLLECTION OF SAMPLE IN CITRATE TUBE. 106* 96*    Cardiac Enzymes: No results found for this basename: CKTOTAL:5,CKMB:5,CKMBINDEX:5,TROPONINI:5 in the last 168 hours  BNP: BNP (last 3 results)  Basename 11/21/11 0630 11/19/11 2102 07/16/11 1140  PROBNP 12835.0* 8854.0* 444.0*     Other results:  EKG:   Imaging:  No results found.   Medications:     Scheduled Medications:    . amiodarone  100 mg Oral Daily  . docusate sodium  100 mg Oral BID  . fluticasone  1 spray Each Nare Daily  . isosorbide mononitrate  30 mg Oral Daily  . loratadine  10 mg Oral Daily  . pantoprazole  40 mg Oral Q1200  . polyethylene glycol  17 g Oral BID  . simvastatin  20  mg Oral q1800  . Abdo petrolatum         Infusions:    . DISCONTD: sodium chloride 50 mL/hr at 12/19/11 2153     PRN Medications:  acetaminophen, morphine, nitroGLYCERIN, ondansetron (ZOFRAN) IV, ondansetron, oxyCODONE   Assessment:  1)Fracture of left hip secondary to dizziness and a fall.  2) Past history of paroxysmal atrial fib, currently in NSR  3) Past history of NSTEMI type II in Sept 2012  Had normal lexiscan 05/01/11 with EF 62%  4) Past history of diastolic HF with mild pulmonary hypertension PA pressure 46 by echo 04/30/11  5) Previous long-term warfarin, presently reversed. INR 1.08  6) Subdural hematoma and subarachnoid hemorrhage secondary to fall.  7) Anemia stable Hgb 9.1. Platelets up slightly 96000  8) Acute on chronic renal failure,  resolved    Plan/Discussion:   Volume status mildly elevated. Weight appears to be elevated 13 pounds since admission. Will give Lasix 40 mg IV bid today and re-assess in am. She will need daily weights. I/Os will not be accurate due to urinary incontinence.    Length of Stay: 7 CLEGG,AMY 12/21/2011, 7:59 AM  Patient seen and examined with Tonye Becket, NP. We discussed all aspects of the encounter. I agree with the assessment and plan as stated above. Mild post-op volume overload. Agree with 1 day of IV lasix. Reassess tomorrow. Follow BMET.   Beatriz Settles,MD 8:50 AM

## 2011-12-21 NOTE — Progress Notes (Deleted)
Patient ID: [name], female   DOB: 22-Feb-1927, 78 y.o.   MRN: [id number] This continue wound VAC today. Start dry dressing changes today. Preferably with a Mepilex dressing.

## 2011-12-22 ENCOUNTER — Other Ambulatory Visit (HOSPITAL_COMMUNITY): Payer: Self-pay | Admitting: Neurosurgery

## 2011-12-22 DIAGNOSIS — I5033 Acute on chronic diastolic (congestive) heart failure: Secondary | ICD-10-CM

## 2011-12-22 DIAGNOSIS — S065X9A Traumatic subdural hemorrhage with loss of consciousness of unspecified duration, initial encounter: Secondary | ICD-10-CM

## 2011-12-22 LAB — CBC
Hemoglobin: 9.6 g/dL — ABNORMAL LOW (ref 12.0–15.0)
MCH: 30 pg (ref 26.0–34.0)
MCHC: 34 g/dL (ref 30.0–36.0)
Platelets: 181 10*3/uL (ref 150–400)
RDW: 15.6 % — ABNORMAL HIGH (ref 11.5–15.5)

## 2011-12-22 LAB — BASIC METABOLIC PANEL
BUN: 12 mg/dL (ref 6–23)
Calcium: 8.7 mg/dL (ref 8.4–10.5)
Creatinine, Ser: 1.11 mg/dL — ABNORMAL HIGH (ref 0.50–1.10)
GFR calc non Af Amer: 45 mL/min — ABNORMAL LOW (ref >90)
Glucose, Bld: 101 mg/dL — ABNORMAL HIGH (ref 70–99)

## 2011-12-22 MED ORDER — FUROSEMIDE 10 MG/ML IJ SOLN
INTRAMUSCULAR | Status: AC
  Filled 2011-12-22: qty 4

## 2011-12-22 MED ORDER — OXYCODONE HCL 5 MG PO TABS: 5.0000 mg | ORAL_TABLET | ORAL | Status: AC | PRN

## 2011-12-22 MED ORDER — POLYETHYLENE GLYCOL 3350 17 G PO PACK: 17.0000 g | PACK | Freq: Two times a day (BID) | ORAL | Status: AC

## 2011-12-22 MED ORDER — AMIODARONE HCL 100 MG PO TABS: 100.0000 mg | ORAL_TABLET | Freq: Every day | ORAL | Status: AC

## 2011-12-22 MED ORDER — FUROSEMIDE 10 MG/ML IJ SOLN
40.0000 mg | Freq: Once | INTRAMUSCULAR | Status: AC
  Administered 2011-12-22: 40 mg via INTRAVENOUS

## 2011-12-22 NOTE — Progress Notes (Signed)
Pt d/c to [facility] [place] today via [facility]. Pt, pt's dtr, and SNF aware of d/c. No other CSW needs reported or noted. CSW signing off. Dellie Burns, MSW, LCSWA [phone] (coverage)

## 2011-12-22 NOTE — Progress Notes (Signed)
Patient ID: [name], female   DOB: [birth year], 78 y.o.   MRN: [id number] Incision has good granulation tissue there is ecchymosis and bruising no necrosis no signs of skin breakdown no drainage. Immobilizer removed to prevent risk of pressure ulcers on the ankle.

## 2011-12-22 NOTE — Progress Notes (Signed)
Rehab admissions - Evaluated for possible admission.  I spoke with daughter and 2 other family members at bedside.  Dtr says that she cannot care for patient after a short rehab stay of 2-3 weeks.  Dtr prefers to go to [facility] in [place] as her first choice.  The second choice would be [facility] in [place].  I explained rehab vs SNF to daughter.  Dtr says that she cannot do min/mod assist for patient.  Updated case Production designer, theatre/television/film.  Given limitations of daughter, agree with need for SNF.  Call me for questions.  Pager [phone]

## 2011-12-22 NOTE — Evaluation (Signed)
Occupational Therapy Evaluation Patient Details Name: [name] MRN: [id number] DOB: 1927-05-18 Today's Date: 12/22/2011  Problem List:  Patient Active Problem List  Diagnoses  . Paroxysmal atrial fibrillation  . Chest pain  . OSA (obstructive sleep apnea)  . Encounter for long-term (current) use of anticoagulants  . Chronic diastolic heart failure  . Back pain  . NSTEMI (non-ST elevated myocardial infarction)  . Dysuria  . Dizziness  . CKD (chronic kidney disease)  . Cough  . Fatigue  . Acute on chronic diastolic heart failure  . Hypoxia  . Nausea  . Hematuria  . Chronic sinusitis  . Traumatic subdural hematoma  . Fracture of femoral neck, closed  . Acute blood loss anemia    Past Medical History:  Past Medical History  Diagnosis Date  . Thrombocytopenia   . Sleep related leg cramps   . Unspecified hearing loss   . HTN (hypertension)   . Lumbar pain   . Osteopenia   . CKD (chronic kidney disease), stage III   . Chronic constipation   . Unspecified urinary incontinence   . Hyponatremia   . Mitral valve disorders   . Disturbance of skin sensation   . Sciatica   . Unspecified cataract   . Lumbosacral spondylosis without myelopathy   . Edema   . HLD (hyperlipidemia)   . Other B-complex deficiencies   . Unspecified vitamin D deficiency   . Colon cancer 1961  . Ovarian cancer 1970  . History of kidney cancer 2005  . Atrial fibrillation     amiodarone; coumadin  . NSTEMI (non-ST elevated myocardial infarction)     in setting of hypoxic resp failure 9/12: ? type 2 NSTEMI vs. true ischemia (med therapy chosen)  . Chronic diastolic heart failure     Echo 8/12: Moderate LVH, EF 55-60%, mild MR, mild BAE, PASP 60. Myoview 8/12: Negative for ischemia, normal LV function.   . DNR (do not resuscitate)   . Morbid obesity    Past Surgical History:  Past Surgical History  Procedure Date  . Appendectomy 1945  . Ankle surgery     Pin placed and later removed  . 2-d  echocardiogram 04/30/2011  . Negative myoview stress test 05/07/11    for ischemia or infarction. EF of 62%  . Hip arthroplasty 12/18/2011    Procedure: ARTHROPLASTY BIPOLAR HIP;  Surgeon: Nadara Mustard, MD;  Location: [facility] OR;  Service: Orthopedics;  Laterality: Left;  Left Hip Hemiarthroplasty    OT Assessment/Plan/Recommendation OT Assessment Clinical Impression Statement: Pt. presents s/p fall resulting in left hip fx and SAH with below problem list. Pt. will benefit from skilled OT to increase pt's functional independence and decrease burden of care at D/C to next venue of care. OT Recommendation/Assessment: Patient will need skilled OT in the acute care venue OT Problem List: Decreased strength;Decreased activity tolerance;Impaired balance (sitting and/or standing);Decreased safety awareness;Decreased knowledge of use of DME or AE;Decreased knowledge of precautions;Pain Barriers to Discharge: None OT Therapy Diagnosis : Acute pain OT Plan OT Frequency: Min 2X/week OT Treatment/Interventions: Self-care/ADL training;DME and/or AE instruction;Therapeutic activities;Patient/family education;Balance training OT Recommendation Follow Up Recommendations: Inpatient Rehab Equipment Recommended: Defer to next venue Individuals Consulted Consulted and Agree with Results and Recommendations: Patient OT Goals Acute Rehab OT Goals OT Goal Formulation: With patient Time For Goal Achievement: 2 weeks ADL Goals Pt Will Perform Grooming: with set-up;with min assist;Standing at sink ADL Goal: Grooming - Progress: Goal set today Pt Will Perform Lower Body Bathing: with  set-up;with min assist;Sit to stand from chair;with adaptive equipment ADL Goal: Lower Body Bathing - Progress: Goal set today Pt Will Perform Lower Body Dressing: with set-up;with min assist;Sit to stand from chair;with adaptive equipment ADL Goal: Lower Body Dressing - Progress: Goal set today Pt Will Transfer to Toilet: with mod  assist;with DME;3-in-1;Stand pivot transfer ADL Goal: Toilet Transfer - Progress: Goal set today Additional ADL Goal #1: Pt,. will recall 3 hip precautions ADL Goal: Additional Goal #1 - Progress: Goal set today  OT Evaluation Precautions/Restrictions  Precautions Precautions: Posterior Hip Precaution Booklet Issued: No Precaution Comments: Pt. very HOH Required Braces or Orthoses: No Restrictions Weight Bearing Restrictions: Yes LLE Weight Bearing: Weight bearing as tolerated Prior Functioning Home Living Lives With: Daughter Receives Help From: Family Type of Home: House Home Layout: One level Home Access: Level entry Bathroom Shower/Tub: Walk-in shower;Door [employer] Toilet: Handicapped height Bathroom Accessibility: Yes How Accessible: Accessible via walker Home Adaptive Equipment: Shower chair with back;Walker - rolling;Straight cane Prior Function Level of Independence: Independent with gait;Independent with transfers;Needs assistance with ADLs Bath: Moderate Able to Take Stairs?: No Driving: No Vocation: Retired ADL ADL Eating/Feeding: Simulated;Set up Where Assessed - Eating/Feeding: Chair Grooming: Performed;Brushing hair;Set up Where Assessed - Grooming: Sitting, chair Upper Body Bathing: Simulated;Chest;Right arm;Left arm;Abdomen;Set up Where Assessed - Upper Body Bathing: Sitting, chair Lower Body Bathing: Simulated;+1 Total assistance Where Assessed - Lower Body Bathing: Sit to stand from chair Upper Body Dressing: Performed;Minimal assistance Upper Body Dressing Details (indicate cue type and reason): With donning gown Where Assessed - Upper Body Dressing: Sitting, chair Lower Body Dressing: Simulated;+1 Total assistance Where Assessed - Lower Body Dressing: Sit to stand from chair Toilet Transfer: Performed;+2 Total assistance;Comment for patient % (pt=40%) Toilet Transfer Details (indicate cue type and reason): Max verbal and tactile cuing to facilitate  safe transfer with hand placement and maintain hip precautions Toilet Transfer Method: Stand pivot Toilet Transfer Equipment: Bedside commode Toileting - Clothing Manipulation: Performed;+1 Total assistance Toileting - Clothing Manipulation Details (indicate cue type and reason): With undergarments due pt. relying on bil UE support Where Assessed - Toileting Clothing Manipulation: Sit to stand from 3-in-1 or toilet Toileting - Hygiene: Performed;+1 Total assistance Where Assessed - Toileting Hygiene: Sit to stand from 3-in-1 or toilet Tub/Shower Transfer: Not assessed Ambulation Related to ADLs: NA ADL Comments: Pt. unable to recall precautions and re-educated on 3 hip precautions. Pt. fearful of moving due to fear of falling and provided with mod verbal cues throughout session for encouragement and reassurance.     Cognition Cognition Arousal/Alertness: Awake/alert Overall Cognitive Status: Impaired Orientation Level: Oriented X4 Safety/Judgement: Decreased awareness of safety precautions Awareness of Deficits: Decreased awareness of deficits;Other (comment) (pt. unable to state surgical area) Cognition - Other Comments: Pt. thought knee was area surgery was completed on.     Extremity Assessment RUE Assessment RUE Assessment: Within Functional Limits LUE Assessment LUE Assessment: Within Functional Limits Mobility  Bed Mobility Bed Mobility: Yes Supine to Sit: 1: +2 Total assist;Patient percentage (comment) (pt=40%) Supine to Sit Details (indicate cue type and reason): Max verbal cues for technique and use of pad to facilitate trunk off bed and support of left LE for scooting Transfers Transfers: Yes Sit to Stand: 1: +2 Total assist;Patient percentage (comment);From bed;With upper extremity assist (pt=40%) Sit to Stand Details (indicate cue type and reason): Facilitation at hips to achieve upright position    End of Session OT - End of Session Equipment Utilized During  Treatment: Gait belt Activity  Tolerance: Patient tolerated treatment well Patient left: in chair;with call bell in reach Nurse Communication: Mobility status for transfers General Behavior During Session: [facility] for tasks performed Cognition: Impaired Cognitive Impairment: Decreased problem solving and delayed processing of information  Co-treat with Flora Lipps, OTR/L Pager [phone] 12/22/2011, 9:07 AM

## 2011-12-22 NOTE — Progress Notes (Signed)
   CARE MANAGEMENT NOTE 12/22/2011  Patient:  [name], [name]   Account Number:  [id number]  Date Initiated:  12/22/2011  Documentation initiated by:  Onnie Boer  Subjective/Objective Assessment:   PT WAS ADMITTED WITH SAH     Action/Plan:   PROGRESSION OF CARE AND DISCHARGE PLANNING   Anticipated DC Date:  12/22/2011   Anticipated DC Plan:  SKILLED NURSING FACILITY  In-house referral  Clinical Social Worker      DC Planning Services  CM consult      Choice offered to / List presented to:             Status of service:  Completed, signed off Medicare Important Message given?   (If response is "NO", the following Medicare IM given date fields will be blank) Date Medicare IM given:   Date Additional Medicare IM given:    Discharge Disposition:  SKILLED NURSING FACILITY  Per UR Regulation:  Reviewed for med. necessity/level of care/duration of stay  If discussed at Long Length of Stay Meetings, dates discussed:    Comments:  12/22/11 Onnie Boer, RN, BSN DC IS DC'D TO SNF

## 2011-12-22 NOTE — Progress Notes (Signed)
PROGRESS NOTE  [name] YNW:[id number] DOB: Jan 31, 1927 DOA: 12/14/2011 PCP: Ailene Ravel, MD, MD Cardiologist: Arvilla Meres, M.D. (Heart failure clinic) Electrophysiologist: Lewayne Bunting, M.D.  Brief narrative: 78 year old woman transferred from [facility] status post fall with traumatic subarachnoid hemorrhage, left hip fracture. Admitted by the trauma service and seen by cardiology, neurosurgery and orthopedics in consultation. Patient was cleared for hip surgery by neurosurgery and cardiology and successfully underwent left hip arthroplasty. Serial CAT scans of the head and shown some improvement in hemorrhage. She has remained clinically stable and has been cleared for discharge by the neurosurgery and orthopedic services. She is not a candidate for Coumadin, anti-thrombotic or antiplatelet agents at a minimum for the near future.  Chart review:  11/19/2011-11/22/2011 Hospitalization: Acute/chronic diastolic congestive heart failure  Past medical history: Atrial fibrillation, diastolic congestive heart, chronic kidney disease stage III, non-ST elevation MI,:/Kidney/ovarian cancer status post surgery  Consultants:  Admitted by the trauma service  Neurosurgery  Cardiology  Orthopedics  Physical medicine and rehabilitation  Physical therapy: Skilled nursing facility  Procedures:  April 5: Left hip arthroplasty  Interim History: Interval documentation reviewed. Clear for discharge per orthopedics and neurosurgery.  Subjective: No complaints.  Objective: Filed Vitals:   12/21/11 1448 12/21/11 2152 12/22/11 0636 12/22/11 0815  BP: 150/74 146/54 155/80 131/67  Pulse: 94 75 67 78  Temp: 97.9 F (36.6 C) 97 F (36.1 C) 96.7 F (35.9 C) 96.9 F (36.1 C)  TempSrc: Oral   Oral  Resp: 16 16 16 16   Height:      Weight:      SpO2: 97% 96% 100% 95%    Intake/Output Summary (Last 24 hours) at 12/22/11 1143 Last data filed at 12/22/11 0500  Gross per 24 hour   Intake    960 ml  Output      0 ml  Net    960 ml    Exam:   General:  Appears calm and comfortable.  Cardiovascular: Regular rate and rhythm. No murmur, rub, gallop. No lower extremity edema.  Respiratory: Clear to auscultation bilaterally. No wheezes, rales, rhonchi. Normal respiratory effort.  Psychiatric: Grossly normal mood and affect. Speech fluent and appropriate.  Data Reviewed: Basic Metabolic Panel:  Lab 12/22/11 6578 12/21/11 1005 12/20/11 0500 12/19/11 0600 12/18/11 0545  NA 130* 129* 128* 130* 131*  K 4.3 4.1 -- -- --  CL 95* 98 96 100 99  CO2 27 21 23 25 26   GLUCOSE 101* 137* 87 106* 91  BUN 12 11 13 17 20   CREATININE 1.11* 0.86 0.92 1.02 1.14*  CALCIUM 8.7 8.5 8.2* 8.1* 8.3*  MG -- -- -- -- --  PHOS -- -- -- -- --   CBC:  Lab 12/22/11 0615 12/21/11 1005 12/20/11 0500 12/19/11 0600 12/18/11 0900  WBC 5.9 7.6 6.4 5.8 5.6  NEUTROABS -- -- -- -- --  HGB 9.6* 9.4* 8.9* 7.3* 9.6*  HCT 28.2* 28.1* 26.5* 22.1* 28.8*  MCV 88.1 87.5 87.2 88.0 87.0  PLT 181 106* 69* PLATELET CLUMPS NOTED ON SMEAR, UNABLE TO ESTIMATE PLATELET CLUMPING, SUGGEST RECOLLECTION OF SAMPLE IN CITRATE TUBE.   Recent Results (from the past 240 hour(s))  MRSA PCR SCREENING     Status: Normal   Collection Time   12/14/11  7:26 PM      Component Value Range Status Comment   MRSA by PCR NEGATIVE  NEGATIVE  Final      Studies: Ct Head Wo Contrast  12/21/2011  *RADIOLOGY REPORT*  Clinical  Data: Follow-up for subdural hematoma.  CT HEAD WITHOUT CONTRAST  Technique:  Contiguous axial images were obtained from the base of the skull through the vertex without contrast.  Comparison: Multiple prior head CTs, most recently 12/18/2011.  Findings: Study is slightly limited by large amount of gross patient motion.  There continues to be a small extra-axial high attenuation fluid collection overlying the left cerebral hemisphere laterally compatible with a small subdural hematoma, which is decreased.   There continues to be a small amount of high attenuation material associated with the sulci overlying the left cerebral hemisphere near the vertex, compatible with resolving subarachnoid hemorrhage.  No new abnormal intra or extra-axial fluid collections are identified.  There is a background of mild cerebral and cerebellar atrophy.  There is a small amount of decreased attenuation in the deep and periventricular Douty matter of the cerebral hemispheres bilaterally, compatible with chronic microvascular ischemic disease (unchanged).  No acute displaced skull fractures are identified.  Visualized paranasal sinuses and mastoids are well pneumatized.  IMPRESSION: 1.  Compared to prior study dated 12/18/2011 the size of the small left subdural hematoma and small amount of left-sided subarachnoid hemorrhage have both decreased slightly. The appearance of the brain is otherwise unchanged, as above.  Original Report Authenticated By: Florencia Reasons, M.D.   Scheduled Meds:    . amiodarone  100 mg Oral Daily  . docusate sodium  100 mg Oral BID  . fluticasone  1 spray Each Nare Daily  . furosemide      . furosemide      . furosemide  40 mg Intravenous BID  . furosemide  40 mg Intravenous Once  . isosorbide mononitrate  30 mg Oral Daily  . loratadine  10 mg Oral Daily  . pantoprazole  40 mg Oral Q1200  . polyethylene glycol  17 g Oral BID  . potassium chloride  20 mEq Oral BID  . simvastatin  20 mg Oral q1800   Continuous Infusions:     Assessment/Plan: 1. Traumatic subdural hematoma, subarachnoid hemorrhage: Stable by CT. stable for discharge per neurosurgery. Followup with neurosurgery in 3 weeks with a CT brain at that time. No anticoagulation or antiplatelet agents until cleared by neurosurgery. 2. Left femoral neck fracture: Status post left hip hemiarthroplasty. Per orthopedics stable for discharge. 3. Posthemorrhagic anemia: Stable. Status post 2 units packed red blood cells. 4. Paroxysmal  atrial fibrillation: Stable. None warfarin candidate secondary to above. Amiodarone decreased because of bradycardia. 5. Coagulopathy secondary to Coumadin: Given FFP and vitamin K. 6. Compensated diastolic congestive heart failure: Stable. Continue Lasix, Imdur. 7. Hyponatremia: Stable. Followup as an outpatient. 8. Acute renal failure/Chronic kidney disease stage III: Acute renal failure appears to be resolved. 9. Thrombocytopenia: Resolved. Not on any heparin  products. Etiology unclear. Seen September 2012, August 2012. Follow up as an outpatient.  Discussed with daughter by telephone. Patient is stable for discharge. All questions answered to her apparent satisfaction.   Code Status: DO NOT RESUSCITATE Family Communication: None bedside Disposition Plan: Skilled nursing facility   Brendia Sacks, MD  [facility] Pager [phone]  If 7PM-7AM, please contact night-coverage [email or web address] Password [email or web address] 12/22/2011, 11:43 AM    LOS: 8 days

## 2011-12-22 NOTE — Progress Notes (Signed)
Physical Therapy Treatment Patient Details Name: [name] MRN: [id number] DOB: 1927/07/12 Today's Date: 12/22/2011  PT Assessment/Plan  PT - Assessment/Plan Comments on Treatment Session: Pt very pleasant & willing to participate in PT session.  Pain seems to be limiting factor.  Continues to require +2 Total (A) but able to put forth more effort today.     PT Frequency: Min 6X/week Follow Up Recommendations: Skilled nursing facility Equipment Recommended: Defer to next venue PT Goals  Acute Rehab PT Goals PT Goal: Supine/Side to Sit - Progress: Progressing toward goal PT Goal: Sit to Stand - Progress: Progressing toward goal PT Goal: Stand to Sit - Progress: Progressing toward goal PT Transfer Goal: Bed to Chair/Chair to Bed - Progress: Progressing toward goal PT Goal: Perform Home Exercise Program - Progress: Progressing toward goal  PT Treatment Precautions/Restrictions  Precautions Precautions: Posterior Hip Precaution Booklet Issued: No Precaution Comments: Pt very HOH;  Pt unable to recall any of the hip precautions- Reviewed/Re-educated all 3 precautions Required Braces or Orthoses: No Restrictions Weight Bearing Restrictions: Yes LLE Weight Bearing: Weight bearing as tolerated Mobility (including Balance) Bed Mobility Supine to Sit: 1: +2 Total assist Supine to Sit Details (indicate cue type and reason): pt=40%.  Max cues for sequencing, technique.  (A) for all components of transition.  Use of draw pad to facilitate pivoting of hips & lifting shoulders/trunk to sitting upright.  Sit to Supine: Not Tested (comment) Transfers Sit to Stand: 1: +2 Total assist;From bed;From chair/3-in-1;With upper extremity assist;With armrests Sit to Stand Details (indicate cue type and reason): pt=40%.  (A)/facilitation at hips to achieve upright position.  max cues for technique.  Pt with difficulty achieving full upright standing.   Stand to Sit: 1: +2 Total assist;To chair/3-in-1;With  upper extremity assist;With armrests Stand to Sit Details: pt=20%.  (A) to control descent & safety.  Max cues for tehcnique.   Stand Pivot Transfers: 1: +2 Total assist Stand Pivot Transfer Details (indicate cue type and reason): pt=40%.  Facilitation/(A) to rotate/pivot hips from bed>3-in1>recliner.  Max cues for technique & sequencing.   Ambulation/Gait Ambulation/Gait: No Stairs: No Wheelchair Mobility Wheelchair Mobility: No  Posture/Postural Control Posture/Postural Control: No significant limitations Balance Balance Assessed: No Exercise  Total Joint Exercises Ankle Circles/Pumps: AROM;Both;10 reps;Seated Gluteal Sets: AROM;Strengthening;Both;10 reps;Seated Long Arc Quad: AROM;Strengthening;Left;10 reps;Seated End of Session PT - End of Session Equipment Utilized During Treatment: Gait belt Activity Tolerance: Patient tolerated treatment well Patient left: in chair;with call bell in reach Nurse Communication: Mobility status for transfers General Behavior During Session: [facility] for tasks performed Cognition: Impaired Cognitive Impairment: Decreased problem solving & slow to process information.    Lara Mulch 12/22/2011, 1:36 PM [phone]

## 2011-12-22 NOTE — Progress Notes (Signed)
Filed Vitals:   12/21/11 0950 12/21/11 1448 12/21/11 2152 12/22/11 0636  BP: 118/64 150/74 146/54 155/80  Pulse: 74 94 75 67  Temp: 98.7 F (37.1 C) 97.9 F (36.6 C) 97 F (36.1 C) 96.7 F (35.9 C)  TempSrc: Oral Oral    Resp: 18 16 16 16   Height:      Weight:      SpO2: 94% 97% 96% 100%    CBC  Basename 12/22/11 0615 12/21/11 1005  WBC 5.9 7.6  HGB 9.6* 9.4*  HCT 28.2* 28.1*  PLT 181 106*   BMET  Basename 12/22/11 0615 12/21/11 1005  NA 130* 129*  K 4.3 4.1  CL 95* 98  CO2 27 21  GLUCOSE 101* 137*  BUN 12 11  CREATININE 1.11* 0.86  CALCIUM 8.7 8.5    Patient resting comfortably in bed, patient's daughter sitting at bedside. They report the patient had moderate agitated confusion yesterday and last night, which they treated to her narcotic analgesic (oxycodone), which may have also been contributed to by other factors including her hyponatremia and potentially sundowning.  Currently awake and alert, but oriented only to name. Following commands. Speech is fluent. Moving all 4 extremities, although movement left lower extremity limited secondary to recent hip fracture, status post repair.  CT brain done yesterday and shows slight decrease in thin left hemispheric subdural hematoma, and has no mass effect or shift.  Plan: Discuss the patient and her daughter that the patient will need to return for followup with me in the office in 3 weeks, presuming she's been discharge from the hospital in the interim. On the day of the appointment she will need a followup CT the brain without contrast. The patient started been given the office phone number, my secretary his name, and her extension, along with the above instructions.  Because of the residual intracranial hemorrhage, the patient will need to continue off of all anticoagulation and antiplatelet agents for the foreseeable future, and in the long run any consideration of the use of anticoagulation and antiplatelet agents  will need to balance the risks of future falls and further injuries versus their potential benefit.  Hewitt Shorts, MD 12/22/2011, 7:49 AM

## 2011-12-22 NOTE — Discharge Summary (Signed)
Physician Discharge Summary  [name] UJW:[id number] DOB: 10-20-1926 DOA: 12/14/2011  PCP: Olivia Ravel, MD, Ball  Admit date: 12/14/2011 Discharge date: 12/22/2011 Cardiologist: [name], M.D. (Heart failure clinic) Electrophysiologist: [name], M.D. Orthopedic surgeon: [name], M.D. Neurosurgeon: [name], M.D.  Discharge Diagnoses:  1. Traumatic subdural hematoma, subarachnoid hemorrhage 2. Left femoral neck fracture 3. Coagulopathy secondary to Coumadin e 4. Posthemorrhagic anemia 5. Paroxysmal atrial fibrillation 6. Compensated diastolic congestive heart failure 7. Hyponatremia, stable 8. Acute renal failure, resolved 9. Chronic kidney disease 6 10. Thrombocytopenia  Discharge Condition: Improved  Disposition: Skilled nursing facility.  History of present illness:  78 year old woman transferred from [facility] status post fall with traumatic subarachnoid hemorrhage, left hip fracture.   Hospital Course:  [name] was admitted by the trauma service and seen by cardiology, neurosurgery and orthopedics in consultation. Patient was cleared for hip surgery by neurosurgery and cardiology and successfully underwent left hip arthroplasty. Serial CAT scans of the head have shown some improvement in hemorrhage. She has remained clinically stable and has been cleared for discharge by the neurosurgery and orthopedic services. She is not a candidate for Coumadin, anti-thrombotic or antiplatelet agents for the near future at a minimum. 1. Traumatic subdural hematoma, subarachnoid hemorrhage: Stable by CT. Stable for discharge per neurosurgery. Followup with neurosurgery in 3 weeks with a CT brain at that time. No anticoagulation or antiplatelet agents until cleared by neurosurgery.  2. Left femoral neck fracture: Status post left hip hemiarthroplasty. Per orthopedics stable for discharge.  3. Posthemorrhagic anemia: Stable. Status post 2 units packed red blood  cells.  4. Paroxysmal atrial fibrillation: Stable. None warfarin candidate secondary to above. Amiodarone decreased because of bradycardia.  5. Coagulopathy secondary to Coumadin: Given FFP and vitamin K.  6. Compensated diastolic congestive heart failure: Stable. Continue Lasix, Imdur.  7. Hyponatremia: Stable. Followup as an outpatient.  8. Acute renal failure/Chronic kidney disease stage III: Acute renal failure appears to be resolved.  9. Thrombocytopenia: Resolved. Not on any heparin products. Etiology unclear. Seen September 2012, August 2012. Follow up as an outpatient.  Consultants:  Admitted by the trauma service   Neurosurgery   Cardiology   Orthopedics   Physical medicine and rehabilitation   Physical therapy: Skilled nursing facility  Procedures:  April 5: Left hip arthroplasty  Discharge Instructions  Discharge Orders    Future Appointments: Provider: Department: Dept Phone: Ball:   01/07/2012 10:45 AM [name] [facility] [phone] [facility]     Future Orders Please Complete By Expires   Diet - low sodium heart healthy      Increase activity slowly      Discharge wound care:      Comments:   Per [name]'s office (orthopedics).     Medication List  As of 12/22/2011 12:52 PM   STOP taking these medications         alendronate 10 MG tablet      aspirin 81 MG tablet      fenofibrate micronized 134 MG capsule      HYDROcodone-acetaminophen 5-500 MG per tablet      warfarin 1 MG tablet         TAKE these medications         acetaminophen 500 MG tablet   Commonly known as: TYLENOL   Take 500 mg by mouth 2 (two) times daily as needed. For pain fever      amiodarone 100 MG tablet   Commonly  known as: PACERONE   Take 1 tablet (100 mg total) by mouth daily.      cetirizine 10 MG tablet   Commonly known as: ZYRTEC   Take 10 mg by mouth daily.      furosemide 20 MG tablet   Commonly known as: LASIX   Take 2 tablets  (40 mg total) by mouth daily.      isosorbide mononitrate 30 MG 24 hr tablet   Commonly known as: IMDUR   Take 60 mg by mouth daily.      lovastatin 40 MG tablet   Commonly known as: MEVACOR   Take 40 mg by mouth at bedtime.      mometasone 50 MCG/ACT nasal spray   Commonly known as: NASONEX   Place 2 sprays into the nose daily.      nitroGLYCERIN 0.4 MG SL tablet   Commonly known as: NITROSTAT   Place 0.4 mg under the tongue every 5 (five) minutes as needed. For chest pain      oxyCODONE 5 MG immediate release tablet   Commonly known as: Oxy IR/ROXICODONE   Take 1-2 tablets (5-10 mg total) by mouth every 4 (four) hours as needed.      pantoprazole 40 MG tablet   Commonly known as: PROTONIX   Take 1 tablet (40 mg total) by mouth daily.      polyethylene glycol packet   Commonly known as: MIRALAX / GLYCOLAX   Take 17 g by mouth 2 (two) times daily.      potassium chloride SA 20 MEQ tablet   Commonly known as: K-DUR,KLOR-CON   Take 20 mEq by mouth daily.           Follow-up Information    Follow up with [facility], [facility] in 2 weeks.   Contact information:   [name] [address] [place] [address] [phone]       Follow up with Olivia Shorts, Ball. Schedule an appointment as soon as possible for a visit in 3 weeks.   Contact information:   [address], [address] [place] [address] [phone]       Schedule an appointment as soon as possible for a visit with Olivia Mustard, Ball.   Contact information:   [address] [place] [address] [phone]          The results of significant diagnostics from this hospitalization (including imaging, microbiology, ancillary and laboratory) are listed below for reference.    Significant Diagnostic Studies: Ct Head Wo Contrast  12/21/2011  *RADIOLOGY REPORT*  Clinical Data: Follow-up for subdural hematoma.  CT HEAD WITHOUT  CONTRAST  Technique:  Contiguous axial images were obtained from the base of the skull through the vertex without contrast.  Comparison: Multiple prior head CTs, most recently 12/18/2011.  Findings: Study is slightly limited by large amount of gross patient motion.  There continues to be a small extra-axial high attenuation fluid collection overlying the left cerebral hemisphere laterally compatible with a small subdural hematoma, which is decreased.  There continues to be a small amount of high attenuation material associated with the sulci overlying the left cerebral hemisphere near the vertex, compatible with resolving subarachnoid hemorrhage.  No new abnormal intra or extra-axial fluid collections are identified.  There is a background of mild cerebral and cerebellar atrophy.  There is a small amount of decreased attenuation in the deep and periventricular Gehlhausen matter of the cerebral hemispheres bilaterally, compatible  with chronic microvascular ischemic disease (unchanged).  No acute displaced skull fractures are identified.  Visualized paranasal sinuses and mastoids are well pneumatized.  IMPRESSION: 1.  Compared to prior study dated 12/18/2011 the size of the small left subdural hematoma and small amount of left-sided subarachnoid hemorrhage have both decreased slightly. The appearance of the brain is otherwise unchanged, as above.  Original Report Authenticated By: Florencia Reasons, M.D.   Microbiology: Recent Results (from the past 240 hour(s))  MRSA PCR SCREENING     Status: Normal   Collection Time   12/14/11  7:26 PM      Component Value Range Status Comment   MRSA by PCR NEGATIVE  NEGATIVE  Final      Labs: Basic Metabolic Panel:  Lab 12/22/11 1610 12/21/11 1005 12/20/11 0500 12/19/11 0600 12/18/11 0545  NA 130* 129* 128* 130* 131*  K 4.3 4.1 -- -- --  CL 95* 98 96 100 99  CO2 27 21 23 25 26   GLUCOSE 101* 137* 87 106* 91  BUN 12 11 13 17 20   CREATININE 1.11* 0.86 0.92 1.02 1.14*    CALCIUM 8.7 8.5 8.2* 8.1* 8.3*  MG -- -- -- -- --  PHOS -- -- -- -- --   CBC:  Lab 12/22/11 0615 12/21/11 1005 12/20/11 0500 12/19/11 0600 12/18/11 0900  WBC 5.9 7.6 6.4 5.8 5.6  NEUTROABS -- -- -- -- --  HGB 9.6* 9.4* 8.9* 7.3* 9.6*  HCT 28.2* 28.1* 26.5* 22.1* 28.8*  MCV 88.1 87.5 87.2 88.0 87.0  PLT 181 106* 69* PLATELET CLUMPS NOTED ON SMEAR, UNABLE TO ESTIMATE PLATELET CLUMPING, SUGGEST RECOLLECTION OF SAMPLE IN CITRATE TUBE.   CBG:  Lab 12/21/11 1634 12/21/11 1135  GLUCAP 102* 121*    Time coordinating discharge: 35 minutes.  Signed:  Brendia Sacks, Ball  [facility] 12/22/2011, 12:52 PM

## 2011-12-22 NOTE — Progress Notes (Addendum)
Advanced Heart Failure Rounding Note   Subjective:   Admitted 4/1 with Traumatic subdural hematoma, subarachnoid hemorrhage and left hip fracture secondary to fall.  CT of head stable, followed by neuro.  POD #4 s/p L hip arthroplasty.  Admission weight 161 pounds.  ?accuracy of today's weight, up another 9 pounds from yesterday.  She has urinary incontinence so I/Os not accurate but increased urination yesterday per daughter. +BM this morning  Dr. Lajoyce Corners removed immobilizer and leg pain improved some.  Mild dyspnea and nonproductive cough this morning. Denies PND/Orthopnea.  Increased agitation.    Objective:   Weight Range:  Vital Signs:   Temp:  [96.7 F (35.9 C)-98.7 F (37.1 C)] 96.7 F (35.9 C) (04/09 0636) Pulse Rate:  [67-94] 67  (04/09 0636) Resp:  [16-18] 16  (04/09 0636) BP: (118-155)/(54-80) 155/80 mmHg (04/09 0636) SpO2:  [94 %-100 %] 100 % (04/09 0636) Last BM Date: 12/21/11  Weight change: Filed Weights   12/14/11 2200  Weight: 73.2 kg (161 lb 6 oz)    Intake/Output:   Intake/Output Summary (Last 24 hours) at 12/22/11 0804 Last data filed at 12/22/11 0500  Gross per 24 hour  Intake    960 ml  Output      0 ml  Net    960 ml  **Inaccurate due to urinary incontinence**  Physical Exam: General: Chronically ill  appearing. No resp difficulty HEENT: normal Neck: supple. JVP 7-8 . Carotids 2+ bilat; no bruits. No lymphadenopathy or thryomegaly appreciated. Cor: PMI nondisplaced. Regular rate & rhythm. No rubs, gallops or murmurs. Lungs: clear Abdomen: soft, nontender, mildly distended. No hepatosplenomegaly. No bruits or masses. Good bowel sounds. Extremities: no cyanosis, clubbing, rash, trace edema LLE. L hip VAC in place Neuro: alert & orientedx3, cranial nerves grossly intact. moves all 4 extremities w/o difficulty. Affect pleasant  Telemetry: none  Labs: Basic Metabolic Panel:  Lab 12/22/11 0454 12/21/11 1005 12/20/11 0500 12/19/11 0600 12/18/11 0545   NA 130* 129* 128* 130* 131*  K 4.3 4.1 4.4 4.9 4.7  CL 95* 98 96 100 99  CO2 27 21 23 25 26   GLUCOSE 101* 137* 87 106* 91  BUN 12 11 13 17 20   CREATININE 1.11* 0.86 0.92 1.02 1.14*  CALCIUM 8.7 8.5 8.2* -- --  MG -- -- -- -- --  PHOS -- -- -- -- --    Liver Function Tests: No results found for this basename: AST:5,ALT:5,ALKPHOS:5,BILITOT:5,PROT:5,ALBUMIN:5 in the last 168 hours No results found for this basename: LIPASE:5,AMYLASE:5 in the last 168 hours No results found for this basename: AMMONIA:3 in the last 168 hours  CBC:  Lab 12/22/11 0615 12/21/11 1005 12/20/11 0500 12/19/11 0600 12/18/11 0900  WBC 5.9 7.6 6.4 5.8 5.6  NEUTROABS -- -- -- -- --  HGB 9.6* 9.4* 8.9* 7.3* 9.6*  HCT 28.2* 28.1* 26.5* 22.1* 28.8*  MCV 88.1 87.5 87.2 88.0 87.0  PLT 181 106* 69* PLATELET CLUMPS NOTED ON SMEAR, UNABLE TO ESTIMATE PLATELET CLUMPING, SUGGEST RECOLLECTION OF SAMPLE IN CITRATE TUBE.    Cardiac Enzymes: No results found for this basename: CKTOTAL:5,CKMB:5,CKMBINDEX:5,TROPONINI:5 in the last 168 hours  BNP: BNP (last 3 results)  Basename 11/21/11 0630 11/19/11 2102 07/16/11 1140  PROBNP 12835.0* 8854.0* 444.0*     Other results:  EKG:   Imaging: Ct Head Wo Contrast  12/21/2011  *RADIOLOGY REPORT*  Clinical Data: Follow-up for subdural hematoma.  CT HEAD WITHOUT CONTRAST  Technique:  Contiguous axial images were obtained from the base of the  skull through the vertex without contrast.  Comparison: Multiple prior head CTs, most recently 12/18/2011.  Findings: Study is slightly limited by large amount of gross patient motion.  There continues to be a small extra-axial high attenuation fluid collection overlying the left cerebral hemisphere laterally compatible with a small subdural hematoma, which is decreased.  There continues to be a small amount of high attenuation material associated with the sulci overlying the left cerebral hemisphere near the vertex, compatible with resolving  subarachnoid hemorrhage.  No new abnormal intra or extra-axial fluid collections are identified.  There is a background of mild cerebral and cerebellar atrophy.  There is a small amount of decreased attenuation in the deep and periventricular Brunell matter of the cerebral hemispheres bilaterally, compatible with chronic microvascular ischemic disease (unchanged).  No acute displaced skull fractures are identified.  Visualized paranasal sinuses and mastoids are well pneumatized.  IMPRESSION: 1.  Compared to prior study dated 12/18/2011 the size of the small left subdural hematoma and small amount of left-sided subarachnoid hemorrhage have both decreased slightly. The appearance of the brain is otherwise unchanged, as above.  Original Report Authenticated By: Florencia Reasons, M.D.     Medications:     Scheduled Medications:    . amiodarone  100 mg Oral Daily  . docusate sodium  100 mg Oral BID  . fluticasone  1 spray Each Nare Daily  . furosemide      . furosemide  40 mg Intravenous BID  . isosorbide mononitrate  30 mg Oral Daily  . loratadine  10 mg Oral Daily  . pantoprazole  40 mg Oral Q1200  . polyethylene glycol  17 g Oral BID  . potassium chloride  20 mEq Oral BID  . simvastatin  20 mg Oral q1800    Infusions:    PRN Medications: acetaminophen, morphine, nitroGLYCERIN, ondansetron (ZOFRAN) IV, ondansetron, oxyCODONE   Assessment:   1) Fracture of left hip secondary to dizziness and a fall s/p lt hip arthroplast.  2) Subdural hematoma and subarachnoid hemorrhage secondary to fall, coumadin discontinued. 3) Acute on chronic diastolic HF   4) Paroxysmal atrial fib, currently in NSR  5) Acute on chronic renal failure  6) Past history of NSTEMI type II in Sept 2012      - normal lexiscan 05/01/11 with EF 62%  7) Previous long-term warfarin, presently reversed. INR 1.08  8) Posthemorrhagic Anemia: stable Hgb 9.6 9) Hyponatremia  Plan/Discussion:    Volume status appears  mildly elevated, will give extra dose of IV lasix 40 mg this morning and reassess.  Will follow renal function closely as Cr is mildly elevated today.  Weights and I/Os appear inaccurate due to bed scale and urinary incontinence.  Patient remains in NSR, no anticoagulation due to subdural hematoma and subarachnoid hemorrhage.  Have discussed this with the daughter and patient, they understand risk and benefits.    Length of Stay: 8 Robbi Garter 12/22/2011, 8:04 AM  Patient seen with PA, agree with the above note.  Volume status looks ok today.  She denies dyspnea.  Noted plan to discharge to SNF today.  Think this is reasonable.  Would treat with Lasix 40 mg daily + 20 KCl at discharge.  No ASA until cleared by neurosurgery.  Followup CHF clinic.   Marca Ancona 12/22/2011 1:11 PM

## 2011-12-23 LAB — TYPE AND SCREEN
ABO/RH(D): O POS
Antibody Screen: POSITIVE
Unit division: 0

## 2012-01-07 ENCOUNTER — Ambulatory Visit: Payer: Medicare Other | Admitting: Internal Medicine

## 2012-01-12 ENCOUNTER — Encounter (HOSPITAL_COMMUNITY): Payer: Self-pay

## 2012-01-12 ENCOUNTER — Ambulatory Visit (HOSPITAL_COMMUNITY)
Admission: RE | Admit: 2012-01-12 | Discharge: 2012-01-12 | Disposition: A | Discharge status: Home / Self Care | Payer: Medicare Other | Source: Ambulatory Visit | Attending: Neurosurgery | Admitting: Neurosurgery

## 2012-01-12 DIAGNOSIS — S0990XA Unspecified injury of head, initial encounter: Secondary | ICD-10-CM | POA: Insufficient documentation

## 2012-01-12 DIAGNOSIS — R29898 Other symptoms and signs involving the musculoskeletal system: Secondary | ICD-10-CM | POA: Insufficient documentation

## 2012-01-12 DIAGNOSIS — S065X9A Traumatic subdural hemorrhage with loss of consciousness of unspecified duration, initial encounter: Secondary | ICD-10-CM

## 2012-01-12 DIAGNOSIS — W19XXXA Unspecified fall, initial encounter: Secondary | ICD-10-CM | POA: Insufficient documentation

## 2012-02-09 ENCOUNTER — Ambulatory Visit (INDEPENDENT_AMBULATORY_CARE_PROVIDER_SITE_OTHER): Payer: Medicare Other | Admitting: Internal Medicine

## 2012-02-09 ENCOUNTER — Encounter: Payer: Self-pay | Admitting: Internal Medicine

## 2012-02-09 VITALS — BP 134/76 | HR 60 | Ht 65.0 in | Wt 158.0 lb

## 2012-02-09 DIAGNOSIS — I4891 Unspecified atrial fibrillation: Secondary | ICD-10-CM

## 2012-02-09 DIAGNOSIS — I480 Paroxysmal atrial fibrillation: Secondary | ICD-10-CM

## 2012-02-09 DIAGNOSIS — I5032 Chronic diastolic (congestive) heart failure: Secondary | ICD-10-CM

## 2012-02-09 DIAGNOSIS — D62 Acute posthemorrhagic anemia: Secondary | ICD-10-CM

## 2012-02-09 NOTE — Patient Instructions (Signed)
Your physician wants you to follow-up in: 6 months with Dr TAylor You will receive a reminder letter in the mail two months in advance. If you don't receive a letter, please call our office to schedule the follow-up appointment.  

## 2012-02-15 ENCOUNTER — Encounter: Payer: Self-pay | Admitting: Internal Medicine

## 2012-02-15 NOTE — Progress Notes (Signed)
HPI Mrs. Moller returns today for followup. She is a very pleasant 78 year old woman with a history of symptomatic atrial fibrillation, who is been well-controlled on amiodarone therapy. She has not thought to be a candidate for systemic anticoagulation. She also has diastolic heart failure but is been fairly well compensated. She takes 20-40 mg of Lasix daily as needed. In the interim she has been stable. She denies chest pain or shortness of breath. No Known Allergies   Current Outpatient Prescriptions  Medication Sig Dispense Refill  . acetaminophen (TYLENOL) 500 MG tablet Take 500 mg by mouth 2 (two) times daily as needed. For pain fever      . amiodarone (PACERONE) 100 MG tablet Take 1 tablet (100 mg total) by mouth daily.      . cetirizine (ZYRTEC) 10 MG tablet Take 10 mg by mouth daily.       . citalopram (CELEXA) 10 MG tablet Take 10 mg by mouth daily.      . DiphenhydrAMINE HCl (BENADRYL PO) Take 12.5 mg by mouth as directed.      . Famotidine (PEPCID PO) Take by mouth as directed.      . furosemide (LASIX) 20 MG tablet Take 2 tablets (40 mg total) by mouth daily.  30 tablet  0  . isosorbide mononitrate (IMDUR) 30 MG 24 hr tablet Take 60 mg by mouth daily.      . mometasone (NASONEX) 50 MCG/ACT nasal spray Place 2 sprays into the nose daily.  17 g  0  . nitroGLYCERIN (NITROSTAT) 0.4 MG SL tablet Place 0.4 mg under the tongue every 5 (five) minutes as needed. For chest pain      . oxyCODONE (ROXICODONE) 5 MG immediate release tablet Take 5 mg by mouth every 4 (four) hours as needed.      . pantoprazole (PROTONIX) 40 MG tablet Take 1 tablet (40 mg total) by mouth daily.  30 tablet  6  . potassium chloride SA (K-DUR,KLOR-CON) 20 MEQ tablet Take 20 mEq by mouth daily.      . traZODone (DESYREL) 50 MG tablet Take 50 mg by mouth at bedtime.         Past Medical History  Diagnosis Date  . Thrombocytopenia   . Sleep related leg cramps   . Unspecified hearing loss   . HTN (hypertension)     . Lumbar pain   . Osteopenia   . CKD (chronic kidney disease), stage III   . Chronic constipation   . Unspecified urinary incontinence   . Hyponatremia   . Mitral valve disorders   . Disturbance of skin sensation   . Sciatica   . Unspecified cataract   . Lumbosacral spondylosis without myelopathy   . Edema   . HLD (hyperlipidemia)   . Other B-complex deficiencies   . Unspecified vitamin D deficiency   . Colon cancer 1961  . Ovarian cancer 1970  . History of kidney cancer 2005  . Atrial fibrillation     amiodarone; coumadin  . NSTEMI (non-ST elevated myocardial infarction)     in setting of hypoxic resp failure 9/12: ? type 2 NSTEMI vs. true ischemia (med therapy chosen)  . Chronic diastolic heart failure     Echo 8/12: Moderate LVH, EF 55-60%, mild MR, mild BAE, PASP 60. Myoview 8/12: Negative for ischemia, normal LV function.   . DNR (do not resuscitate)   . Morbid obesity   . Thrombocytopenia   . Hearing loss   . Vitamin E deficiency   .  Chronic renal insufficiency, stage III (moderate)   . DJD (degenerative joint disease)     ROS:   All systems reviewed and negative except as noted in the HPI.   Past Surgical History  Procedure Date  . Appendectomy 1945  . Ankle surgery     Pin placed and later removed  . 2-d echocardiogram 04/30/2011  . Negative myoview stress test 05/07/11    for ischemia or infarction. EF of 62%  . Hip arthroplasty 12/18/2011    Procedure: ARTHROPLASTY BIPOLAR HIP;  Surgeon: Nadara Mustard, MD;  [facility]: [facility] OR;  Service: Orthopedics;  Laterality: Left;  Left Hip Hemiarthroplasty  . Cardiac catheterization 07/08/11     Family History  Problem Relation Age of Onset  . Heart attack Son      History   Social History  . Marital Status: Widowed    Spouse Name: N/A    Number of Children: N/A  . Years of Education: N/A   Occupational History  . Not on file.   Social History Main Topics  . Smoking status: Never Smoker   . Smokeless  tobacco: Current User    Types: Snuff  . Alcohol Use: No  . Drug Use: No  . Sexually Active: Not on file   Other Topics Concern  . Not on file   Social History Narrative   Lives at home with daughter close by. [name] [phone].      BP 134/76  Pulse 60  Ht 5\' 5"  (1.651 m)  Wt 158 lb (71.668 kg)  BMI 26.29 kg/m2  Physical Exam:  Well appearing elderly woman, NAD HEENT: Unremarkable Neck:  No JVD, no thyromegally Lungs:  Clear except for scattered basilar rales. No wheezes and no rhonchi are present. HEART:  Regular rate rhythm, no murmurs, no rubs, no clicks Abd:  soft, positive bowel sounds, no organomegally, no rebound, no guarding Ext:  2 plus pulses, no edema, no cyanosis, no clubbing Skin:  No rashes no nodules Neuro:  CN II through XII intact, motor grossly intact  Assess/Plan:

## 2012-02-15 NOTE — Assessment & Plan Note (Signed)
She is maintaining sinus rhythm very nicely on amiodarone therapy. She will continue her current medical therapy for now.

## 2012-02-15 NOTE — Assessment & Plan Note (Signed)
She remains stable now that she is off of systemic anticoagulation. She is not a candidate for Coumadin at this time.

## 2012-02-15 NOTE — Assessment & Plan Note (Signed)
Her heart failure symptoms are class II. She will continue her current medical therapy and maintain a low-sodium diet.

## 2012-03-07 ENCOUNTER — Ambulatory Visit: Payer: Self-pay

## 2012-03-07 DIAGNOSIS — S065X9A Traumatic subdural hemorrhage with loss of consciousness of unspecified duration, initial encounter: Secondary | ICD-10-CM

## 2012-03-07 DIAGNOSIS — Z7901 Long term (current) use of anticoagulants: Secondary | ICD-10-CM

## 2012-03-07 DIAGNOSIS — I480 Paroxysmal atrial fibrillation: Secondary | ICD-10-CM

## 2012-03-29 ENCOUNTER — Ambulatory Visit: Payer: Medicare Other | Admitting: Pulmonary Disease

## 2012-06-21 ENCOUNTER — Ambulatory Visit (INDEPENDENT_AMBULATORY_CARE_PROVIDER_SITE_OTHER): Payer: Medicare Other | Admitting: Internal Medicine

## 2012-06-21 ENCOUNTER — Encounter: Payer: Self-pay | Admitting: Internal Medicine

## 2012-06-21 VITALS — BP 140/62 | HR 57 | Ht 65.5 in | Wt 147.0 lb

## 2012-06-21 DIAGNOSIS — I480 Paroxysmal atrial fibrillation: Secondary | ICD-10-CM

## 2012-06-21 DIAGNOSIS — I5032 Chronic diastolic (congestive) heart failure: Secondary | ICD-10-CM

## 2012-06-21 DIAGNOSIS — S72009A Fracture of unspecified part of neck of unspecified femur, initial encounter for closed fracture: Secondary | ICD-10-CM

## 2012-06-21 DIAGNOSIS — I4891 Unspecified atrial fibrillation: Secondary | ICD-10-CM

## 2012-06-21 NOTE — Assessment & Plan Note (Signed)
She has persistent hip pain. Hopefully this will improve.

## 2012-06-21 NOTE — Assessment & Plan Note (Signed)
She appears to be maintaining sinus rhythm on low-dose amiodarone. We will continue her current medical therapy; she is not a candidate for anticoagulation.

## 2012-06-21 NOTE — Assessment & Plan Note (Signed)
Her symptoms are class II. No change in medical therapy. I've encouraged the patient to maintain a low-sodium diet.

## 2012-06-21 NOTE — Progress Notes (Signed)
HPI [name] returns today for followup. She is a very pleasant 78 year old woman who is a history of paroxysmal atrial fibrillation, a subdural hematoma, dyslipidemia, severe arthritis, and diastolic heart failure. In the interim, patient has had problems with pain in her back and her hip. She denies palpitations. She is not a candidate for systemic anticoagulation secondary to her prior subdural hematoma. No Known Allergies   Current Outpatient Prescriptions  Medication Sig Dispense Refill  . amiodarone (PACERONE) 100 MG tablet Take 1 tablet (100 mg total) by mouth daily.      . citalopram (CELEXA) 10 MG tablet Take 10 mg by mouth daily.      . furosemide (LASIX) 20 MG tablet Take 20 mg by mouth daily.      Marland Kitchen guaiFENesin (MUCINEX) 600 MG 12 hr tablet Take 600 mg by mouth every 12 (twelve) hours.      . isosorbide mononitrate (IMDUR) 30 MG 24 hr tablet Take 60 mg by mouth daily.      . lansoprazole (PREVACID) 30 MG capsule Take 30 mg by mouth daily.      Marland Kitchen loratadine (CLARITIN) 10 MG tablet Take 10 mg by mouth daily.      . mometasone (NASONEX) 50 MCG/ACT nasal spray Place 2 sprays into the nose daily.  17 g  0  . potassium chloride SA (K-DUR,KLOR-CON) 20 MEQ tablet Take 20 mEq by mouth daily.      . pravastatin (PRAVACHOL) 40 MG tablet Take 40 mg by mouth daily.      . traZODone (DESYREL) 50 MG tablet Take 50 mg by mouth at bedtime.      Marland Kitchen DISCONTD: furosemide (LASIX) 20 MG tablet Take 2 tablets (40 mg total) by mouth daily.  30 tablet  0  . pantoprazole (PROTONIX) 40 MG tablet Take 1 tablet (40 mg total) by mouth daily.  30 tablet  6     Past Medical History  Diagnosis Date  . Thrombocytopenia   . Sleep related leg cramps   . Unspecified hearing loss   . HTN (hypertension)   . Lumbar pain   . Osteopenia   . CKD (chronic kidney disease), stage III   . Chronic constipation   . Unspecified urinary incontinence   . Hyponatremia   . Mitral valve disorders   . Disturbance of skin  sensation   . Sciatica   . Unspecified cataract   . Lumbosacral spondylosis without myelopathy   . Edema   . HLD (hyperlipidemia)   . Other B-complex deficiencies   . Unspecified vitamin D deficiency   . Colon cancer 1961  . Ovarian cancer 1970  . History of kidney cancer 2005  . Atrial fibrillation     amiodarone; coumadin  . NSTEMI (non-ST elevated myocardial infarction)     in setting of hypoxic resp failure 9/12: ? type 2 NSTEMI vs. true ischemia (med therapy chosen)  . Chronic diastolic heart failure     Echo 8/12: Moderate LVH, EF 55-60%, mild MR, mild BAE, PASP 60. Myoview 8/12: Negative for ischemia, normal LV function.   . DNR (do not resuscitate)   . Morbid obesity   . Thrombocytopenia   . Hearing loss   . Vitamin E deficiency   . Chronic renal insufficiency, stage III (moderate)   . DJD (degenerative joint disease)     ROS:   All systems reviewed and negative except as noted in the HPI.   Past Surgical History  Procedure Date  . Appendectomy 1945  .  Ankle surgery     Pin placed and later removed  . 2-d echocardiogram 04/30/2011  . Negative myoview stress test 05/07/11    for ischemia or infarction. EF of 62%  . Hip arthroplasty 12/18/2011    Procedure: ARTHROPLASTY BIPOLAR HIP;  Surgeon: Nadara Mustard, MD;  Location: [facility] OR;  Service: Orthopedics;  Laterality: Left;  Left Hip Hemiarthroplasty  . Cardiac catheterization 07/08/11     Family History  Problem Relation Age of Onset  . Heart attack Son      History   Social History  . Marital Status: Widowed    Spouse Name: N/A    Number of Children: N/A  . Years of Education: N/A   Occupational History  . Not on file.   Social History Main Topics  . Smoking status: Never Smoker   . Smokeless tobacco: Current User    Types: Snuff  . Alcohol Use: No  . Drug Use: No  . Sexually Active: Not on file   Other Topics Concern  . Not on file   Social History Narrative   Lives at home with daughter  close by. Daughter [name] [phone].      BP 140/62  Pulse 57  Ht 5' 5.5" (1.664 m)  Wt 147 lb (66.679 kg)  BMI 24.09 kg/m2  Physical Exam:  Well appearing elderly woman, NAD HEENT: Unremarkable Neck:  7 cm JVD, no thyromegally Lungs:  Clear with no wheezes, rales, or rhonchi. HEART:  Regular bradycardia rhythm, no murmurs, no rubs, no clicks Abd:  soft, positive bowel sounds, no organomegally, no rebound, no guarding Ext:  2 plus pulses, no edema, no cyanosis, no clubbing Skin:  No rashes no nodules Neuro:  CN II through XII intact, motor grossly intact  EKG Sinus bradycardia with nonspecific intraventricular conduction delay  Assess/Plan:

## 2012-06-21 NOTE — Patient Instructions (Signed)
Your physician wants you to follow-up in: 6 months with Dr Taylor You will receive a reminder letter in the mail two months in advance. If you don't receive a letter, please call our office to schedule the follow-up appointment.  

## 2012-08-09 ENCOUNTER — Ambulatory Visit: Payer: Medicare Other | Admitting: Internal Medicine

## 2013-04-30 IMAGING — CR DG CHEST DECUBITUS*R*
1 series · 1 of 1 positions shown · non-contrast
Comparison: 05/16/2011 and priors

CLINICAL DATA: 84-year-old female with pleural effusion.
Respiratory failure.

CHEST - RIGHT DECUBITUS

[w chest decub]
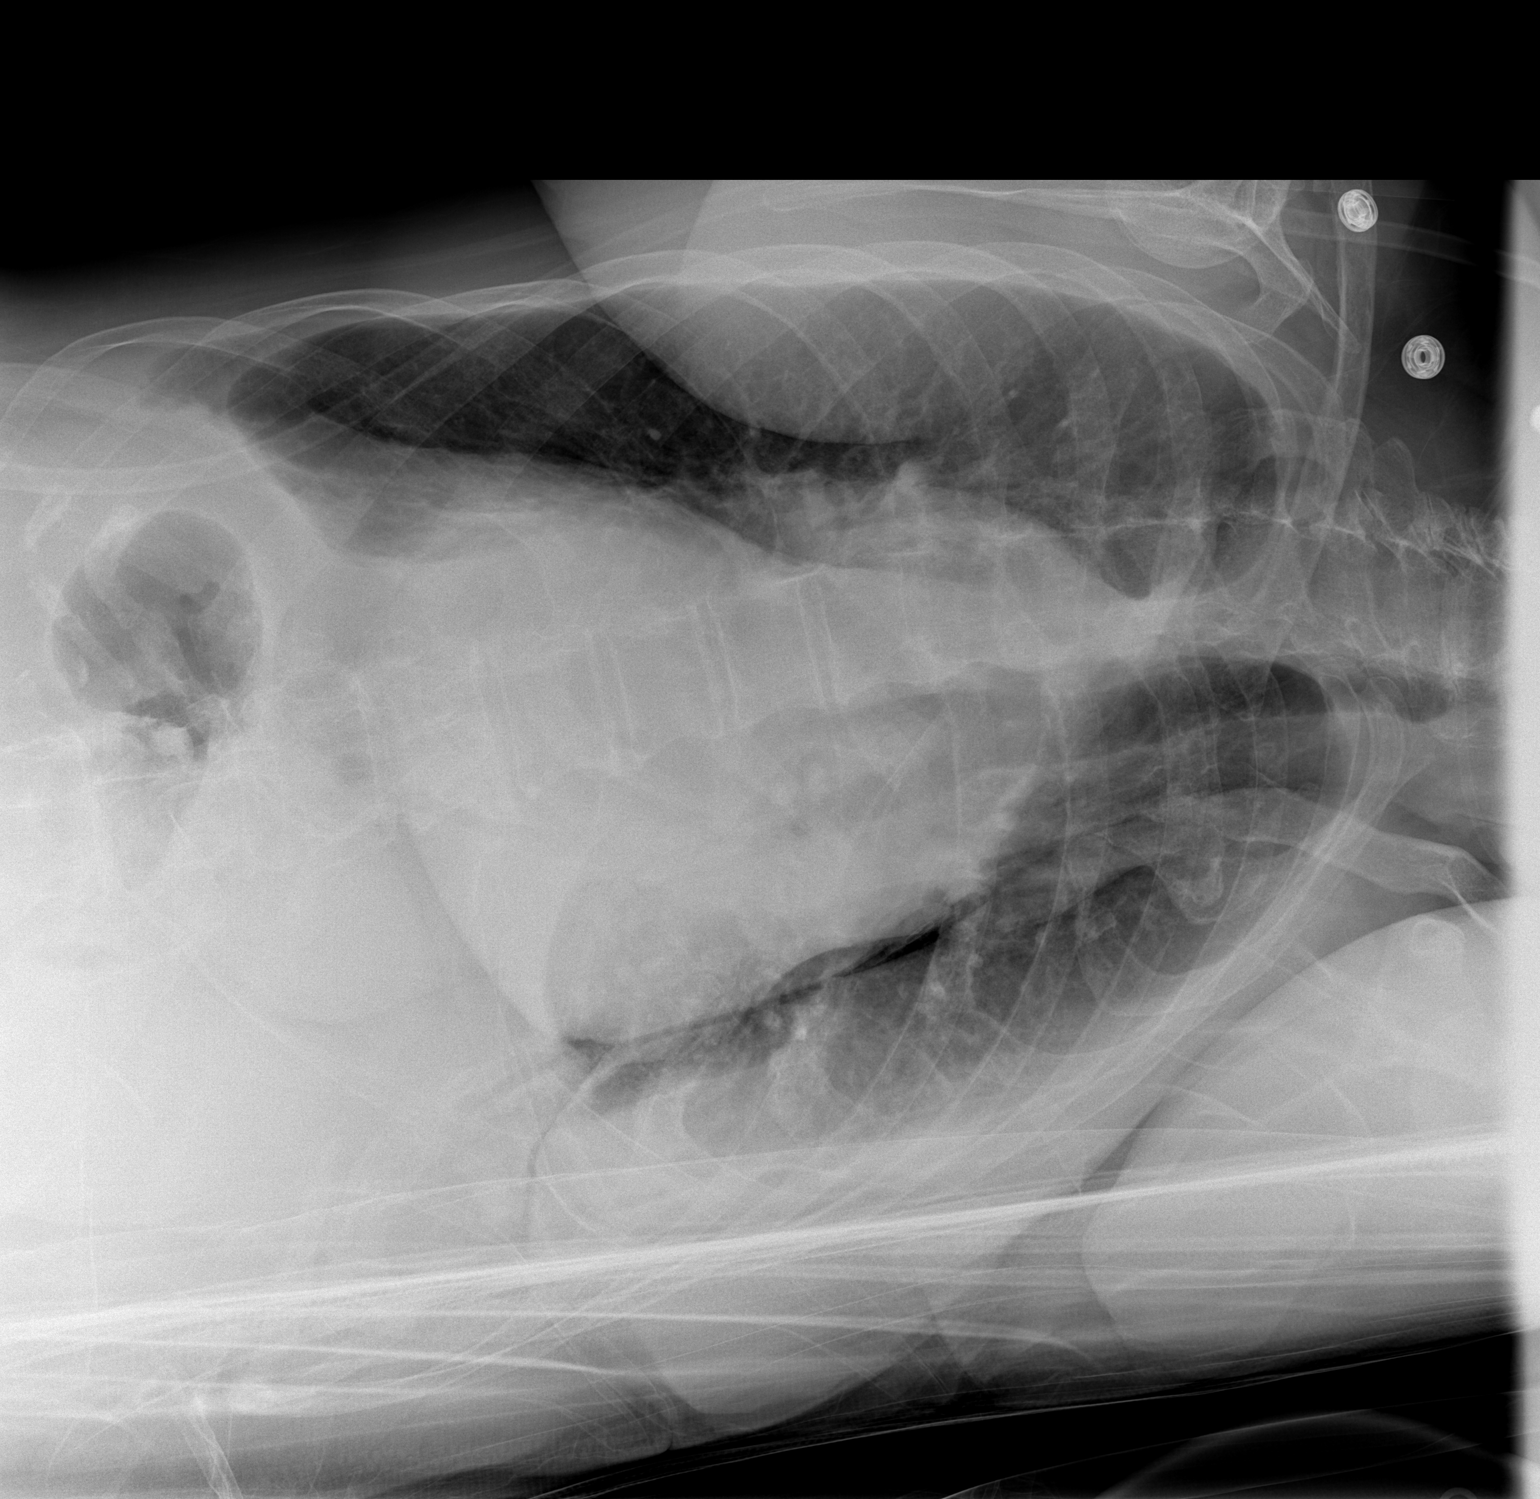

[1 of 1 positions shown; findings below may reference images not displayed]

FINDINGS: A small free flowing right pleural effusion is noted.
A probable small free flowing left pleural effusion is also
identified.
No changes are noted.
IMPRESSION: Small free flowing right pleural effusion.

Probable small free flowing left pleural effusion.

## 2013-10-17 ENCOUNTER — Telehealth: Payer: Self-pay | Admitting: Internal Medicine

## 2013-10-17 NOTE — Telephone Encounter (Signed)
New problem         Please give  NP Miss Blevins a call in her cell in regards to patient medication.  Pt is on Amiobarone 100 mg daily she has had worsening sycosis since stroke 3 months ago.  She has failed multiple medications considering starting Seraquil but would require stopping Amiobarone do to drug interaction. Family goal for care is comfort.  Please call Miss Burt Ek NP on cell  # [phone]

## 2013-10-17 NOTE — Telephone Encounter (Signed)
Discussed with Dr Lovena Le, may stop Amiodarone and put on Lopressor as patient is palliative care

## 2013-11-12 DEATH — deceased

## 2013-11-29 IMAGING — CT CT HEAD W/O CM
1 of 2 series · 13 of 30 positions shown, 17 images · non-contrast
Comparison: 12/14/2011.

CLINICAL DATA: Followup subarachnoid hemorrhage.

CT HEAD WITHOUT CONTRAST
TECHNIQUE: Contiguous axial images were obtained from the base of
the skull through the vertex without contrast.

[Series 2: brain · axial · 0.46mm/px · z∈[+85,+216]mm · 13 of 28 slices shown, 17 images]
[im 2/28  brain]
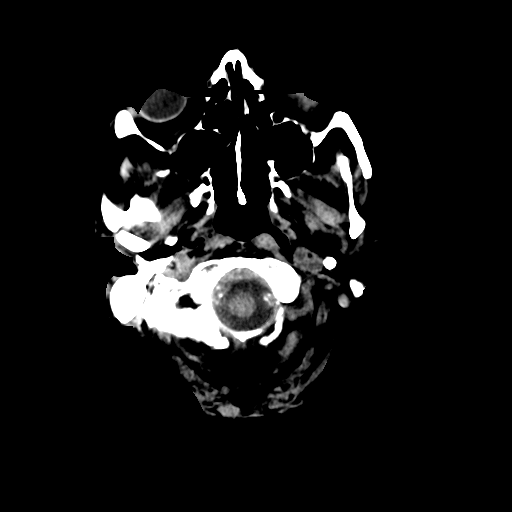
[im 2/28  bone]
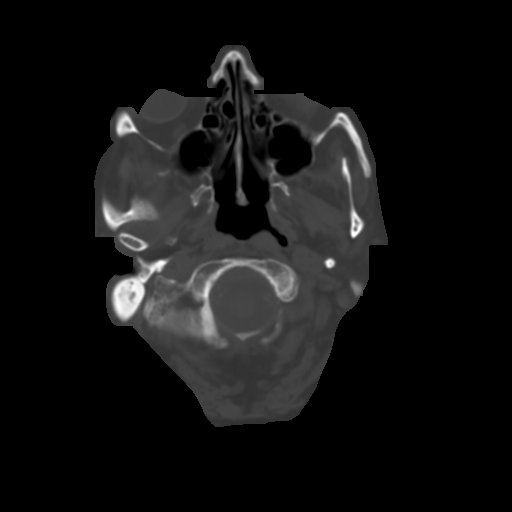
[im 4/28  brain]
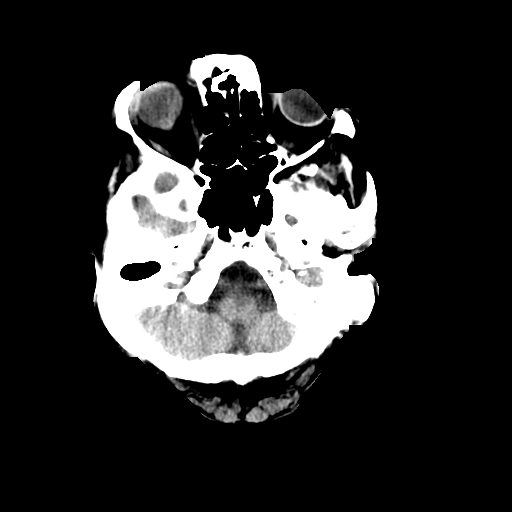
[im 6/28  brain]
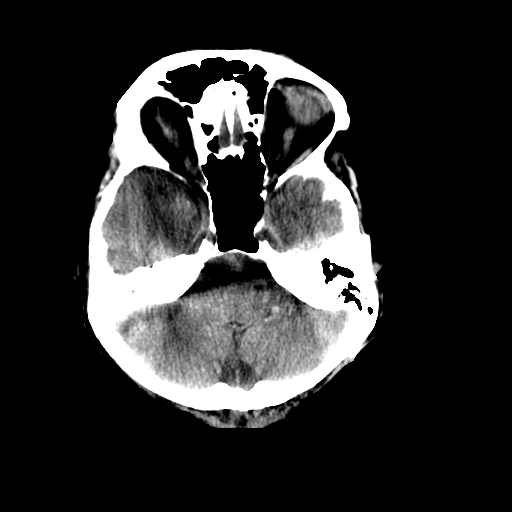
[im 8/28  brain]
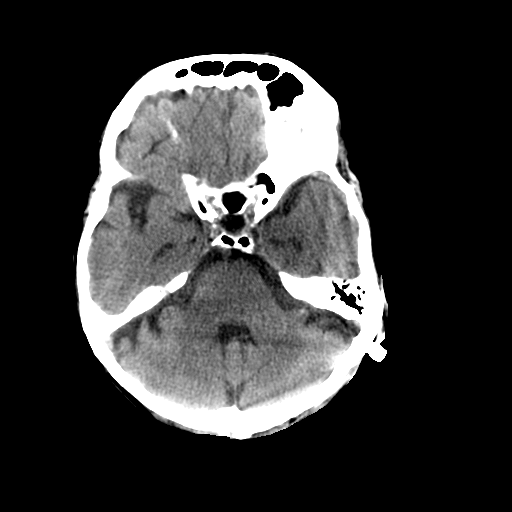
[im 10/28  brain]
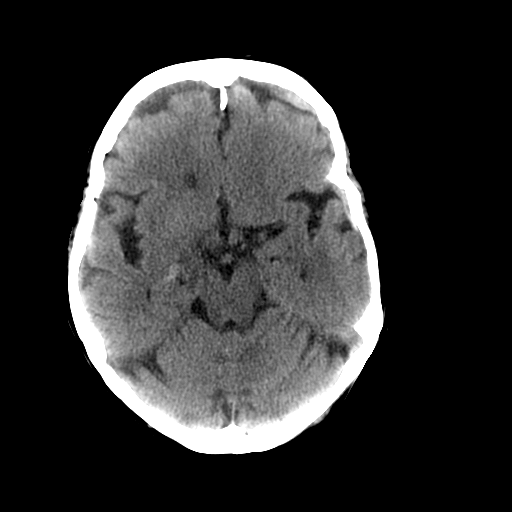
[im 10/28  bone]
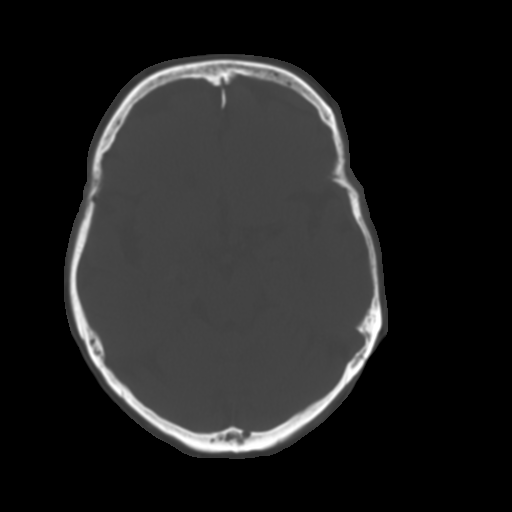
[im 12/28  brain]
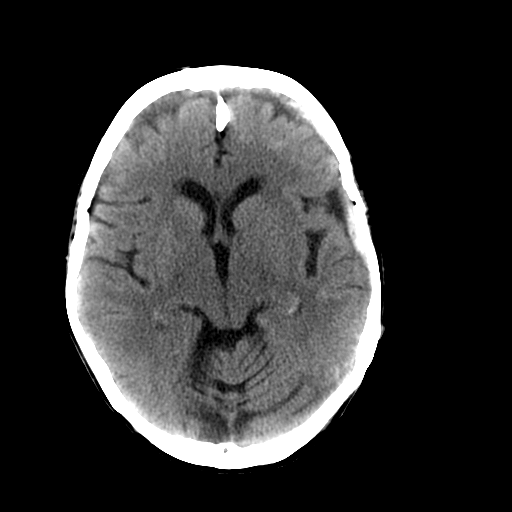
[im 14/28  brain]
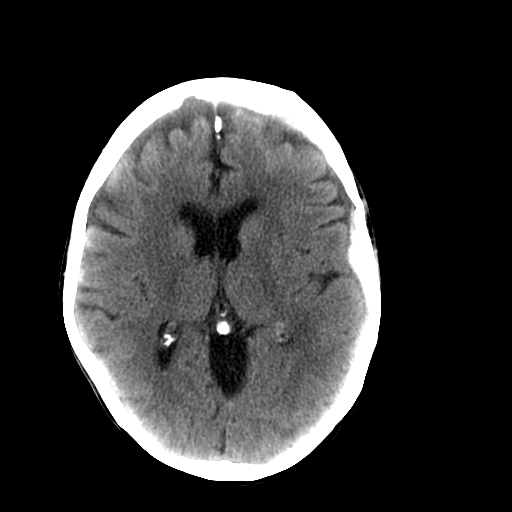
[im 16/28  brain]
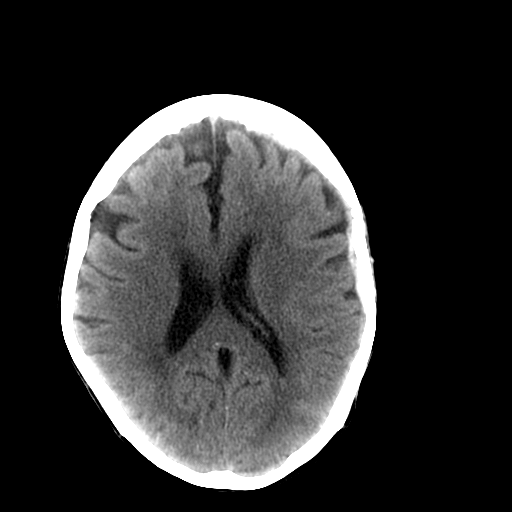
[im 18/28  brain]
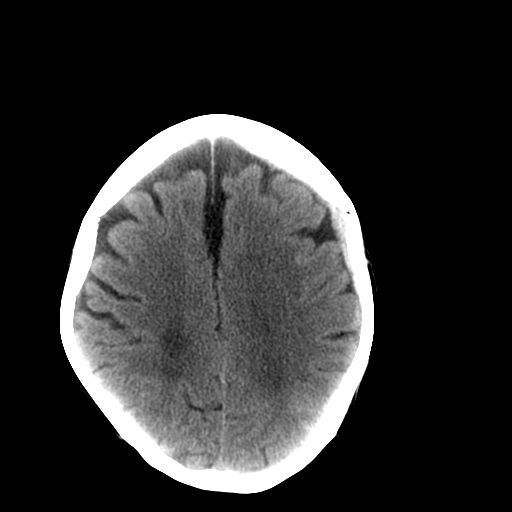
[im 18/28  bone]
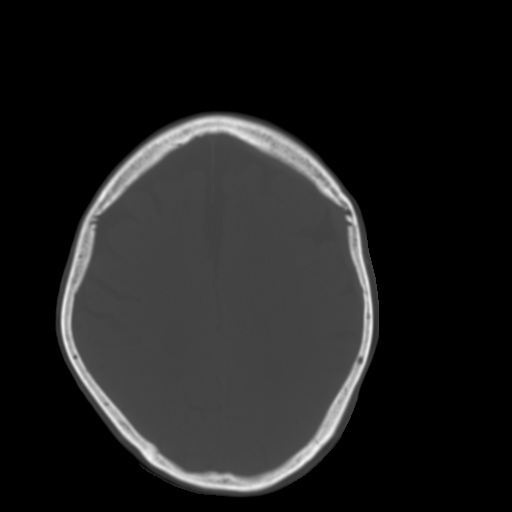
[im 20/28  brain]
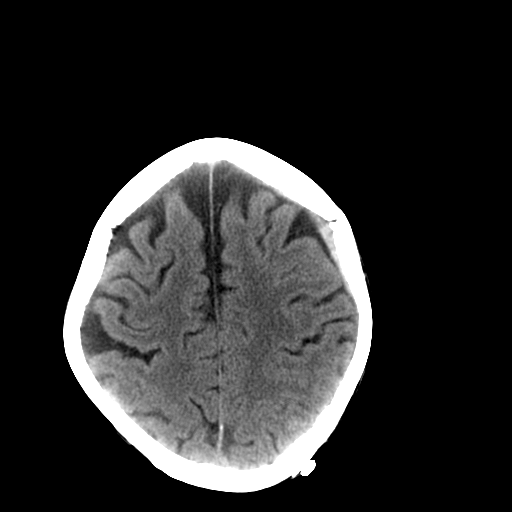
[im 22/28  brain]
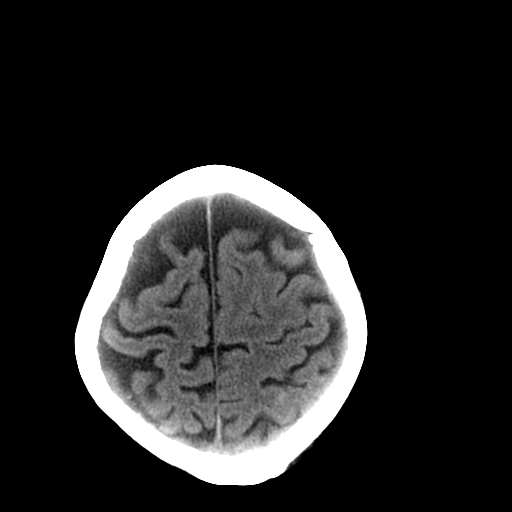
[im 24/28  brain]
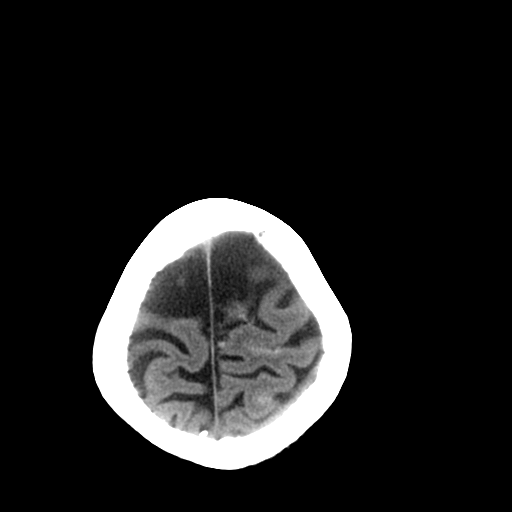
[im 26/28  brain]
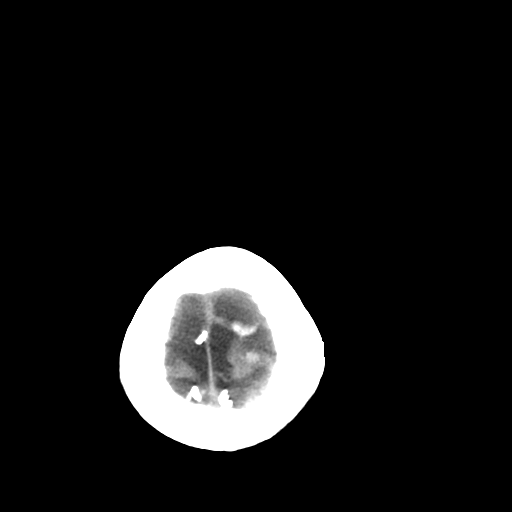
[im 26/28  bone]
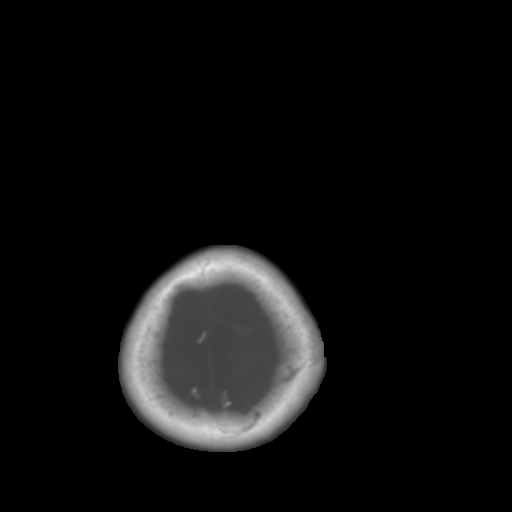

[13 of 30 positions shown; findings below may reference images not displayed]

FINDINGS: Slight increased amount of subarachnoid blood within the
left hemisphere.

Interval development of left-sided subdural hematoma, broad-based
and measuring up to 6.6 mm in maximal thickness.  Mild local mass
effect.

Small vessel disease type changes. No CT evidence of large acute
infarct.. No intracranial mass lesion detected on this unenhanced
exam..

Opacification right mastoid air cells.

Screw like metallic structure posterior to the left mastoid air
cells unchanged.
IMPRESSION: Slight increased amount of subarachnoid blood within the left
hemisphere.

Interval development of left-sided subdural hematoma, broad-based
and measuring up to 6.6 mm in maximal thickness.  Mild local mass
effect.

Critical Value/emergent results were called by telephone at the
time of interpretation on 12/15/2011  at [DATE] a.m.  to  [place]
patients nurse., who verbally acknowledged these results.

## 2013-12-27 IMAGING — CT CT HEAD W/O CM
2 series · 15 of 30 positions shown, 19 images · non-contrast
Comparison: 12/27/2011 and earlier.

CLINICAL DATA: 85-year-old female status post fall with traumatic
intracranial hemorrhage.  Left lower extremity weakness.

CT HEAD WITHOUT CONTRAST
TECHNIQUE: Contiguous axial images were obtained from the base of
the skull through the vertex without contrast.

[Series 2: head w/o · axial · non-contrast · 0.49mm/px · z∈[+135,+265]mm · 13 of 32 slices shown, 17 images]
[im 3/32  brain]
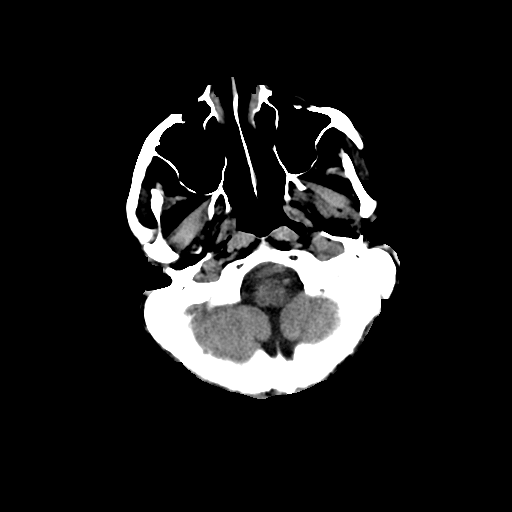
[im 3/32  bone]
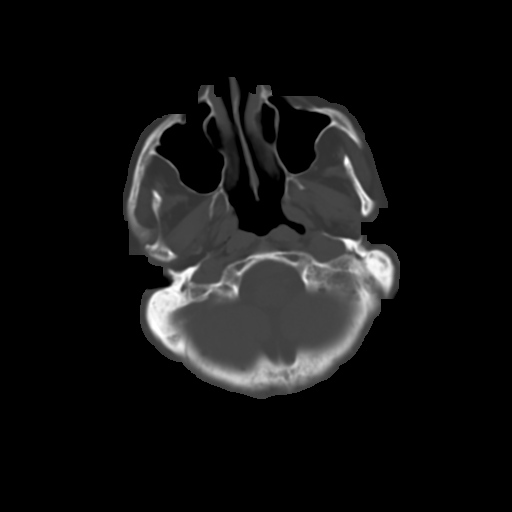
[im 5/32  brain]
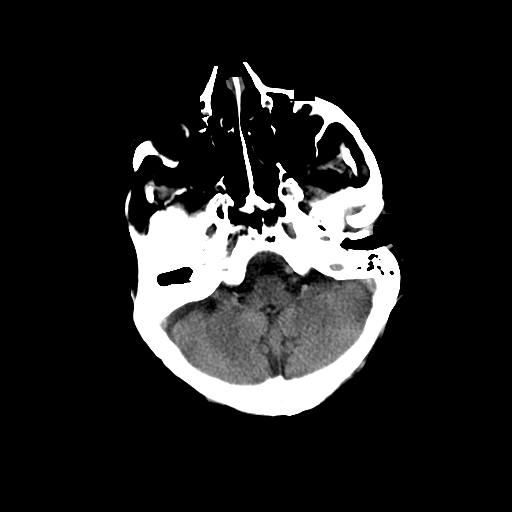
[im 7/32  brain]
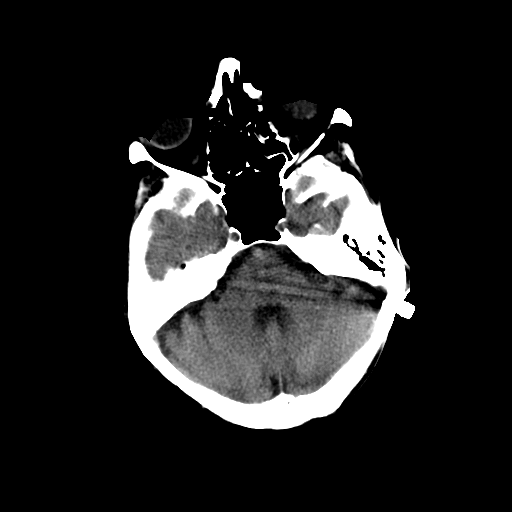
[im 9/32  brain]
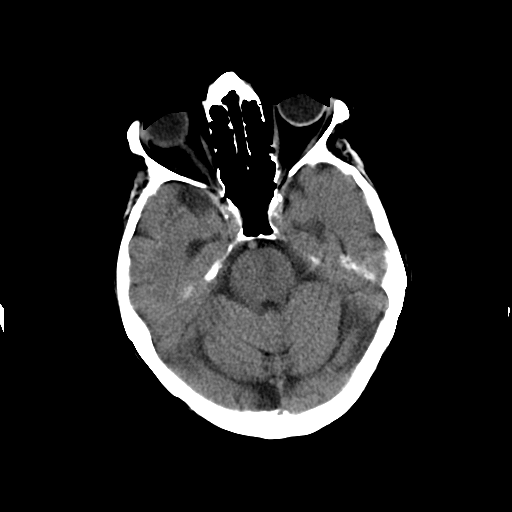
[im 12/32  brain]
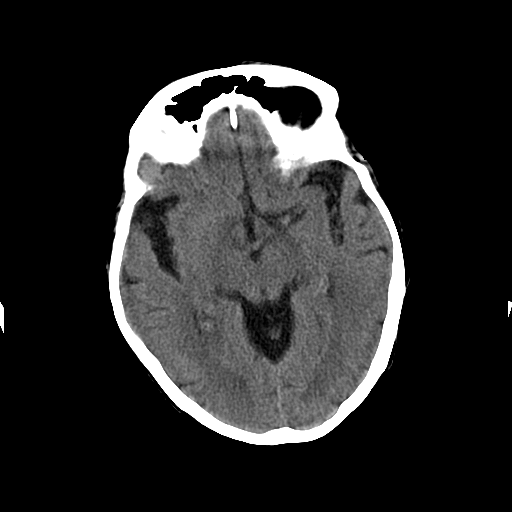
[im 12/32  bone]
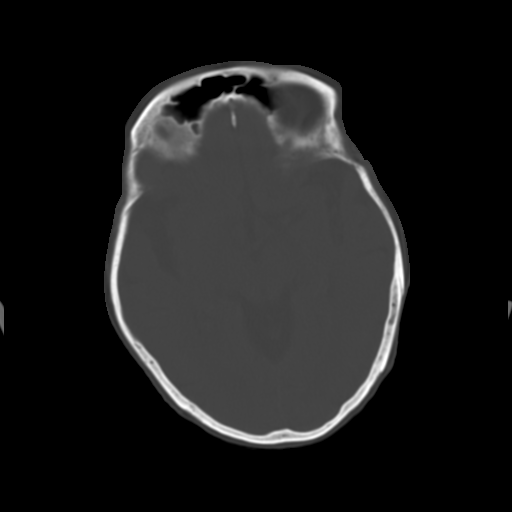
[im 14/32  brain]
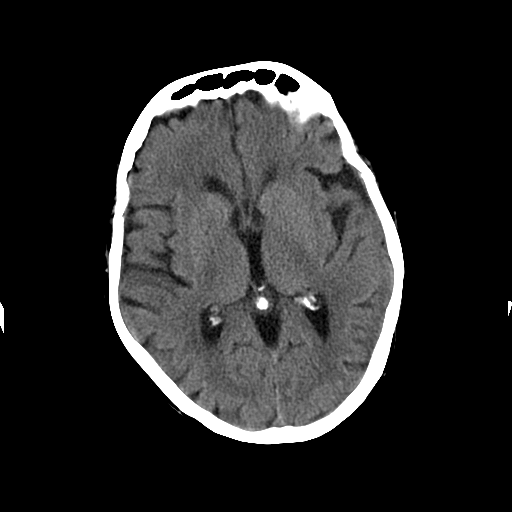
[im 16/32  brain]
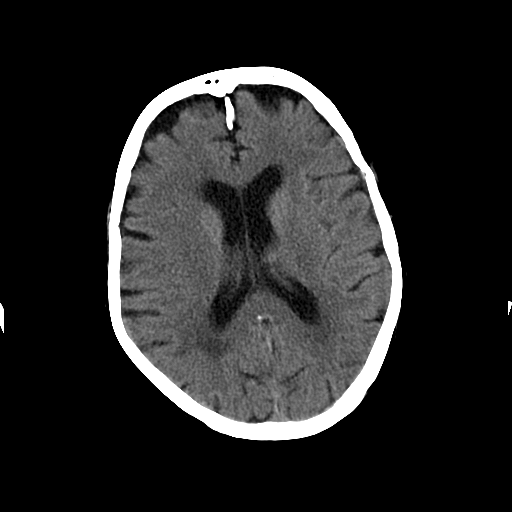
[im 18/32  brain]
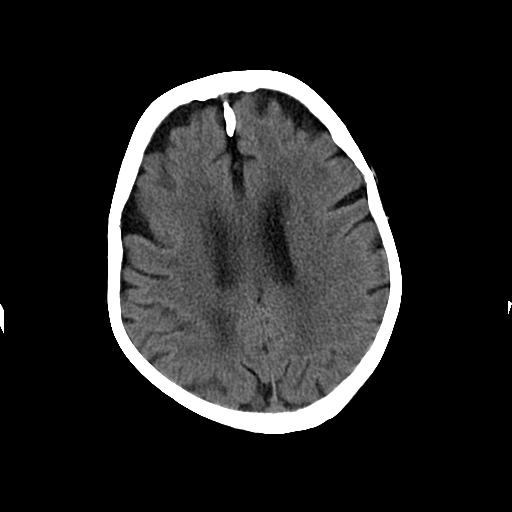
[im 20/32  brain]
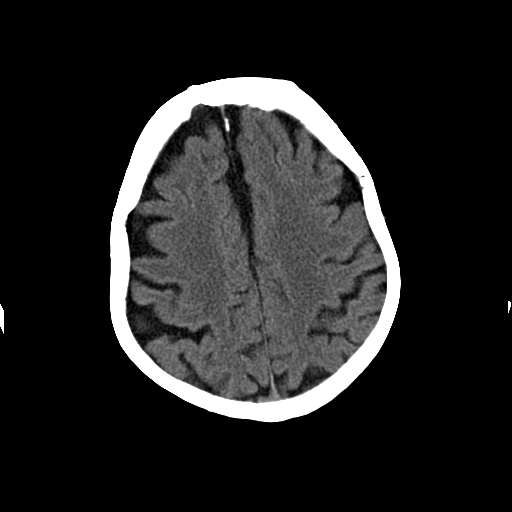
[im 20/32  bone]
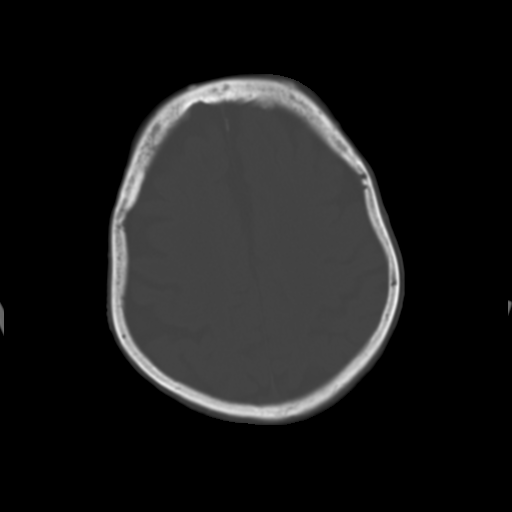
[im 23/32  brain]
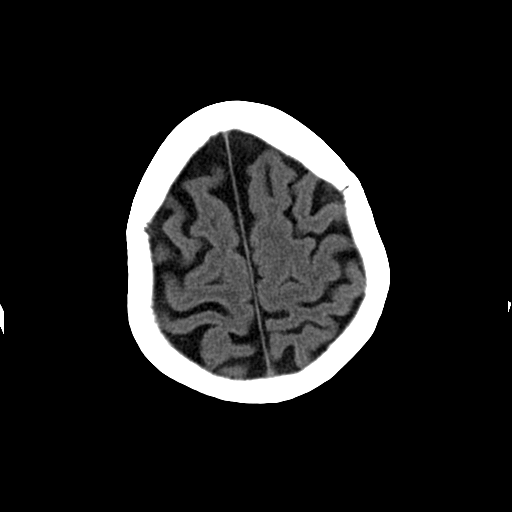
[im 25/32  brain]
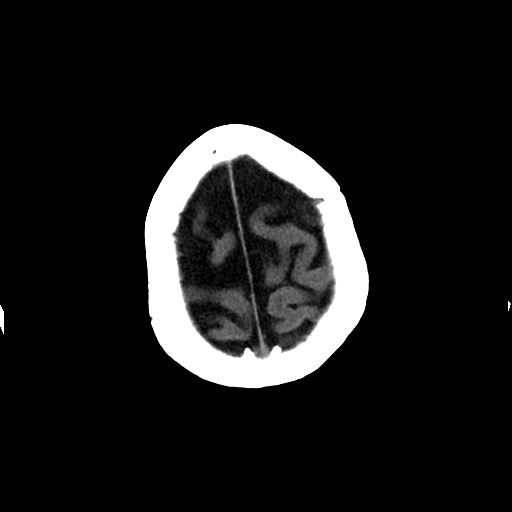
[im 27/32  brain]
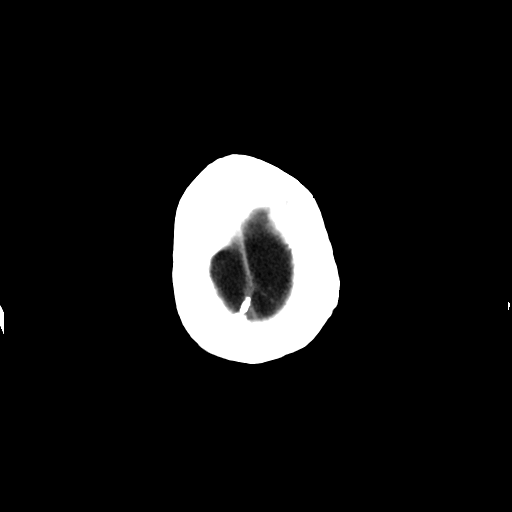
[im 29/32  brain]
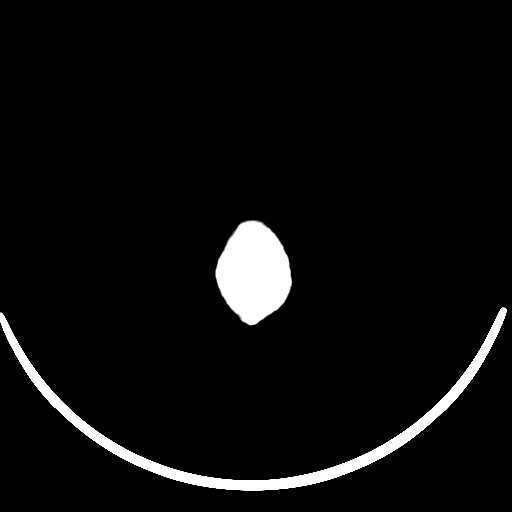
[im 29/32  bone]
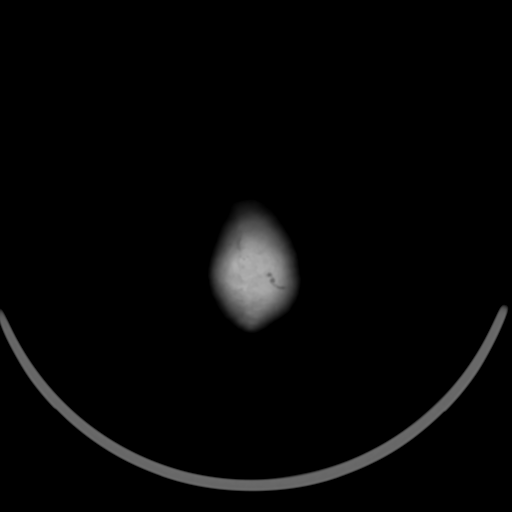

[Series 3: head w/o bone · axial · non-contrast · 0.49mm/px · z∈[+135,+155]mm · 2 of 32 slices shown]
[im 3/32  bone]
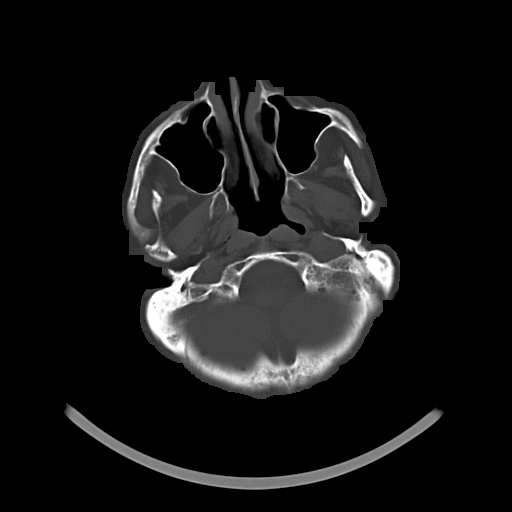
[im 7/32  bone]
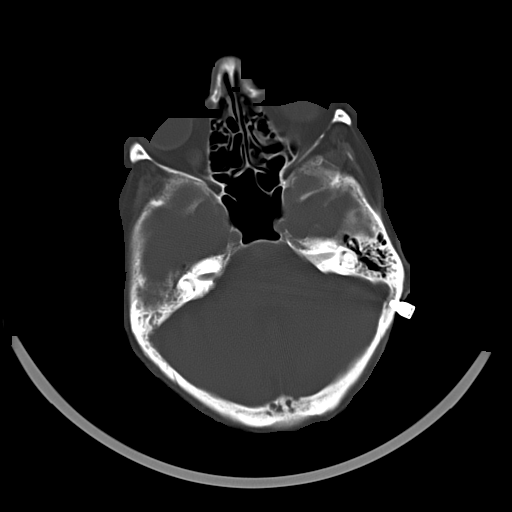

[15 of 30 positions shown; findings below may reference images not displayed]

FINDINGS: Visualized paranasal sinuses and mastoids are stable,
chronic right mastoid opacification.  Stable visualized osseous
structures.  Bone anchor device re-identified posterior to the left
mastoids. No acute orbit or scalp soft tissue findings.

No ventriculomegaly or intraventricular hemorrhage.  No extra-axial
hemorrhage.  No new intracranial hemorrhage. No midline shift, mass
effect, or evidence of mass lesion.  Stable patchy cerebral white
matter hypodensity. No evidence of cortically based acute
infarction identified.  No suspicious intracranial vascular
hyperdensity.  With
IMPRESSION: No acute findings.  Stable nonspecific white matter changes.  No
residual or recurrent intracranial hemorrhage.
